# Patient Record
Sex: Female | Born: 1975 | Race: White | Hispanic: No | Marital: Married | Smoking: Never smoker
Health system: Southern US, Community
[De-identification: ages and names within clinical notes are randomized; demographics above are authoritative.]

## PROBLEM LIST (undated history)

## (undated) DIAGNOSIS — M5020 Other cervical disc displacement, unspecified cervical region: Secondary | ICD-10-CM

## (undated) DIAGNOSIS — F419 Anxiety disorder, unspecified: Secondary | ICD-10-CM

## (undated) DIAGNOSIS — G5601 Carpal tunnel syndrome, right upper limb: Secondary | ICD-10-CM

## (undated) DIAGNOSIS — F988 Other specified behavioral and emotional disorders with onset usually occurring in childhood and adolescence: Secondary | ICD-10-CM

## (undated) DIAGNOSIS — F32A Depression, unspecified: Secondary | ICD-10-CM

## (undated) DIAGNOSIS — L4050 Arthropathic psoriasis, unspecified: Secondary | ICD-10-CM

## (undated) DIAGNOSIS — F909 Attention-deficit hyperactivity disorder, unspecified type: Secondary | ICD-10-CM

## (undated) DIAGNOSIS — R011 Cardiac murmur, unspecified: Secondary | ICD-10-CM

## (undated) DIAGNOSIS — Z86718 Personal history of other venous thrombosis and embolism: Secondary | ICD-10-CM

## (undated) DIAGNOSIS — M5030 Other cervical disc degeneration, unspecified cervical region: Secondary | ICD-10-CM

## (undated) DIAGNOSIS — L309 Dermatitis, unspecified: Secondary | ICD-10-CM

## (undated) DIAGNOSIS — M47812 Spondylosis without myelopathy or radiculopathy, cervical region: Secondary | ICD-10-CM

## (undated) DIAGNOSIS — F329 Major depressive disorder, single episode, unspecified: Secondary | ICD-10-CM

## (undated) DIAGNOSIS — T8859XA Other complications of anesthesia, initial encounter: Secondary | ICD-10-CM

## (undated) DIAGNOSIS — E039 Hypothyroidism, unspecified: Secondary | ICD-10-CM

## (undated) DIAGNOSIS — J45990 Exercise induced bronchospasm: Secondary | ICD-10-CM

## (undated) DIAGNOSIS — M2560 Stiffness of unspecified joint, not elsewhere classified: Secondary | ICD-10-CM

## (undated) DIAGNOSIS — K219 Gastro-esophageal reflux disease without esophagitis: Secondary | ICD-10-CM

## (undated) DIAGNOSIS — T4145XA Adverse effect of unspecified anesthetic, initial encounter: Secondary | ICD-10-CM

## (undated) HISTORY — DX: Other specified behavioral and emotional disorders with onset usually occurring in childhood and adolescence: F98.8

## (undated) HISTORY — PX: AUGMENTATION MAMMAPLASTY: SUR837

## (undated) HISTORY — PX: COLONOSCOPY: SHX174

## (undated) HISTORY — DX: Depression, unspecified: F32.A

## (undated) HISTORY — DX: Anxiety disorder, unspecified: F41.9

## (undated) HISTORY — DX: Major depressive disorder, single episode, unspecified: F32.9

## (undated) HISTORY — DX: Gastro-esophageal reflux disease without esophagitis: K21.9

---
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
## 1997-07-08 HISTORY — PX: BREAST ENHANCEMENT SURGERY: SHX7

## 1999-05-01 ENCOUNTER — Other Ambulatory Visit: Admission: RE | Admit: 1999-05-01 | Discharge: 1999-05-01 | Payer: Self-pay | Admitting: Obstetrics and Gynecology

## 2000-06-27 ENCOUNTER — Ambulatory Visit (HOSPITAL_COMMUNITY): Admission: RE | Admit: 2000-06-27 | Discharge: 2000-06-27 | Payer: Self-pay | Admitting: Gastroenterology

## 2003-04-04 ENCOUNTER — Ambulatory Visit (HOSPITAL_COMMUNITY): Admission: RE | Admit: 2003-04-04 | Discharge: 2003-04-04 | Payer: Self-pay | Admitting: Gastroenterology

## 2003-11-17 ENCOUNTER — Other Ambulatory Visit: Admission: RE | Admit: 2003-11-17 | Discharge: 2003-11-17 | Payer: Self-pay | Admitting: Obstetrics and Gynecology

## 2005-03-07 ENCOUNTER — Other Ambulatory Visit: Admission: RE | Admit: 2005-03-07 | Discharge: 2005-03-07 | Payer: Self-pay | Admitting: Obstetrics and Gynecology

## 2005-03-07 ENCOUNTER — Ambulatory Visit: Payer: Self-pay | Admitting: Oncology

## 2008-04-21 ENCOUNTER — Encounter (INDEPENDENT_AMBULATORY_CARE_PROVIDER_SITE_OTHER): Payer: Self-pay | Admitting: *Deleted

## 2008-05-17 ENCOUNTER — Ambulatory Visit: Payer: Self-pay | Admitting: Family Medicine

## 2008-05-17 DIAGNOSIS — K219 Gastro-esophageal reflux disease without esophagitis: Secondary | ICD-10-CM | POA: Insufficient documentation

## 2008-05-17 DIAGNOSIS — F988 Other specified behavioral and emotional disorders with onset usually occurring in childhood and adolescence: Secondary | ICD-10-CM | POA: Insufficient documentation

## 2008-06-06 ENCOUNTER — Encounter: Payer: Self-pay | Admitting: Family Medicine

## 2008-07-05 ENCOUNTER — Telehealth (INDEPENDENT_AMBULATORY_CARE_PROVIDER_SITE_OTHER): Payer: Self-pay | Admitting: *Deleted

## 2008-07-08 DIAGNOSIS — Z86718 Personal history of other venous thrombosis and embolism: Secondary | ICD-10-CM

## 2008-07-08 HISTORY — DX: Personal history of other venous thrombosis and embolism: Z86.718

## 2008-07-11 ENCOUNTER — Ambulatory Visit: Payer: Self-pay | Admitting: Family Medicine

## 2008-07-11 DIAGNOSIS — G5600 Carpal tunnel syndrome, unspecified upper limb: Secondary | ICD-10-CM | POA: Insufficient documentation

## 2008-08-03 ENCOUNTER — Telehealth: Payer: Self-pay | Admitting: Family Medicine

## 2008-08-31 ENCOUNTER — Telehealth: Payer: Self-pay | Admitting: Family Medicine

## 2008-09-19 ENCOUNTER — Encounter: Payer: Self-pay | Admitting: Family Medicine

## 2008-09-19 ENCOUNTER — Telehealth: Payer: Self-pay | Admitting: Family Medicine

## 2008-09-19 DIAGNOSIS — N390 Urinary tract infection, site not specified: Secondary | ICD-10-CM | POA: Insufficient documentation

## 2008-09-19 LAB — CONVERTED CEMR LAB
Bilirubin Urine: NEGATIVE
Glucose, Urine, Semiquant: NEGATIVE
WBC Urine, dipstick: NEGATIVE

## 2008-09-21 ENCOUNTER — Telehealth: Payer: Self-pay | Admitting: Family Medicine

## 2008-09-21 ENCOUNTER — Encounter: Payer: Self-pay | Admitting: Family Medicine

## 2008-09-22 ENCOUNTER — Encounter: Payer: Self-pay | Admitting: Family Medicine

## 2008-10-17 ENCOUNTER — Encounter: Payer: Self-pay | Admitting: Family Medicine

## 2008-10-26 ENCOUNTER — Telehealth: Payer: Self-pay | Admitting: Family Medicine

## 2008-11-03 ENCOUNTER — Encounter: Payer: Self-pay | Admitting: Family Medicine

## 2008-11-17 ENCOUNTER — Telehealth: Payer: Self-pay | Admitting: Family Medicine

## 2008-12-26 ENCOUNTER — Encounter: Payer: Self-pay | Admitting: Family Medicine

## 2008-12-26 ENCOUNTER — Telehealth: Payer: Self-pay | Admitting: Family Medicine

## 2008-12-26 ENCOUNTER — Ambulatory Visit: Payer: Self-pay

## 2008-12-26 ENCOUNTER — Encounter: Payer: Self-pay | Admitting: Internal Medicine

## 2008-12-26 DIAGNOSIS — I82402 Acute embolism and thrombosis of unspecified deep veins of left lower extremity: Secondary | ICD-10-CM | POA: Insufficient documentation

## 2008-12-26 DIAGNOSIS — M79609 Pain in unspecified limb: Secondary | ICD-10-CM | POA: Insufficient documentation

## 2008-12-26 DIAGNOSIS — I82409 Acute embolism and thrombosis of unspecified deep veins of unspecified lower extremity: Secondary | ICD-10-CM | POA: Insufficient documentation

## 2008-12-26 LAB — CONVERTED CEMR LAB
HCT: 37.6 % (ref 36.0–46.0)
Hemoglobin: 13 g/dL (ref 12.0–15.0)
Homocysteine: 7.1 micromoles/L (ref 4.0–15.4)
MCHC: 34.6 g/dL (ref 30.0–36.0)
Platelets: 253 10*3/uL (ref 150–400)
Protein S Ag, Total: 87 % (ref 70–140)
RDW: 13.1 % (ref 11.5–15.5)

## 2008-12-27 ENCOUNTER — Ambulatory Visit: Payer: Self-pay | Admitting: Cardiology

## 2008-12-27 ENCOUNTER — Encounter: Payer: Self-pay | Admitting: Family Medicine

## 2008-12-27 ENCOUNTER — Ambulatory Visit: Payer: Self-pay | Admitting: Internal Medicine

## 2008-12-27 ENCOUNTER — Telehealth (INDEPENDENT_AMBULATORY_CARE_PROVIDER_SITE_OTHER): Payer: Self-pay | Admitting: *Deleted

## 2008-12-29 ENCOUNTER — Telehealth: Payer: Self-pay | Admitting: Family Medicine

## 2008-12-30 ENCOUNTER — Ambulatory Visit: Payer: Self-pay | Admitting: Cardiology

## 2008-12-30 DIAGNOSIS — I80292 Phlebitis and thrombophlebitis of other deep vessels of left lower extremity: Secondary | ICD-10-CM | POA: Insufficient documentation

## 2008-12-30 DIAGNOSIS — I80299 Phlebitis and thrombophlebitis of other deep vessels of unspecified lower extremity: Secondary | ICD-10-CM | POA: Insufficient documentation

## 2008-12-30 LAB — CONVERTED CEMR LAB: Prothrombin Time: 12 s (ref 10.9–13.3)

## 2009-01-03 ENCOUNTER — Telehealth: Payer: Self-pay | Admitting: Family Medicine

## 2009-01-03 ENCOUNTER — Telehealth (INDEPENDENT_AMBULATORY_CARE_PROVIDER_SITE_OTHER): Payer: Self-pay | Admitting: *Deleted

## 2009-01-18 ENCOUNTER — Encounter: Admission: RE | Admit: 2009-01-18 | Discharge: 2009-01-18 | Payer: Self-pay | Admitting: Cardiology

## 2009-02-02 ENCOUNTER — Telehealth: Payer: Self-pay | Admitting: Family Medicine

## 2009-02-15 ENCOUNTER — Encounter: Admission: RE | Admit: 2009-02-15 | Discharge: 2009-02-15 | Payer: Self-pay | Admitting: Cardiology

## 2009-02-16 ENCOUNTER — Telehealth: Payer: Self-pay | Admitting: Family Medicine

## 2009-02-22 ENCOUNTER — Telehealth (INDEPENDENT_AMBULATORY_CARE_PROVIDER_SITE_OTHER): Payer: Self-pay | Admitting: *Deleted

## 2009-02-23 ENCOUNTER — Encounter: Payer: Self-pay | Admitting: Family Medicine

## 2009-03-10 ENCOUNTER — Telehealth: Payer: Self-pay | Admitting: Family Medicine

## 2009-03-14 ENCOUNTER — Telehealth (INDEPENDENT_AMBULATORY_CARE_PROVIDER_SITE_OTHER): Payer: Self-pay | Admitting: *Deleted

## 2009-03-21 ENCOUNTER — Telehealth: Payer: Self-pay | Admitting: Family Medicine

## 2009-03-24 ENCOUNTER — Telehealth: Payer: Self-pay | Admitting: Family Medicine

## 2009-03-25 ENCOUNTER — Telehealth: Payer: Self-pay | Admitting: Family Medicine

## 2009-04-11 ENCOUNTER — Ambulatory Visit: Payer: Self-pay | Admitting: Family Medicine

## 2009-05-18 ENCOUNTER — Ambulatory Visit: Payer: Self-pay | Admitting: Family Medicine

## 2009-05-18 LAB — CONVERTED CEMR LAB
ALT: 14 units/L (ref 0–35)
Cholesterol: 211 mg/dL — ABNORMAL HIGH (ref 0–200)
HDL: 119.6 mg/dL (ref >39.00)
Total Protein: 7 g/dL (ref 6.0–8.3)
Triglycerides: 32 mg/dL (ref 0.0–149.0)
VLDL: 6.4 mg/dL (ref 0.0–40.0)

## 2009-06-21 ENCOUNTER — Encounter: Payer: Self-pay | Admitting: Family Medicine

## 2009-06-27 ENCOUNTER — Telehealth: Payer: Self-pay | Admitting: Family Medicine

## 2009-07-25 ENCOUNTER — Encounter: Payer: Self-pay | Admitting: Family Medicine

## 2009-08-28 ENCOUNTER — Ambulatory Visit: Payer: Self-pay | Admitting: Family Medicine

## 2009-08-28 DIAGNOSIS — J019 Acute sinusitis, unspecified: Secondary | ICD-10-CM | POA: Insufficient documentation

## 2009-09-01 ENCOUNTER — Encounter: Payer: Self-pay | Admitting: Family Medicine

## 2009-09-01 ENCOUNTER — Encounter: Admission: RE | Admit: 2009-09-01 | Discharge: 2009-09-01 | Payer: Self-pay | Admitting: Cardiology

## 2009-09-05 ENCOUNTER — Encounter: Admission: RE | Admit: 2009-09-05 | Discharge: 2009-09-05 | Payer: Self-pay | Admitting: Cardiology

## 2009-09-16 ENCOUNTER — Encounter: Payer: Self-pay | Admitting: Family Medicine

## 2009-09-19 ENCOUNTER — Telehealth: Payer: Self-pay | Admitting: Family Medicine

## 2009-10-19 ENCOUNTER — Telehealth: Payer: Self-pay | Admitting: Family Medicine

## 2009-10-30 ENCOUNTER — Encounter: Payer: Self-pay | Admitting: Family Medicine

## 2009-11-15 ENCOUNTER — Telehealth: Payer: Self-pay | Admitting: Family Medicine

## 2009-12-22 ENCOUNTER — Telehealth: Payer: Self-pay | Admitting: Family Medicine

## 2010-01-22 ENCOUNTER — Telehealth: Payer: Self-pay | Admitting: Family Medicine

## 2010-02-05 ENCOUNTER — Ambulatory Visit: Payer: Self-pay | Admitting: Cardiology

## 2010-02-13 ENCOUNTER — Telehealth: Payer: Self-pay | Admitting: Family Medicine

## 2010-02-14 ENCOUNTER — Ambulatory Visit: Payer: Self-pay | Admitting: Family Medicine

## 2010-03-15 ENCOUNTER — Telehealth: Payer: Self-pay | Admitting: Family Medicine

## 2010-04-17 ENCOUNTER — Telehealth: Payer: Self-pay | Admitting: Family Medicine

## 2010-05-09 ENCOUNTER — Telehealth: Payer: Self-pay | Admitting: Family Medicine

## 2010-05-11 ENCOUNTER — Telehealth (INDEPENDENT_AMBULATORY_CARE_PROVIDER_SITE_OTHER): Payer: Self-pay | Admitting: Physician Assistant

## 2010-06-04 ENCOUNTER — Telehealth: Payer: Self-pay | Admitting: Family Medicine

## 2010-06-20 ENCOUNTER — Encounter: Payer: Self-pay | Admitting: Family Medicine

## 2010-07-06 ENCOUNTER — Telehealth: Payer: Self-pay | Admitting: Family Medicine

## 2010-07-13 ENCOUNTER — Ambulatory Visit
Admission: RE | Admit: 2010-07-13 | Discharge: 2010-07-13 | Payer: Self-pay | Source: Home / Self Care | Attending: Family Medicine | Admitting: Family Medicine

## 2010-07-13 ENCOUNTER — Telehealth: Payer: Self-pay | Admitting: Family Medicine

## 2010-07-30 ENCOUNTER — Telehealth: Payer: Self-pay | Admitting: Family Medicine

## 2010-08-05 LAB — CONVERTED CEMR LAB
HCT: 38.4 % (ref 36.0–46.0)
Hemoglobin: 13 g/dL (ref 12.0–15.0)
MCHC: 33.8 g/dL (ref 30.0–36.0)
MCV: 91.6 fL (ref 78.0–100.0)
RBC: 4.19 M/uL (ref 3.87–5.11)
RDW: 13.2 % (ref 11.5–15.5)
WBC: 7.8 10*3/uL (ref 4.0–10.5)

## 2010-08-07 NOTE — Progress Notes (Signed)
  Phone Note Call from Patient   Reason for Call: Refill Medication Summary of Call: pt needs vyvanse Initial call taken by: Loreen Freud DO,  January 22, 2010 10:23 AM    Prescriptions: VYVANSE 70 MG CAPS (LISDEXAMFETAMINE DIMESYLATE) 1 by mouth qam  #30 x 0   Entered and Authorized by:   Loreen Freud DO   Signed by:   Loreen Freud DO on 01/22/2010   Method used:   Print then Give to Patient   RxID:   [id number]

## 2010-08-07 NOTE — Letter (Signed)
Summary: [facility]  [facility]   Imported By: Lanelle Bal 11/14/2009 08:04:03  _____________________________________________________________________  External Attachment:    Type:   Image     Comment:   External Document

## 2010-08-07 NOTE — Progress Notes (Signed)
  Phone Note Call from Patient   Caller: Patient Call For: Loreen Freud DO Reason for Call: Refill Medication Summary of Call: PT NEEDS REFILL VYVANSE Initial call taken by: Loreen Freud DO,  September 19, 2009 8:51 AM  Follow-up for Phone Call        DONE-- PT WILL PICK UP TODAY IN BACK Follow-up by: Loreen Freud DO,  September 19, 2009 8:51 AM    Prescriptions: VYVANSE 70 MG CAPS (LISDEXAMFETAMINE DIMESYLATE) 1 by mouth qam  #30 x 0   Entered and Authorized by:   Loreen Freud DO   Signed by:   Loreen Freud DO on 09/19/2009   Method used:   Print then Give to Patient   RxID:   [id number]

## 2010-08-07 NOTE — Miscellaneous (Signed)
Summary: Orders Update  Clinical Lists Changes  Medications: Added new medication of HYDROQUINONE 4 % CREA (HYDROQUINONE) apply two times a day - Signed Rx of HYDROQUINONE 4 % CREA (HYDROQUINONE) apply two times a day;  #30g x 5;  Signed;  Entered by: Loreen Freud DO;  Authorized by: Loreen Freud DO;  Method used: Electronically to [facility]*, [address], [address], [place], [place]  [address], Ph: [phone], Fax: [phone]    Prescriptions: HYDROQUINONE 4 % CREA (HYDROQUINONE) apply two times a day  #30g x 5   Entered and Authorized by:   Loreen Freud DO   Signed by:   Loreen Freud DO on 09/16/2009   Method used:   Electronically to        [facility]* (retail)       [address]       [address]       [place], [place]  [address]       Ph: [phone]       Fax: [phone]   RxID:   [id number]

## 2010-08-07 NOTE — Progress Notes (Signed)
  Phone Note Call from Patient   Caller: Patient Call For: Loreen Freud DO Reason for Call: Refill Medication Summary of Call: pt need med refill  Initial call taken by: Loreen Freud DO,  May 09, 2010 11:02 AM    New/Updated Medications: VYVANSE 70 MG CAPS (LISDEXAMFETAMINE DIMESYLATE) 1 by mouth once daily Prescriptions: VYVANSE 70 MG CAPS (LISDEXAMFETAMINE DIMESYLATE) 1 by mouth once daily  #30 x 0   Entered and Authorized by:   Loreen Freud DO   Signed by:   Loreen Freud DO on 05/09/2010   Method used:   Print then Give to Patient   RxID:   [id number] ADDERALL XR 30 MG XR24H-CAP (AMPHETAMINE-DEXTROAMPHETAMINE) 1 by mouth qam  #30 x 0   Entered and Authorized by:   Loreen Freud DO   Signed by:   Loreen Freud DO on 05/09/2010   Method used:   Print then Give to Patient   RxID:   [id number]

## 2010-08-07 NOTE — Progress Notes (Signed)
  Phone Note Call from Patient   Caller: Patient Call For: Loreen Freud DO Reason for Call: Refill Medication Summary of Call: pt needs med refill Initial call taken by: Loreen Freud DO,  April 17, 2010 10:54 AM  Follow-up for Phone Call        adderall xr 30 1 by mouth once daily  Follow-up by: Loreen Freud DO,  April 17, 2010 10:57 AM    New/Updated Medications: ADDERALL XR 30 MG XR24H-CAP (AMPHETAMINE-DEXTROAMPHETAMINE) 1 by mouth qam Prescriptions: ADDERALL XR 30 MG XR24H-CAP (AMPHETAMINE-DEXTROAMPHETAMINE) 1 by mouth qam  #30 x 0   Entered and Authorized by:   Loreen Freud DO   Signed by:   Loreen Freud DO on 04/17/2010   Method used:   Print then Give to Patient   RxID:   [id number]

## 2010-08-07 NOTE — Assessment & Plan Note (Signed)
Summary: sinus infection/drb      Vital Signs:  Patient profile:   35 year old female Height:      64 inches Weight:      127 pounds BMI:     21.88 Temp:     98.3 degrees F oral BP sitting:   102 / 70  (left arm) Cuff size:   regular  Vitals Entered By: Army Fossa CMA (August 28, 2009 9:23 AM) CC: Pt c/o possible sinus infections, ears feel clogged, headache, nasal congestion, URI symptoms   History of Present Illness:       This is a 35 year old woman who presents with URI symptoms.  The symptoms began 1 week ago.  The patient complains of nasal congestion, purulent nasal discharge, sore throat, earache, and sick contacts, but denies clear nasal discharge, dry cough, and productive cough.  The patient denies fever, low-grade fever (<100.5 degrees), fever of 100.5-103 degrees, fever of 103.1-104 degrees, fever to >104 degrees, stiff neck, dyspnea, wheezing, rash, vomiting, diarrhea, use of an antipyretic, and response to antipyretic.  The patient also reports headache.  The patient denies itchy watery eyes, itchy throat, sneezing, seasonal symptoms, response to antihistamine, muscle aches, and severe fatigue.  The patient denies the following risk factors for Strep sinusitis: unilateral facial pain, unilateral nasal discharge, poor response to decongestant, double sickening, tooth pain, Strep exposure, tender adenopathy, and absence of cough.  B/L sinus pressure---front and max  Current Medications (verified): 1)  Vyvanse 70 Mg Caps (Lisdexamfetamine Dimesylate) .[name].. 1 By Mouth Qam 2)  Zegerid 40-1100 Mg Caps (Omeprazole-Sodium Bicarbonate) .... One Capsule Daily 3)  Urelle 81 Mg Tabs (Meth-Hyo-M Bl-Na Phos-Ph Sal) .... As Directed 4)  Lexapro 10 Mg Tabs (Escitalopram Oxalate) .[name].. 1 By Mouth Once Daily 5)  Warfarin Sodium 5 Mg Tabs (Warfarin Sodium) .... Use As Directed By Anticoagulation Clinic ([facility]) 6)  Allegra 180 Mg Tabs (Fexofenadine Hcl) .[name].. 1 By Mouth Once Daily As  Needed 7)  Valtrex 1 Gm Tabs (Valacyclovir Hcl) .... As Directed 8)  Veramyst 27.5 Mcg/spray Susp (Fluticasone Furoate) .... 2 Sprays Each Nostril Once Daily 9)  Lunesta 3 Mg Tabs (Eszopiclone) .[name].. 1 By Mouth At Bedtime As Needed 10)  Retin-A 0.1 % Crea (Tretinoin) .... Apply At Bedtime 11)  Diflucan 150 Mg Tabs (Fluconazole) .[name].. 1 By Mouth Once Daily --May Repeat in 3 Days Prn 12)  Augmentin 875-125 Mg Tabs (Amoxicillin-Pot Clavulanate) .[name].. 1 By Mouth Two Times A Day 13)  Mirena 20 Mcg/24hr Iud (Levonorgestrel) 14)  Prednisone 10 Mg Tabs (Prednisone) .... 3 By Mouth Once Daily For 3 Days Then 2 By Mouth Once Daily For 3 Days Then  1 By Mouth Once Daily For 3 Days  Allergies (verified): No Known Drug Allergies  Past History:  Past medical, surgical, family and social histories (including risk factors) reviewed for relevance to current acute and chronic problems.  Past Medical History: Reviewed history from 05/17/2008 and no changes required. GERD, hx hiatal hernia, hx of  Past Surgical History: Reviewed history from 05/17/2008 and no changes required. NONE  Family History: Reviewed history from 05/17/2008 and no changes required. Mother side: heart trouble, htn Father's side: cancer, colon cancer and breast cancer  Social History: Reviewed history from 05/17/2008 and no changes required. 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   Nose:  L frontal sinus tenderness, L maxillary sinus tenderness, R frontal sinus tenderness, and R maxillary sinus tenderness.   Mouth:  pharyngeal erythema and postnasal drip.   Neck:  cervical lymphadenopathy.   Lungs:  Normal respiratory effort, chest expands symmetrically. Lungs are  clear to auscultation, no crackles or wheezes. Heart:  Normal rate and regular rhythm. S1 and S2 normal without gallop, murmur, click, rub or other extra sounds. Extremities:  No clubbing, cyanosis, edema, or deformity noted with normal full range of motion of all joints.   Skin:  Intact without suspicious lesions or rashes Psych:  Oriented X3 and normally interactive.     Impression & Recommendations:  Problem # 1:  SINUSITIS - ACUTE-NOS (ICD-461.9)  Her updated medication list for this problem includes:    Veramyst 27.5 Mcg/spray Susp (Fluticasone furoate) .[name]... 2 sprays each nostril once daily    Augmentin 875-125 Mg Tabs (Amoxicillin-pot clavulanate) .[name]... 1 by mouth two times a day  Instructed on treatment. Call if symptoms persist or worsen.   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 1 by mouth qam 2)  Zegerid 40-1100 Mg Caps (Omeprazole-sodium bicarbonate) .... One capsule daily 3)  Urelle 81 Mg Tabs (Meth-hyo-m bl-na phos-ph sal) .... As directed 4)  Lexapro 10 Mg Tabs (Escitalopram oxalate) .[name].. 1 by mouth once daily 5)  Warfarin Sodium 5 Mg Tabs (Warfarin sodium) .... Use as directed by anticoagulation clinic ([facility]) 6)  Allegra 180 Mg Tabs (Fexofenadine hcl) .[name].. 1 by mouth once daily as needed 7)  Valtrex 1 Gm Tabs (Valacyclovir hcl) .... As directed 8)  Veramyst 27.5 Mcg/spray Susp (Fluticasone furoate) .... 2 sprays each nostril once daily 9)  Lunesta 3 Mg Tabs (Eszopiclone) .[name].. 1 by mouth at bedtime as needed 10)  Retin-a 0.1 % Crea (Tretinoin) .... Apply at bedtime 11)  Diflucan 150 Mg Tabs (Fluconazole) .[name].. 1 by mouth once daily --may repeat in 3 days prn 12)  Augmentin 875-125 Mg Tabs (Amoxicillin-pot clavulanate) .[name].. 1 by mouth two times a day 13)  Mirena 20  Mcg/24hr Iud (Levonorgestrel) 14)  Prednisone 10 Mg Tabs (Prednisone) .... 3 by mouth once daily for 3 days then 2 by mouth once daily for 3 days then  1 by mouth once daily for 3 days Prescriptions: VYVANSE 70 MG CAPS (LISDEXAMFETAMINE DIMESYLATE) 1 by mouth qam  #30 x 0   Entered and Authorized by:   Loreen Freud DO   Signed by:   Loreen Freud DO on 08/28/2009   Method used:   Print then Give to Patient   RxID:   [id number] PREDNISONE 10 MG TABS (PREDNISONE) 3 by mouth once daily for 3 days then 2 by mouth once daily for 3 days then  1 by mouth once daily for 3 days  #18 x 0   Entered and Authorized by:   Loreen Freud DO   Signed by:   Loreen Freud DO on 08/28/2009   Method used:   Print then Give to Patient   RxID:   [id number] AUGMENTIN 875-125 MG TABS (AMOXICILLIN-POT CLAVULANATE) 1 by mouth two times a day  #20 x 0   Entered and Authorized by:   Loreen Freud DO   Signed  by:   Loreen Freud DO on 08/28/2009   Method used:   Electronically to        [facility]* (retail)       [address]       [address]       [place], [place]  [address]       Ph: [phone]       Fax: [phone]   RxID:   [id number] DIFLUCAN 150 MG TABS (FLUCONAZOLE) 1 by mouth once daily --may repeat in 3 days prn  #2 x 2   Entered and Authorized by:   Loreen Freud DO   Signed by:   Loreen Freud DO on 08/28/2009   Method used:   Electronically to        [facility]* (retail)       [address]       [address]       [place], [place]  [address]       Ph: [phone]       Fax: [phone]   RxID:   [id number]    Medication Administration  Injection # 1:    Medication: Depo- Medrol 80mg     Diagnosis: SINUSITIS - ACUTE-NOS (ICD-461.9)    Route: IM    Site: RUOQ gluteus    Exp Date: 08/2010    Lot #: Madelaine Bhat    Mfr: novaplus    Patient tolerated injection without complications    Given by: Army Fossa CMA (August 28, 2009 9:51  AM)  Orders Added: 1)  Est. Patient Level III [88416] 2)  Admin of Therapeutic Inj  intramuscular or subcutaneous [96372] 3)  Depo- Medrol 80mg  [J1040]

## 2010-08-07 NOTE — Progress Notes (Signed)
  Phone Note Call from Patient   Call For: Dr Ronny Flurry Reason for Call: Lab or Test Results Summary of Call: Ms Elem checked her INR on home machine and it was 1.7. She takes 10mg  5 days/week and 7.5 2 days/week (Thur/Sun). She has a Hx of DVT in 2010 but not on most recent LE Dopplers 3-11. She is not having Sx of SOB/CP. LE is mildly swollen but not painfull. Advised her to take 10mg  daily except 7.5 on Thurs. She will check INR on Tues am and call results. Call back or call 911 for Sx. Initial call taken by: Park Breed PA-C,  May 11, 2010 7:11 PM

## 2010-08-07 NOTE — Progress Notes (Signed)
  Phone Note Call from Patient   Caller: Patient Call For: Loreen Freud DO Reason for Call: Refill Medication Summary of Call: refill   Follow-up for Phone Call        refill vyvanse Follow-up by: Loreen Freud DO,  June 04, 2010 10:13 AM    Prescriptions: VYVANSE 70 MG CAPS (LISDEXAMFETAMINE DIMESYLATE) 1 by mouth once daily  #30 x 0   Entered and Authorized by:   Loreen Freud DO   Signed by:   Loreen Freud DO on 06/04/2010   Method used:   Print then Give to Patient   RxID:   [id number]

## 2010-08-07 NOTE — Assessment & Plan Note (Signed)
Summary: med check/cbs   Vital Signs:  Patient profile:   35 year old female Height:      64 inches Weight:      126 pounds BMI:     21.71 Temp:     98.9 degrees F oral Pulse rate:   76 / minute BP sitting:   120 / 78  (left arm)  Vitals Entered By: Jeremy Johann CMA (February 14, 2010 10:05 AM) CC: MED CHECK   History of Present Illness: Pt here for ADD f/u.  Pt would like to switch back  to ritalin because vyvanse no longer works.  No other complaints.    Current Medications (verified): 1)  Ritalin 20 Mg Tabs (Methylphenidate Hcl) .[name].. 1 By Mouth Two Times A Day 2)  Zegerid 40-1100 Mg Caps (Omeprazole-Sodium Bicarbonate) .... One Capsule Daily 3)  Urelle 81 Mg Tabs (Meth-Hyo-M Bl-Na Phos-Ph Sal) .... As Directed 4)  Lexapro 10 Mg Tabs (Escitalopram Oxalate) .[name].. 1 By Mouth Once Daily 5)  Warfarin Sodium 5 Mg Tabs (Warfarin Sodium) .... Use As Directed By Anticoagulation Clinic ([facility]) 6)  Allegra 180 Mg Tabs (Fexofenadine Hcl) .[name].. 1 By Mouth Once Daily As Needed 7)  Valtrex 1 Gm Tabs (Valacyclovir Hcl) .... As Directed 8)  Veramyst 27.5 Mcg/spray Susp (Fluticasone Furoate) .... 2 Sprays Each Nostril Once Daily 9)  Lunesta 3 Mg Tabs (Eszopiclone) .[name].. 1 By Mouth At Bedtime As Needed 10)  Retin-A 0.1 % Crea (Tretinoin) .... Apply At Bedtime 11)  Diflucan 150 Mg Tabs (Fluconazole) .[name].. 1 By Mouth Once Daily --May Repeat in 3 Days Prn 12)  Mirena 20 Mcg/24hr Iud (Levonorgestrel) 13)  Hydroquinone 4 % Crea (Hydroquinone) .... Apply Two Times A Day 14)  Xanax 0.25 Mg Tabs (Alprazolam) .[name].. 1 By Mouth Three Times A Day As Needed  Allergies (verified): No Known Drug Allergies  Past History:  Past medical, surgical, family and social histories (including risk factors) reviewed for relevance to current acute and chronic problems.  Past Medical History: Reviewed history from 05/17/2008 and no changes required. GERD, hx hiatal hernia, hx of  Past Surgical History: Reviewed  history from 05/17/2008 and no changes required. NONE  Family History: Reviewed history from 05/17/2008 and no changes required. Mother side: heart trouble, htn Father's side: cancer, colon cancer and breast cancer  Social History: Reviewed history from 05/17/2008 and no changes required. 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 Lungs are clear to auscultation, no crackles or wheezes. Heart:  normal rate and no murmur.   Extremities:  No clubbing, cyanosis, edema, or deformity noted with normal full range of motion of all joints.   Skin:  Intact without suspicious lesions or rashes Psych:  Oriented X3 and normally interactive.     Impression & Recommendations:  Problem # 1:  ADD (ICD-314.00) change to ritalin  rto 6 months   Complete Medication List: 1)  Ritalin 20 Mg Tabs (Methylphenidate hcl) .[name].. 1 by mouth two times a day 2)  Zegerid 40-1100 Mg Caps (Omeprazole-sodium bicarbonate) .... One capsule daily 3)  Urelle 81 Mg Tabs (Meth-hyo-m bl-na phos-ph sal) .... As directed 4)  Lexapro 10 Mg Tabs (Escitalopram oxalate) .[name].. 1 by mouth once daily 5)  Warfarin Sodium 5 Mg Tabs (Warfarin sodium) .... Use  as directed by anticoagulation clinic ([facility]) 6)  Allegra 180 Mg Tabs (Fexofenadine hcl) .[name].. 1 by mouth once daily as needed 7)  Valtrex 1 Gm Tabs (Valacyclovir hcl) .... As directed 8)  Veramyst 27.5 Mcg/spray Susp (Fluticasone furoate) .... 2 sprays each nostril once daily 9)  Lunesta 3 Mg Tabs (Eszopiclone) .[name].. 1 by mouth at bedtime as needed 10)  Retin-a 0.1 % Crea (Tretinoin) .... Apply at bedtime 11)  Diflucan 150 Mg Tabs (Fluconazole) .[name].. 1 by mouth once daily --may repeat in 3 days  prn 12)  Mirena 20 Mcg/24hr Iud (Levonorgestrel) 13)  Hydroquinone 4 % Crea (Hydroquinone) .... Apply two times a day 14)  Xanax 0.25 Mg Tabs (Alprazolam) .[name].. 1 by mouth three times a day as needed

## 2010-08-07 NOTE — Letter (Signed)
Summary: [facility]  [facility]   Imported By: Lanelle Bal 07/31/2009 09:14:41  _____________________________________________________________________  External Attachment:    Type:   Image     Comment:   External Document

## 2010-08-07 NOTE — Miscellaneous (Signed)
Summary: Orders Update  Clinical Lists Changes  Medications: Rx of VYVANSE 70 MG CAPS (LISDEXAMFETAMINE DIMESYLATE) 1 by mouth qam;  #30 x 0;  Signed;  Entered by: Loreen Freud DO;  Authorized by: Loreen Freud DO;  Method used: Print then Give to Patient    Prescriptions: VYVANSE 70 MG CAPS (LISDEXAMFETAMINE DIMESYLATE) 1 by mouth qam  #30 x 0   Entered and Authorized by:   Loreen Freud DO   Signed by:   Loreen Freud DO on 07/25/2009   Method used:   Print then Give to Patient   RxID:   [id number]

## 2010-08-07 NOTE — Progress Notes (Signed)
  Phone Note Call from Patient   Caller: Patient Call For: Loreen Freud DO Reason for Call: Refill Medication Summary of Call: pt would like to switch to ritalin 20mg  two times a day  --- pt has appointment in am  Initial call taken by: Loreen Freud DO,  February 13, 2010 2:07 PM    New/Updated Medications: RITALIN 20 MG TABS (METHYLPHENIDATE HCL) 1 by mouth two times a day Prescriptions: RITALIN 20 MG TABS (METHYLPHENIDATE HCL) 1 by mouth two times a day  #60 x 0   Entered and Authorized by:   Loreen Freud DO   Signed by:   Loreen Freud DO on 02/13/2010   Method used:   Print then Give to Patient   RxID:   [id number]

## 2010-08-07 NOTE — Progress Notes (Signed)
  Phone Note Call from Patient   Caller: Patient Call For: Loreen Freud DO Reason for Call: Refill Medication Summary of Call: Needs vyvanse Initial call taken by: Loreen Freud DO,  Nov 15, 2009 9:29 AM  Follow-up for Phone Call        done Follow-up by: Loreen Freud DO,  Nov 15, 2009 9:29 AM    Prescriptions: VYVANSE 70 MG CAPS (LISDEXAMFETAMINE DIMESYLATE) 1 by mouth qam  #30 x 0   Entered and Authorized by:   Loreen Freud DO   Signed by:   Loreen Freud DO on 11/15/2009   Method used:   Print then Give to Patient   RxID:   [id number]

## 2010-08-07 NOTE — Progress Notes (Signed)
  Phone Note Refill Request    Follow-up for Phone Call        pt did not like ritalin--- adderall xr 30 xr  #30 1 by mouth once daily   Follow-up by: Loreen Freud DO,  March 15, 2010 11:43 AM    New/Updated Medications: ADDERALL XR 30 MG XR24H-CAP (AMPHETAMINE-DEXTROAMPHETAMINE) 1 by mouth once daily Prescriptions: ADDERALL XR 30 MG XR24H-CAP (AMPHETAMINE-DEXTROAMPHETAMINE) 1 by mouth once daily  #30 x 0   Entered and Authorized by:   Loreen Freud DO   Signed by:   Loreen Freud DO on 03/15/2010   Method used:   Print then Give to Patient   RxID:   [id number]

## 2010-08-07 NOTE — Progress Notes (Signed)
  Phone Note Call from Patient   Reason for Call: Refill Medication Summary of Call: pt needs refill vyvanse pt also under a lot of stress with parent's divorce ----xanax 0.25 three times a day as needed  Initial call taken by: Loreen Freud DO,  October 19, 2009 2:42 PM    New/Updated Medications: XANAX 0.25 MG TABS (ALPRAZOLAM) 1 by mouth three times a day as needed Prescriptions: XANAX 0.25 MG TABS (ALPRAZOLAM) 1 by mouth three times a day as needed  #60 x 0   Entered and Authorized by:   Loreen Freud DO   Signed by:   Loreen Freud DO on 10/19/2009   Method used:   Print then Give to Patient   RxID:   [id number] VYVANSE 70 MG CAPS (LISDEXAMFETAMINE DIMESYLATE) 1 by mouth qam  #30 x 0   Entered and Authorized by:   Loreen Freud DO   Signed by:   Loreen Freud DO on 10/19/2009   Method used:   Print then Give to Patient   RxID:   [id number]

## 2010-08-07 NOTE — Progress Notes (Signed)
  Phone Note Call from Patient   Caller: Patient Call For: Loreen Freud DO Summary of Call: refill on vyvanse Initial call taken by: Loreen Freud DO,  December 22, 2009 10:29 AM  Follow-up for Phone Call        printed and given to pt  Follow-up by: Loreen Freud DO,  December 22, 2009 10:31 AM    Prescriptions: VYVANSE 70 MG CAPS (LISDEXAMFETAMINE DIMESYLATE) 1 by mouth qam  #30 x 0   Entered and Authorized by:   Loreen Freud DO   Signed by:   Loreen Freud DO on 12/22/2009   Method used:   Print then Give to Patient   RxID:   [id number]

## 2010-08-09 NOTE — Progress Notes (Signed)
  Phone Note Call from Patient   Caller: Patient Call For: Dr Laury Axon Summary of Call: pt needs refill on med and would like Lovaza for health reasons.  She realizes her insurance will probably not pay for it.   Initial call taken by: Loreen Freud DO,  July 30, 2010 10:13 AM    New/Updated Medications: LOVAZA 1 GM CAPS (OMEGA-3-ACID ETHYL ESTERS) 2 by mouth two times a day Prescriptions: LOVAZA 1 GM CAPS (OMEGA-3-ACID ETHYL ESTERS) 2 by mouth two times a day  #120 x 5   Entered and Authorized by:   Loreen Freud DO   Signed by:   Loreen Freud DO on 07/30/2010   Method used:   Electronically to        [facility]* (retail)       [address]       [address]       [place], [place]  [address]       Ph: [phone]       Fax: [phone]   RxID:   [id number] VYVANSE 70 MG CAPS (LISDEXAMFETAMINE DIMESYLATE) 1 by mouth once daily  #30 x 0   Entered and Authorized by:   Loreen Freud DO   Signed by:   Loreen Freud DO on 07/30/2010   Method used:   Print then Give to Patient   RxID:   [id number]

## 2010-08-09 NOTE — Progress Notes (Signed)
  Phone Note Call from Patient   Caller: Patient Call For: Loreen Freud DO Reason for Call: Refill Medication Summary of Call: refill vyvanse--pt will schedule ov Initial call taken by: Loreen Freud DO,  July 06, 2010 2:17 PM  Follow-up for Phone Call        printed---pt to schedule appointment Follow-up by: Loreen Freud DO,  July 06, 2010 2:18 PM    Prescriptions: VYVANSE 70 MG CAPS (LISDEXAMFETAMINE DIMESYLATE) 1 by mouth once daily  #30 x 0   Entered and Authorized by:   Loreen Freud DO   Signed by:   Loreen Freud DO on 07/06/2010   Method used:   Print then Give to Patient   RxID:   [id number]

## 2010-08-09 NOTE — Assessment & Plan Note (Signed)
Summary: FLU SHOT//kp  Nurse Visit   Allergies: No Known Drug Allergies  Immunizations Administered:  Influenza Vaccine # 1:    Vaccine Type: Fluvax MCR    Site: right deltoid    Mfr: Sanofi Pasteur    Dose: 0.5 ml    Route: IM    Given by: Almeta Monas CMA (AAMA)    Exp. Date: 01/05/2011    Lot #: [id number]    VIS given: 01/30/10 version given July 13, 2010.  Flu Vaccine Consent Questions:    Do you have a history of severe allergic reactions to this vaccine? no    Any prior history of allergic reactions to egg and/or gelatin? no    Do you have a sensitivity to the preservative Thimersol? no    Do you have a past history of Guillan-Barre Syndrome? no    Do you currently have an acute febrile illness? no    Have you ever had a severe reaction to latex? no    Vaccine information given and explained to patient? yes    Are you currently pregnant? no  Orders Added: 1)  Influenza Vaccine MCR [00025]

## 2010-08-09 NOTE — Progress Notes (Signed)
  Phone Note Call from Patient   Caller: Patient Call For: lowne Summary of Call: Pt needs flu shot for work---pt wants to know if we have any more.  I told her it was ok to come in and get one as long as we still had them. Initial call taken by: Loreen Freud DO,  July 13, 2010 1:14 PM  Follow-up for Phone Call        DONE.... Almeta Monas CMA Duncan Dull)  July 13, 2010 1:52 PM

## 2010-08-15 ENCOUNTER — Encounter (INDEPENDENT_AMBULATORY_CARE_PROVIDER_SITE_OTHER): Payer: Managed Care, Other (non HMO) | Admitting: Family Medicine

## 2010-08-15 ENCOUNTER — Encounter: Payer: Self-pay | Admitting: Family Medicine

## 2010-08-15 ENCOUNTER — Other Ambulatory Visit: Payer: Self-pay | Admitting: Family Medicine

## 2010-08-15 DIAGNOSIS — Z978 Presence of other specified devices: Secondary | ICD-10-CM | POA: Insufficient documentation

## 2010-08-15 DIAGNOSIS — E785 Hyperlipidemia, unspecified: Secondary | ICD-10-CM

## 2010-08-15 DIAGNOSIS — I82409 Acute embolism and thrombosis of unspecified deep veins of unspecified lower extremity: Secondary | ICD-10-CM

## 2010-08-15 DIAGNOSIS — J301 Allergic rhinitis due to pollen: Secondary | ICD-10-CM

## 2010-08-15 DIAGNOSIS — Z0000 Encounter for general adult medical examination without abnormal findings: Secondary | ICD-10-CM

## 2010-08-15 DIAGNOSIS — F988 Other specified behavioral and emotional disorders with onset usually occurring in childhood and adolescence: Secondary | ICD-10-CM

## 2010-08-15 LAB — BASIC METABOLIC PANEL
BUN: 14 mg/dL (ref 6–23)
Creatinine, Ser: 0.7 mg/dL (ref 0.4–1.2)
Glucose, Bld: 113 mg/dL — ABNORMAL HIGH (ref 70–99)
Sodium: 138 mEq/L (ref 135–145)

## 2010-08-15 LAB — CBC WITH DIFFERENTIAL/PLATELET
Basophils Absolute: 0 10*3/uL (ref 0.0–0.1)
Eosinophils Absolute: 0.1 10*3/uL (ref 0.0–0.7)
Eosinophils Relative: 1.7 % (ref 0.0–5.0)
Hemoglobin: 15 g/dL (ref 12.0–15.0)
Monocytes Absolute: 0.7 10*3/uL (ref 0.1–1.0)
Neutrophils Relative %: 62.4 % (ref 43.0–77.0)
Platelets: 277 10*3/uL (ref 150.0–400.0)
RBC: 4.67 Mil/uL (ref 3.87–5.11)
RDW: 12.9 % (ref 11.5–14.6)
WBC: 5.3 10*3/uL (ref 4.5–10.5)

## 2010-08-15 LAB — CONVERTED CEMR LAB
Blood in Urine, dipstick: NEGATIVE
Ketones, urine, test strip: NEGATIVE
Nitrite: NEGATIVE
Protein, U semiquant: NEGATIVE
Urobilinogen, UA: NEGATIVE
pH: 5

## 2010-08-15 LAB — LIPID PANEL
Cholesterol: 254 mg/dL — ABNORMAL HIGH (ref 0–200)
Total CHOL/HDL Ratio: 2
Triglycerides: 40 mg/dL (ref 0.0–149.0)

## 2010-08-15 LAB — HEPATIC FUNCTION PANEL
AST: 26 U/L (ref 0–37)
Albumin: 4.3 g/dL (ref 3.5–5.2)
Alkaline Phosphatase: 60 U/L (ref 39–117)
Bilirubin, Direct: 0.1 mg/dL (ref 0.0–0.3)
Total Bilirubin: 0.5 mg/dL (ref 0.3–1.2)

## 2010-08-23 NOTE — Assessment & Plan Note (Signed)
Summary: cpx/pt will be fasting/kn   Vital Signs:  Patient profile:   35 year old female Height:      64 inches Weight:      123.8 pounds BMI:     21.33 Pulse rate:   68 / minute Pulse rhythm:   regular BP sitting:   122 / 82  (right arm) Cuff size:   regular  Vitals Entered By: Almeta Monas CMA Duncan Dull) (August 15, 2010 8:47 AM) CC: CPX/Fasting--No pap   History of Present Illness: Pt here for cpe and labs.  Pt only complaint is some hot flashes on occassion.     Preventive Screening-Counseling & Management  Alcohol-Tobacco     Alcohol drinks/day: 3     Alcohol type: wine     Smoking Status: never     Passive Smoke Exposure: no  Caffeine-Diet-Exercise     Caffeine use/day: 0     Does Patient Exercise: yes     Type of exercise: walk     Times/week: 2  Hep-HIV-STD-Contraception     HIV Risk: no     Dental Visit-last 6 months yes     Dental Care Counseling: not indicated; dental care within six months     Sun Exposure-Excessive: occasionally  Safety-Violence-Falls     Seat Belt Use: 100      Sexual History:  currently monogamous.    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 One Capsule Daily 2)  Urelle 81 Mg Tabs (Meth-Hyo-M Bl-Na Phos-Ph Sal) .... As Directed 3)  Lexapro 10 Mg Tabs (Escitalopram Oxalate) .[name].. 1 By Mouth Once Daily 4)  Warfarin Sodium 5 Mg Tabs  (Warfarin Sodium) .... Use As Directed By Anticoagulation Clinic ([facility]) 5)  Allegra 180 Mg Tabs (Fexofenadine Hcl) .[name].. 1 By Mouth Once Daily As Needed 6)  Valtrex 1 Gm Tabs (Valacyclovir Hcl) .... As Directed 7)  Veramyst 27.5 Mcg/spray Susp (Fluticasone Furoate) .... 2 Sprays Each Nostril Once Daily 8)  Lunesta 3 Mg Tabs (Eszopiclone) .[name].. 1 By Mouth At Bedtime As Needed 9)  Retin-A 0.1 % Crea (Tretinoin) .... Apply At Bedtime 10)  Diflucan 150 Mg Tabs (Fluconazole) .[name].. 1 By Mouth Once Daily --May Repeat in 3 Days Prn 11)  Mirena 20 Mcg/24hr Iud (Levonorgestrel) 12)  Hydroquinone 4 % Crea (Hydroquinone) .... Apply Two Times A Day 13)  Xanax 0.25 Mg Tabs (Alprazolam) .[name].. 1 By Mouth Three Times A Day As Needed 14)  Vyvanse 70 Mg Caps (Lisdexamfetamine Dimesylate) .[name].. 1 By Mouth Once Daily 15)  Lovaza 1 Gm Caps (Omega-3-Acid Ethyl Esters) .... 2 By Mouth Two Times A Day  Current Medications (verified): 1)  Zegerid 40-1100 Mg Caps (Omeprazole-Sodium Bicarbonate) .... One Capsule Daily 2)  Urelle 81 Mg Tabs (Meth-Hyo-M Bl-Na Phos-Ph Sal) .... As Directed 3)  Coumadin 10 Mg Tabs (Warfarin Sodium) .... As Directed 4)  Allegra 180 Mg Tabs (Fexofenadine Hcl) .Marland KitchenMarland KitchenMarland Kitchen  1 By Mouth Once Daily As Needed 5)  Valtrex 1 Gm Tabs (Valacyclovir Hcl) .... As Directed 6)  Veramyst 27.5 Mcg/spray Susp (Fluticasone Furoate) .... 2 Sprays Each Nostril Once Daily 7)  Retin-A 0.1 % Crea (Tretinoin) .... Apply At Bedtime 8)  Diflucan 150 Mg Tabs (Fluconazole) .[name].. 1 By Mouth Once Daily --May Repeat in 3 Days Prn 9)  Mirena 20 Mcg/24hr Iud (Levonorgestrel) 10)  Hydroquinone 4 % Crea (Hydroquinone) .... Apply Two Times A Day 11)  Xanax 0.25 Mg Tabs (Alprazolam) .[name].. 1 By Mouth Three Times A Day As Needed 12)  Vyvanse 70 Mg Caps (Lisdexamfetamine Dimesylate) .[name].. 1 By Mouth Once Daily 13)  Lovaza 1 Gm Caps (Omega-3-Acid Ethyl Esters) .... 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 Mother side: heart trouble, htn Father's side: cancer, colon cancer and breast cancer Family History of Asthma Family History Hypertension Family History High cholesterol  Social History: Reviewed history from 05/17/2008 and no changes required. 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 Eyes:  Denies blurring, discharge, double vision, eye irritation, eye pain, halos, itching, light sensitivity, red eye, vision loss-1 eye, and vision loss-both eyes. ENT:  Denies decreased hearing, difficulty swallowing, ear discharge, earache, hoarseness, nasal congestion, nosebleeds, postnasal drainage, ringing in ears, sinus pressure, and sore throat. CV:  Denies bluish discoloration of lips or nails, chest pain or discomfort, difficulty breathing at night, difficulty breathing while lying down, fainting, fatigue, leg cramps with  exertion, lightheadness, near fainting, palpitations, shortness of breath with exertion, swelling of feet, swelling of hands, and weight gain. Resp:  Denies chest discomfort, chest pain with inspiration, cough, coughing up blood, excessive snoring, hypersomnolence, morning headaches, pleuritic, shortness of breath, sputum productive, and wheezing. GI:  Denies abdominal pain, bloody stools, change in bowel habits, constipation, dark tarry stools, diarrhea, excessive appetite, gas, hemorrhoids, indigestion, loss of appetite, nausea, vomiting, vomiting blood, and yellowish skin color. GU:  Denies abnormal vaginal bleeding, decreased libido, discharge, dysuria, genital sores, hematuria, incontinence, nocturia, urinary frequency, and urinary hesitancy. MS:  Denies joint pain, joint redness, joint swelling, loss of strength, low back pain, mid back pain, muscle aches, muscle , cramps, muscle weakness, stiffness, and thoracic pain. Derm:  Complains of flushing; denies changes in color of skin, changes in nail beds, dryness, excessive perspiration, hair loss, insect bite(s), itching, lesion(s), poor wound healing, and rash. Neuro:  Denies brief paralysis, difficulty with concentration, disturbances in coordination, falling down, headaches, inability to speak, memory loss, numbness, poor balance, seizures, sensation of room spinning, tingling, tremors, visual disturbances, and  weakness. Psych:  Denies alternate hallucination ( auditory/visual), anxiety, depression, easily angered, easily tearful, irritability, mental problems, panic attacks, sense of great danger, suicidal thoughts/plans, thoughts of violence, unusual visions or sounds, and thoughts /plans of harming others. Endo:  Denies cold intolerance, excessive hunger, excessive thirst, excessive urination, heat intolerance, polyuria, and weight change. Heme:  Denies abnormal bruising, bleeding, enlarge lymph nodes, fevers, pallor, and skin  discoloration. Allergy:  Denies hives or rash, itching eyes, persistent infections, seasonal allergies, and sneezing.  Physical Exam  General:  Well-developed,well-nourished,in no acute distress; alert,appropriate and cooperative throughout examination Head:  Normocephalic and atraumatic without obvious abnormalities. No apparent alopecia or balding. Eyes:  pupils equal, pupils round, pupils reactive to light, and no injection.   Ears:  External ear exam shows no significant lesions or deformities.  Otoscopic examination reveals clear canals, tympanic membranes are intact bilaterally without bulging, retraction, inflammation or discharge. Hearing is grossly normal bilaterally. Nose:  External nasal examination shows no deformity or inflammation. Nasal mucosa are pink and moist without lesions or exudates. Mouth:  Oral mucosa and oropharynx without lesions or exudates.  Teeth in good repair. Neck:  No deformities, masses, or tenderness noted. Breasts:  gyn Lungs:  Normal respiratory effort, chest expands symmetrically. Lungs are clear to auscultation, no crackles or wheezes. Heart:  normal rate and no murmur.   Abdomen:  Bowel sounds positive,abdomen soft and non-tender without masses, organomegaly or hernias noted. Genitalia:  gyn Msk:  normal ROM and no joint swelling.   Pulses:  R and L carotid,radial,femoral,dorsalis pedis and posterior tibial pulses are full and equal bilaterally Skin:  Intact without suspicious lesions or rashes Cervical Nodes:  No lymphadenopathy noted Axillary Nodes:  No palpable lymphadenopathy Psych:  Cognition and judgment appear intact. Alert and cooperative with normal attention span and concentration. 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 One capsule daily  Diagnostics Reviewed:  Discussed lifestyle modifications, diet, antacids/medications, and preventive measures. Handout provided.   Problem # 5:  ALLERGIC RHINITIS, SEASONAL (ICD-477.0) per allergists  Complete Medication List: 1)  Zegerid 40-1100 Mg Caps (Omeprazole-sodium bicarbonate) .... One capsule daily 2)  Urelle 81 Mg Tabs (Meth-hyo-m bl-na phos-ph sal) .... As directed 3)  Coumadin 10 Mg Tabs (Warfarin sodium) .... As directed 4)  Allegra 180 Mg Tabs (Fexofenadine hcl) .[name].. 1 by mouth once daily as needed 5)  Valtrex 1 Gm Tabs (Valacyclovir hcl) .... As directed 6)  Veramyst 27.5 Mcg/spray Susp (Fluticasone furoate) .... 2 sprays each nostril once daily 7)  Retin-a 0.1 % Crea (Tretinoin) .... Apply at bedtime 8)  Diflucan 150 Mg Tabs (Fluconazole) .[name].. 1 by mouth once daily --may repeat in 3 days prn 9)  Mirena 20 Mcg/24hr Iud (Levonorgestrel) 10)  Hydroquinone 4 % Crea (Hydroquinone) .... Apply two times a day 11)  Xanax 0.25 Mg Tabs (Alprazolam) .[name].. 1 by mouth three times a day as needed 12)  Vyvanse 70 Mg Caps (Lisdexamfetamine dimesylate) .Marland KitchenMarland KitchenMarland Kitchen  1 by mouth once daily 13)  Lovaza 1 Gm Caps (Omega-3-acid ethyl esters) .... 2 by mouth two times a day  Patient Instructions: 1)  Please schedule a follow-up appointment in 6 months .  Prescriptions: VYVANSE 70 MG CAPS (LISDEXAMFETAMINE DIMESYLATE) 1 by mouth once daily  #30 x 0   Entered and Authorized by:   Loreen Freud DO    Signed by:   Loreen Freud DO on 08/15/2010   Method used:   Print then Give to Patient   RxID:   [id number] VERAMYST 27.5 MCG/SPRAY SUSP (FLUTICASONE FUROATE) 2 sprays each nostril once daily  #1 x 11   Entered by:   Almeta Monas CMA (AAMA)   Authorized by:   Loreen Freud DO   Signed by:   Almeta Monas CMA (AAMA) on 08/15/2010   Method used:   Faxed to ...       [facility]* (retail)       [address]       [address]       [place], [place]  [address]       Ph: [phone]       Fax: [phone]   RxID:   [id number] VALTREX 1 GM TABS (VALACYCLOVIR HCL) as directed  #90 x 3   Entered by:   Almeta Monas CMA (AAMA)   Authorized by:   Loreen Freud DO   Signed by:   Almeta Monas CMA (AAMA) on 08/15/2010   Method used:   Faxed to ...       [facility]* (retail)       [address]       [address]       [place], [place]  [address]       Ph: [phone]       Fax: [phone]   RxID:   [id number]    Orders Added: 1)  Venipuncture [54270] 2)  TLB-Lipid Panel [80061-LIPID] 3)  TLB-BMP (Basic Metabolic Panel-BMET) [80048-METABOL] 4)  TLB-CBC Platelet - w/Differential [85025-CBCD] 5)  TLB-Hepatic/Liver Function Pnl [80076-HEPATIC] 6)  TLB-TSH (Thyroid Stimulating Hormone) [84443-TSH] 7)  Specimen Handling [99000] 8)  UA Dipstick W/ Micro (manual) [81000] 9)  Est. Patient 18-39 years [99395]    TD Result Date:  10/14/2007 TD Result:  given TD Next Due:  10 yr PAP Result Date:  06/20/2010 PAP Result:  normal PAP Next Due:  1 yr   Laboratory Results   Urine Tests   Date/Time Reported: August 15, 2010 1:04 PM   Routine Urinalysis   Color: yellow Appearance: Clear Glucose: negative   (Normal Range: Negative) Bilirubin: negative   (Normal Range: Negative) Ketone: negative   (Normal Range: Negative) Spec. Gravity: 1.020   (Normal Range: 1.003-1.035) Blood: negative   (Normal Range: Negative) pH: 5.0   (Normal  Range: 5.0-8.0) Protein: negative   (Normal Range: Negative) Urobilinogen: negative   (Normal Range: 0-1) Nitrite: negative   (Normal Range: Negative) Leukocyte Esterace: negative   (Normal Range: Negative)    Comments: Floydene Flock  August 15, 2010 1:04 PM

## 2010-09-01 ENCOUNTER — Encounter: Payer: Self-pay | Admitting: Family Medicine

## 2010-09-17 ENCOUNTER — Telehealth (INDEPENDENT_AMBULATORY_CARE_PROVIDER_SITE_OTHER): Payer: Self-pay | Admitting: *Deleted

## 2010-09-25 NOTE — Progress Notes (Signed)
Summary: Vyvanse refill  Phone Note Refill Request Message from:  Patient on September 17, 2010 3:03 PM  Refills Requested: Medication #1:  VYVANSE 70 MG CAPS 1 by mouth once daily    Prescriptions: VYVANSE 70 MG CAPS (LISDEXAMFETAMINE DIMESYLATE) 1 by mouth once daily  #30 x 0   Entered by:   Almeta Monas CMA (AAMA)   Authorized by:   Loreen Freud DO   Signed by:   Almeta Monas CMA (AAMA) on 09/17/2010   Method used:   Print then Give to Patient   RxID:   [id number]

## 2010-10-17 ENCOUNTER — Other Ambulatory Visit: Payer: Self-pay

## 2010-10-17 MED ORDER — LISDEXAMFETAMINE DIMESYLATE 70 MG PO CAPS: 70.0000 mg | ORAL_CAPSULE | ORAL | Status: DC

## 2010-10-23 ENCOUNTER — Encounter: Payer: Self-pay | Admitting: Cardiology

## 2010-10-23 LAB — PROTIME-INR

## 2010-10-26 ENCOUNTER — Ambulatory Visit (INDEPENDENT_AMBULATORY_CARE_PROVIDER_SITE_OTHER): Payer: Managed Care, Other (non HMO) | Admitting: *Deleted

## 2010-10-26 DIAGNOSIS — Z111 Encounter for screening for respiratory tuberculosis: Secondary | ICD-10-CM

## 2010-11-01 ENCOUNTER — Encounter: Payer: Self-pay | Admitting: Cardiology

## 2010-11-08 ENCOUNTER — Encounter: Payer: Self-pay | Admitting: Cardiology

## 2010-11-13 ENCOUNTER — Other Ambulatory Visit: Payer: Self-pay

## 2010-11-13 MED ORDER — LISDEXAMFETAMINE DIMESYLATE 70 MG PO CAPS: 70.0000 mg | ORAL_CAPSULE | ORAL | Status: DC

## 2010-11-13 NOTE — Telephone Encounter (Signed)
Pt in office to pick up RX----   KP

## 2010-11-22 LAB — POCT INR: INR: 1.9

## 2010-11-23 ENCOUNTER — Ambulatory Visit (INDEPENDENT_AMBULATORY_CARE_PROVIDER_SITE_OTHER): Payer: Managed Care, Other (non HMO) | Admitting: Cardiology

## 2010-11-23 DIAGNOSIS — D6859 Other primary thrombophilia: Secondary | ICD-10-CM | POA: Insufficient documentation

## 2010-11-23 DIAGNOSIS — I824Y9 Acute embolism and thrombosis of unspecified deep veins of unspecified proximal lower extremity: Secondary | ICD-10-CM

## 2010-11-23 NOTE — Op Note (Signed)
NAMEALEXZANDRA, BILTON                           ACCOUNT NO.:  [id number]   MEDICAL RECORD NO.:  [id number]                   PATIENT TYPE:  AMB   LOCATION:  ENDO                                 FACILITY:  [facility]   PHYSICIAN:  Anselmo Rod, M.D.               DATE OF BIRTH:  08/25/1975   DATE OF PROCEDURE:  04/04/2003  DATE OF DISCHARGE:                                 OPERATIVE REPORT   PROCEDURE PERFORMED:  Screening colonoscopy.   ENDOSCOPIST:  Charna Elizabeth, M.D.   INSTRUMENT USED:  Olympus adjustable pediatric video colonoscope changed to  adult scope.   INDICATIONS FOR PROCEDURE:  Rectal bleeding and family history of colon  cancer in a 35 year old white female with ongoing rectal bleeding in spite  of treatment of anal fissure.  Rule out colonic polyps, masses, hemorrhoids,  etc.   PREPROCEDURE PREPARATION:  Informed consent was procured from the patient.  The patient was fasted for eight hours prior to the procedure and prepped  with a bottle of magnesium citrate and a gallon of GoLYTELY the night prior  to the procedure.   PREPROCEDURE PHYSICAL:  The patient had stable vital signs.  Neck supple.  Chest clear to auscultation.  S1 and S2 regular.  Abdomen soft with normal  bowel sounds.   DESCRIPTION OF PROCEDURE:  The patient was placed in left lateral decubitus  position and sedated with 60 mg of Demerol and 6 mg of Versed in slow  incremental doses.  Once the patient was adequately sedated and maintained  on low flow oxygen and continuous cardiac monitoring, the Olympus video  pediatric colonoscope was advanced from the rectum to the hepatic flexure  with extreme difficulty.  There was constant looping of the scope.  The  pediatric adjustable colonoscope was withdrawn.  After attempting to reach  the cecum by applying abdominal pressure gently and changing the patient's  position from the left lateral to the supine and the right lateral  positions, the adult  colonoscope was used instead and the procedure was  complete up to the cecum and terminal ileum. The appendicular orifice and  ileocecal valve were clearly visualized and photographed.  No masses,  polyps, erosions, ulcerations or diverticula were seen.  Small internal  hemorrhoids were appreciated on retroflexion in the rectum.  The patient  tolerated the procedure well without complications.  The terminal ileum also  appeared healthy without lesions.   IMPRESSION:  1. Normal colonoscopy up to the terminal ileum except for small internal     hemorrhoids.  2. Repeat colorectal cancer screening is recommended in the next 10 years     unless the patient develops any abnormal symptoms in the interim.  3. Outpatient follow-up in the next two weeks or earlier if need be.  Repeat     anoscopy will be done at that time.  Anselmo Rod, M.D.    JNM/MEDQ  D:  04/04/2003  T:  04/04/2003  Job:  [id number]   cc:   Lucky Cowboy, M.D.  [address], [address]  [place], [place] [address]  Fax: [phone]

## 2010-11-28 ENCOUNTER — Encounter: Payer: Self-pay | Admitting: Cardiology

## 2010-12-07 ENCOUNTER — Ambulatory Visit: Payer: Self-pay | Admitting: *Deleted

## 2010-12-07 ENCOUNTER — Telehealth: Payer: Self-pay | Admitting: *Deleted

## 2010-12-07 ENCOUNTER — Encounter: Payer: Self-pay | Admitting: Cardiology

## 2010-12-07 NOTE — Telephone Encounter (Signed)
Opened in error

## 2010-12-18 ENCOUNTER — Other Ambulatory Visit: Payer: Self-pay

## 2010-12-18 MED ORDER — LISDEXAMFETAMINE DIMESYLATE 70 MG PO CAPS: 70.0000 mg | ORAL_CAPSULE | ORAL | Status: DC

## 2010-12-20 ENCOUNTER — Encounter: Payer: Self-pay | Admitting: Cardiology

## 2011-01-01 ENCOUNTER — Telehealth: Payer: Self-pay | Admitting: *Deleted

## 2011-01-01 NOTE — Telephone Encounter (Signed)
LMOM for pt to call back with her INR report; she performs this at home.

## 2011-01-02 ENCOUNTER — Ambulatory Visit: Payer: Self-pay | Admitting: *Deleted

## 2011-01-10 ENCOUNTER — Ambulatory Visit (INDEPENDENT_AMBULATORY_CARE_PROVIDER_SITE_OTHER): Payer: Self-pay | Admitting: *Deleted

## 2011-01-10 DIAGNOSIS — R0989 Other specified symptoms and signs involving the circulatory and respiratory systems: Secondary | ICD-10-CM

## 2011-01-11 ENCOUNTER — Encounter: Payer: Self-pay | Admitting: Cardiology

## 2011-01-17 ENCOUNTER — Other Ambulatory Visit: Payer: Self-pay | Admitting: Family Medicine

## 2011-01-17 DIAGNOSIS — F988 Other specified behavioral and emotional disorders with onset usually occurring in childhood and adolescence: Secondary | ICD-10-CM

## 2011-01-17 MED ORDER — LISDEXAMFETAMINE DIMESYLATE 70 MG PO CAPS: 70.0000 mg | ORAL_CAPSULE | ORAL | Status: DC

## 2011-01-22 ENCOUNTER — Ambulatory Visit: Payer: Self-pay | Admitting: *Deleted

## 2011-01-23 ENCOUNTER — Encounter: Payer: Self-pay | Admitting: Cardiology

## 2011-02-07 ENCOUNTER — Encounter: Payer: Self-pay | Admitting: Cardiology

## 2011-02-20 ENCOUNTER — Telehealth: Payer: Self-pay | Admitting: Cardiology

## 2011-02-20 NOTE — Telephone Encounter (Signed)
Please call [facility] Monitoring [phone] Option 2, regarding non compliance with reporting results, chart in box

## 2011-02-22 ENCOUNTER — Other Ambulatory Visit: Payer: Self-pay | Admitting: Family Medicine

## 2011-02-22 DIAGNOSIS — F988 Other specified behavioral and emotional disorders with onset usually occurring in childhood and adolescence: Secondary | ICD-10-CM

## 2011-02-22 MED ORDER — LISDEXAMFETAMINE DIMESYLATE 70 MG PO CAPS: 70.0000 mg | ORAL_CAPSULE | ORAL | Status: DC

## 2011-02-25 NOTE — Telephone Encounter (Signed)
Called and spoke with [facility]

## 2011-02-27 ENCOUNTER — Encounter: Payer: Self-pay | Admitting: Cardiology

## 2011-03-01 ENCOUNTER — Other Ambulatory Visit: Payer: Self-pay | Admitting: Family Medicine

## 2011-03-07 ENCOUNTER — Encounter: Payer: Self-pay | Admitting: Cardiology

## 2011-03-15 ENCOUNTER — Other Ambulatory Visit: Payer: Self-pay | Admitting: Family Medicine

## 2011-03-15 MED ORDER — URELLE 81 MG PO TABS: 1.0000 | ORAL_TABLET | ORAL | Status: DC

## 2011-03-15 NOTE — Telephone Encounter (Signed)
OK per Dr.Lowne     KP

## 2011-03-26 ENCOUNTER — Encounter: Payer: Self-pay | Admitting: Cardiology

## 2011-03-29 ENCOUNTER — Encounter: Payer: Self-pay | Admitting: Family Medicine

## 2011-03-29 ENCOUNTER — Ambulatory Visit (INDEPENDENT_AMBULATORY_CARE_PROVIDER_SITE_OTHER): Payer: Managed Care, Other (non HMO) | Admitting: Family Medicine

## 2011-03-29 VITALS — BP 114/72 | HR 79 | Temp 99.2°F | Wt 119.8 lb

## 2011-03-29 DIAGNOSIS — J029 Acute pharyngitis, unspecified: Secondary | ICD-10-CM

## 2011-03-29 DIAGNOSIS — J069 Acute upper respiratory infection, unspecified: Secondary | ICD-10-CM

## 2011-03-29 LAB — POCT RAPID STREP A (OFFICE): Rapid Strep A Screen: NEGATIVE

## 2011-03-29 MED ORDER — HYDROCOD POLST-CHLORPHEN POLST 10-8 MG/5ML PO LQCR: 5.0000 mL | Freq: Two times a day (BID) | ORAL | Status: DC | PRN

## 2011-03-29 NOTE — Progress Notes (Signed)
  Subjective:     Michele Turner is a 35 y.o. female who presents for evaluation of symptoms of a URI. Symptoms include bilateral ear pressure/pain, low grade fever, non productive cough and sore throat. Onset of symptoms was 6 days ago, and has been gradually worsening since that time. Treatment to date: none.  The following portions of the patient's history were reviewed and updated as appropriate: allergies, current medications, past family history, past medical history, past social history, past surgical history and problem list.  Review of Systems Pertinent items are noted in HPI.   Objective:    BP 114/72  Pulse 79  Temp(Src) 99.2 F (37.3 C) (Oral)  Wt 119 lb 12.8 oz (54.341 kg)  SpO2 99% General appearance: alert, cooperative, appears stated age and no distress Eyes: conjunctivae/corneas clear. PERRL, EOM's intact. Fundi benign. Ears: normal TM's and external ear canals both ears Nose: Nares normal. Septum midline. Mucosa normal. No drainage or sinus tenderness. Throat: abnormal findings: mild oropharyngeal erythema Neck: no adenopathy, supple, symmetrical, trachea midline and thyroid not enlarged, symmetric, no tenderness/mass/nodules Lungs: clear to auscultation bilaterally Heart: S1, S2 normal Extremities: extremities normal, atraumatic, no cyanosis or edema   Assessment:    viral upper respiratory illness   Plan:    Discussed diagnosis and treatment of URI. Suggested symptomatic OTC remedies. Nasal saline spray for congestion. Follow up as needed.

## 2011-03-29 NOTE — Patient Instructions (Signed)
Common Cold, Adult An upper respiratory tract infection, or cold, is a viral infection of the air passages to the lung. Colds are contagious, especially during the first 3 or 4 days. Antibiotics cannot cure a cold. Cold germs are spread by coughs, sneezes, and hand to hand contact. A respiratory tract infection usually clears up in a few days, but some people may be sick for a week or two. HOME CARE INSTRUCTIONS  Only take over-the-counter or prescription medicines for pain, discomfort, or fever as directed by your caregiver.   Be careful not to blow your nose too hard. This may cause a nosebleed.   Use a cool-mist humidifier (vaporizer) to increase air moisture. This will make it easier for you to breath. Do not use hot steam.   Rest as much as possible and get plenty of sleep.   Wash your hands often, especially after you blow your nose. Cover your mouth and nose with a tissue when you sneeze or cough.   Drink at least 8 glasses of clear liquids every day, such as water, fruit juices, tea, clear soups, and carbonated beverages.  SEEK MEDICAL CARE IF:  An oral temperature above 100.4 lasts 4 days or more, and is not controlled by medication.   You have a sore throat that gets worse or you see white or yellow spots in your throat.   Your cough gets worse or lasts more than 10 days.   You have a rash somewhere on your skin. You have large and tender lumps in your neck.   You have an earache or a headache.   You have thick, greenish or yellowish discharge from your nose.   You cough-up thick yellow, green, gray or bloody mucus (secretions).  SEEK IMMEDIATE MEDICAL CARE IF: You have trouble breathing, chest pain, or your skin or nails look gray or blue. MAKE SURE YOU:   Understand these instructions.   Will watch your condition.   Will get help right away if you are not doing well or get worse.  Document Released: 06/21/2000 Document Re-Released: 06/06/2008 [employer] Patient  Information 2011 [employer]. 

## 2011-04-01 ENCOUNTER — Telehealth: Payer: Self-pay | Admitting: Family Medicine

## 2011-04-01 ENCOUNTER — Other Ambulatory Visit: Payer: Self-pay | Admitting: Family Medicine

## 2011-04-01 DIAGNOSIS — N390 Urinary tract infection, site not specified: Secondary | ICD-10-CM

## 2011-04-01 MED ORDER — CIPROFLOXACIN HCL 500 MG PO TB24: 500.0000 mg | ORAL_TABLET | Freq: Every day | ORAL | Status: AC

## 2011-04-02 NOTE — Telephone Encounter (Signed)
Refill request on Cipro from the pharmacy. Please advise    KP

## 2011-04-02 NOTE — Telephone Encounter (Signed)
I took care of that yesterday

## 2011-04-28 LAB — POCT INR
INR: 2.2
INR: 2.8

## 2011-05-01 ENCOUNTER — Ambulatory Visit (INDEPENDENT_AMBULATORY_CARE_PROVIDER_SITE_OTHER): Payer: Self-pay | Admitting: Internal Medicine

## 2011-05-01 DIAGNOSIS — R0989 Other specified symptoms and signs involving the circulatory and respiratory systems: Secondary | ICD-10-CM

## 2011-05-01 DIAGNOSIS — I82409 Acute embolism and thrombosis of unspecified deep veins of unspecified lower extremity: Secondary | ICD-10-CM

## 2011-05-01 DIAGNOSIS — D6859 Other primary thrombophilia: Secondary | ICD-10-CM

## 2011-05-01 MED ORDER — WARFARIN SODIUM 5 MG PO TABS: ORAL_TABLET | ORAL | Status: DC

## 2011-05-07 ENCOUNTER — Encounter: Payer: Self-pay | Admitting: Cardiology

## 2011-05-07 LAB — PROTIME-INR

## 2011-05-08 ENCOUNTER — Ambulatory Visit (INDEPENDENT_AMBULATORY_CARE_PROVIDER_SITE_OTHER): Payer: Self-pay | Admitting: Cardiology

## 2011-05-08 DIAGNOSIS — D6859 Other primary thrombophilia: Secondary | ICD-10-CM

## 2011-05-08 DIAGNOSIS — I82409 Acute embolism and thrombosis of unspecified deep veins of unspecified lower extremity: Secondary | ICD-10-CM

## 2011-05-08 DIAGNOSIS — R0989 Other specified symptoms and signs involving the circulatory and respiratory systems: Secondary | ICD-10-CM

## 2011-05-14 LAB — POCT INR: INR: 1.7

## 2011-05-15 ENCOUNTER — Telehealth: Payer: Self-pay | Admitting: Cardiology

## 2011-05-15 ENCOUNTER — Ambulatory Visit (INDEPENDENT_AMBULATORY_CARE_PROVIDER_SITE_OTHER): Payer: Self-pay | Admitting: Cardiology

## 2011-05-15 DIAGNOSIS — I82409 Acute embolism and thrombosis of unspecified deep veins of unspecified lower extremity: Secondary | ICD-10-CM

## 2011-05-15 DIAGNOSIS — D6859 Other primary thrombophilia: Secondary | ICD-10-CM

## 2011-05-15 DIAGNOSIS — R0989 Other specified symptoms and signs involving the circulatory and respiratory systems: Secondary | ICD-10-CM

## 2011-05-15 NOTE — Telephone Encounter (Signed)
No answer

## 2011-05-15 NOTE — Telephone Encounter (Signed)
[facility] with [facility]

## 2011-05-15 NOTE — Telephone Encounter (Signed)
Please call about home monitoring results

## 2011-05-22 LAB — PROTIME-INR

## 2011-05-22 LAB — POCT INR: INR: 2.5

## 2011-05-23 ENCOUNTER — Ambulatory Visit (INDEPENDENT_AMBULATORY_CARE_PROVIDER_SITE_OTHER): Payer: Self-pay | Admitting: Cardiology

## 2011-05-23 DIAGNOSIS — D6859 Other primary thrombophilia: Secondary | ICD-10-CM

## 2011-05-23 DIAGNOSIS — I82409 Acute embolism and thrombosis of unspecified deep veins of unspecified lower extremity: Secondary | ICD-10-CM

## 2011-05-23 DIAGNOSIS — R0989 Other specified symptoms and signs involving the circulatory and respiratory systems: Secondary | ICD-10-CM

## 2011-05-24 ENCOUNTER — Other Ambulatory Visit: Payer: Self-pay | Admitting: Family Medicine

## 2011-05-28 ENCOUNTER — Other Ambulatory Visit: Payer: Self-pay

## 2011-05-28 DIAGNOSIS — F988 Other specified behavioral and emotional disorders with onset usually occurring in childhood and adolescence: Secondary | ICD-10-CM

## 2011-05-28 MED ORDER — LISDEXAMFETAMINE DIMESYLATE 70 MG PO CAPS: 70.0000 mg | ORAL_CAPSULE | ORAL | Status: DC

## 2011-05-28 NOTE — Telephone Encounter (Signed)
Rx given to patient     KP

## 2011-05-29 ENCOUNTER — Ambulatory Visit (INDEPENDENT_AMBULATORY_CARE_PROVIDER_SITE_OTHER): Payer: Self-pay | Admitting: Cardiovascular Disease

## 2011-05-29 DIAGNOSIS — I82409 Acute embolism and thrombosis of unspecified deep veins of unspecified lower extremity: Secondary | ICD-10-CM

## 2011-05-29 DIAGNOSIS — R0989 Other specified symptoms and signs involving the circulatory and respiratory systems: Secondary | ICD-10-CM

## 2011-05-29 DIAGNOSIS — D6859 Other primary thrombophilia: Secondary | ICD-10-CM

## 2011-05-29 LAB — POCT INR: INR: 2.4

## 2011-06-03 ENCOUNTER — Encounter: Payer: Self-pay | Admitting: Cardiology

## 2011-06-05 ENCOUNTER — Encounter: Payer: Self-pay | Admitting: Cardiology

## 2011-06-11 ENCOUNTER — Ambulatory Visit (INDEPENDENT_AMBULATORY_CARE_PROVIDER_SITE_OTHER): Payer: Self-pay | Admitting: Cardiology

## 2011-06-11 DIAGNOSIS — R0989 Other specified symptoms and signs involving the circulatory and respiratory systems: Secondary | ICD-10-CM

## 2011-06-11 DIAGNOSIS — D6859 Other primary thrombophilia: Secondary | ICD-10-CM

## 2011-06-11 DIAGNOSIS — I82409 Acute embolism and thrombosis of unspecified deep veins of unspecified lower extremity: Secondary | ICD-10-CM

## 2011-06-20 ENCOUNTER — Ambulatory Visit (INDEPENDENT_AMBULATORY_CARE_PROVIDER_SITE_OTHER): Payer: Self-pay | Admitting: Cardiology

## 2011-06-20 DIAGNOSIS — R0989 Other specified symptoms and signs involving the circulatory and respiratory systems: Secondary | ICD-10-CM

## 2011-06-20 DIAGNOSIS — I82409 Acute embolism and thrombosis of unspecified deep veins of unspecified lower extremity: Secondary | ICD-10-CM

## 2011-06-20 DIAGNOSIS — D6859 Other primary thrombophilia: Secondary | ICD-10-CM

## 2011-06-26 ENCOUNTER — Ambulatory Visit (INDEPENDENT_AMBULATORY_CARE_PROVIDER_SITE_OTHER): Payer: Self-pay | Admitting: Cardiology

## 2011-06-26 DIAGNOSIS — D6859 Other primary thrombophilia: Secondary | ICD-10-CM

## 2011-06-26 DIAGNOSIS — I82409 Acute embolism and thrombosis of unspecified deep veins of unspecified lower extremity: Secondary | ICD-10-CM

## 2011-06-26 DIAGNOSIS — R0989 Other specified symptoms and signs involving the circulatory and respiratory systems: Secondary | ICD-10-CM

## 2011-06-26 LAB — POCT INR: INR: 2.9

## 2011-06-28 ENCOUNTER — Encounter: Payer: Self-pay | Admitting: Cardiology

## 2011-07-04 ENCOUNTER — Ambulatory Visit (INDEPENDENT_AMBULATORY_CARE_PROVIDER_SITE_OTHER): Payer: Self-pay | Admitting: Cardiovascular Disease

## 2011-07-04 DIAGNOSIS — I82409 Acute embolism and thrombosis of unspecified deep veins of unspecified lower extremity: Secondary | ICD-10-CM

## 2011-07-04 DIAGNOSIS — R0989 Other specified symptoms and signs involving the circulatory and respiratory systems: Secondary | ICD-10-CM

## 2011-07-04 DIAGNOSIS — D6859 Other primary thrombophilia: Secondary | ICD-10-CM

## 2011-07-11 LAB — POCT INR: INR: 2.2

## 2011-07-12 ENCOUNTER — Ambulatory Visit (INDEPENDENT_AMBULATORY_CARE_PROVIDER_SITE_OTHER): Payer: Self-pay | Admitting: Cardiology

## 2011-07-12 DIAGNOSIS — R0989 Other specified symptoms and signs involving the circulatory and respiratory systems: Secondary | ICD-10-CM

## 2011-07-12 DIAGNOSIS — I82409 Acute embolism and thrombosis of unspecified deep veins of unspecified lower extremity: Secondary | ICD-10-CM

## 2011-07-12 DIAGNOSIS — D6859 Other primary thrombophilia: Secondary | ICD-10-CM

## 2011-07-21 LAB — POCT INR: INR: 2.2

## 2011-07-22 ENCOUNTER — Ambulatory Visit (INDEPENDENT_AMBULATORY_CARE_PROVIDER_SITE_OTHER): Payer: Self-pay | Admitting: Cardiology

## 2011-07-22 DIAGNOSIS — D6859 Other primary thrombophilia: Secondary | ICD-10-CM

## 2011-07-22 DIAGNOSIS — I82409 Acute embolism and thrombosis of unspecified deep veins of unspecified lower extremity: Secondary | ICD-10-CM

## 2011-07-22 DIAGNOSIS — R0989 Other specified symptoms and signs involving the circulatory and respiratory systems: Secondary | ICD-10-CM

## 2011-07-26 ENCOUNTER — Ambulatory Visit (INDEPENDENT_AMBULATORY_CARE_PROVIDER_SITE_OTHER): Payer: Managed Care, Other (non HMO) | Admitting: Cardiology

## 2011-07-26 ENCOUNTER — Other Ambulatory Visit: Payer: Self-pay | Admitting: Family Medicine

## 2011-07-26 DIAGNOSIS — D6859 Other primary thrombophilia: Secondary | ICD-10-CM

## 2011-07-26 DIAGNOSIS — I82409 Acute embolism and thrombosis of unspecified deep veins of unspecified lower extremity: Secondary | ICD-10-CM

## 2011-07-26 NOTE — Telephone Encounter (Signed)
Last OV 08-15-10 , last filled 10-19-09 #[id number]

## 2011-07-29 MED ORDER — ALPRAZOLAM 0.25 MG PO TABS: 0.2500 mg | ORAL_TABLET | Freq: Three times a day (TID) | ORAL | Status: DC | PRN

## 2011-07-29 NOTE — Telephone Encounter (Signed)
Ok to refill x1---pt due for ov

## 2011-07-29 NOTE — Telephone Encounter (Signed)
Letter mailed to schedule an OV.     KP 

## 2011-08-02 LAB — POCT INR: INR: 3.2

## 2011-08-05 ENCOUNTER — Ambulatory Visit: Payer: Self-pay | Admitting: Cardiology

## 2011-08-05 DIAGNOSIS — I82409 Acute embolism and thrombosis of unspecified deep veins of unspecified lower extremity: Secondary | ICD-10-CM

## 2011-08-05 DIAGNOSIS — D6859 Other primary thrombophilia: Secondary | ICD-10-CM

## 2011-08-07 ENCOUNTER — Ambulatory Visit: Payer: Self-pay | Admitting: Cardiovascular Disease

## 2011-08-07 DIAGNOSIS — I82409 Acute embolism and thrombosis of unspecified deep veins of unspecified lower extremity: Secondary | ICD-10-CM

## 2011-08-07 DIAGNOSIS — D6859 Other primary thrombophilia: Secondary | ICD-10-CM

## 2011-08-14 ENCOUNTER — Ambulatory Visit (INDEPENDENT_AMBULATORY_CARE_PROVIDER_SITE_OTHER): Payer: Managed Care, Other (non HMO) | Admitting: Family Medicine

## 2011-08-14 ENCOUNTER — Encounter: Payer: Self-pay | Admitting: Family Medicine

## 2011-08-14 VITALS — BP 112/74 | HR 80 | Temp 98.8°F | Wt 124.8 lb

## 2011-08-14 DIAGNOSIS — G4700 Insomnia, unspecified: Secondary | ICD-10-CM

## 2011-08-14 DIAGNOSIS — F988 Other specified behavioral and emotional disorders with onset usually occurring in childhood and adolescence: Secondary | ICD-10-CM

## 2011-08-14 LAB — POCT INR: INR: 3.5

## 2011-08-14 MED ORDER — LISDEXAMFETAMINE DIMESYLATE 70 MG PO CAPS: 70.0000 mg | ORAL_CAPSULE | ORAL | Status: DC

## 2011-08-14 MED ORDER — ZOLPIDEM TARTRATE 5 MG PO TABS: 5.0000 mg | ORAL_TABLET | Freq: Every evening | ORAL | Status: DC | PRN

## 2011-08-14 NOTE — Progress Notes (Signed)
  Subjective:    Patient ID: Michele Turner, female    DOB: 05/07/1976, 36 y.o.   MRN: [id number]  HPI  Pt is here to discuss increased anxiety and stress secondary to situation with parents divorce.  She was told the children would all be called into court and she has been a wreck since.  Also a recent death of a close family friend did not help the situation.   Review of Systems    as above Objective:   Physical Exam  Constitutional: She is oriented to person, place, and time. She appears well-developed and well-nourished.  Cardiovascular: Regular rhythm.   Pulmonary/Chest: Effort normal and breath sounds normal.  Neurological: She is alert and oriented to person, place, and time.  Psychiatric: Her speech is normal and behavior is normal. Judgment and thought content normal. Her mood appears anxious. Her affect is angry. Her affect is not blunt, not labile and not inappropriate. Cognition and memory are normal.          Assessment & Plan:  Stress reaction--anxiety--- pt is reluctant to go on any daily meds.   She feels the situation will only last until the end of month when court is over                                           Increase xanax to 0.5  Tid prn                                        rto prn

## 2011-08-14 NOTE — Patient Instructions (Signed)

## 2011-08-15 ENCOUNTER — Ambulatory Visit: Payer: Managed Care, Other (non HMO) | Admitting: Family Medicine

## 2011-08-15 ENCOUNTER — Ambulatory Visit: Payer: Self-pay | Admitting: Cardiovascular Disease

## 2011-08-15 DIAGNOSIS — D6859 Other primary thrombophilia: Secondary | ICD-10-CM

## 2011-08-15 DIAGNOSIS — I82409 Acute embolism and thrombosis of unspecified deep veins of unspecified lower extremity: Secondary | ICD-10-CM

## 2011-08-21 ENCOUNTER — Other Ambulatory Visit: Payer: Self-pay | Admitting: *Deleted

## 2011-08-21 ENCOUNTER — Ambulatory Visit (INDEPENDENT_AMBULATORY_CARE_PROVIDER_SITE_OTHER): Payer: Self-pay | Admitting: Cardiovascular Disease

## 2011-08-21 DIAGNOSIS — D6859 Other primary thrombophilia: Secondary | ICD-10-CM

## 2011-08-21 DIAGNOSIS — I82409 Acute embolism and thrombosis of unspecified deep veins of unspecified lower extremity: Secondary | ICD-10-CM

## 2011-08-21 DIAGNOSIS — R0989 Other specified symptoms and signs involving the circulatory and respiratory systems: Secondary | ICD-10-CM

## 2011-08-21 MED ORDER — WARFARIN SODIUM 5 MG PO TABS: ORAL_TABLET | ORAL | Status: DC

## 2011-08-27 ENCOUNTER — Encounter: Payer: Self-pay | Admitting: Family Medicine

## 2011-08-27 ENCOUNTER — Ambulatory Visit (INDEPENDENT_AMBULATORY_CARE_PROVIDER_SITE_OTHER): Payer: Managed Care, Other (non HMO) | Admitting: Family Medicine

## 2011-08-27 VITALS — BP 112/72 | HR 81 | Temp 98.3°F | Wt 126.0 lb

## 2011-08-27 DIAGNOSIS — J309 Allergic rhinitis, unspecified: Secondary | ICD-10-CM

## 2011-08-27 DIAGNOSIS — Z9109 Other allergy status, other than to drugs and biological substances: Secondary | ICD-10-CM

## 2011-08-27 DIAGNOSIS — Z0000 Encounter for general adult medical examination without abnormal findings: Secondary | ICD-10-CM

## 2011-08-27 MED ORDER — FLUTICASONE-SALMETEROL 100-50 MCG/DOSE IN AEPB: INHALATION_SPRAY | RESPIRATORY_TRACT | Status: DC

## 2011-08-27 MED ORDER — LEVOCETIRIZINE DIHYDROCHLORIDE 5 MG PO TABS: 5.0000 mg | ORAL_TABLET | Freq: Every evening | ORAL | Status: DC

## 2011-08-27 NOTE — Patient Instructions (Signed)

## 2011-08-27 NOTE — Progress Notes (Signed)
  Subjective:     Michele Turner is a 36 y.o. female and is here for a comprehensive physical exam. The patient reports no problems.  History   Social History  . Marital Status: Married    Spouse Name: N/A    Number of Children: N/A  . Years of Education: N/A   Occupational History  . 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 Smoking status: Never Smoker   . Smokeless tobacco: Not on file  . Alcohol Use: Yes  . Drug Use: No  . Sexually Active: Not on file   Other Topics Concern  . Not on file   Social History Narrative  . No narrative on file   Health Maintenance  Topic Date Due  . Pap Smear  01/16/1996  . Influenza Vaccine  04/08/2011  . Tetanus/tdap  10/13/2017    The following portions of the patient's history were reviewed and updated as appropriate: allergies, current medications, past family history, past medical history, past social history, past surgical history and problem list.  Review of Systems Review of Systems  Constitutional: Negative for activity change, appetite change and fatigue.  HENT: Negative for hearing loss, congestion, tinnitus and ear discharge.  dentist q62m Eyes: Negative for visual disturbance (see optho q1y -- vision corrected to 20/20 with glasses).  Respiratory: Negative for cough, chest tightness and shortness of breath.   Cardiovascular: Negative for chest pain, palpitations and leg swelling.  Gastrointestinal: Negative for abdominal pain, diarrhea, constipation and abdominal distention.  Genitourinary: Negative for urgency, frequency, decreased urine volume and difficulty urinating.  Musculoskeletal: Negative for back pain, arthralgias and gait problem.  Skin: Negative for color change, pallor and rash.  Neurological: Negative for dizziness, light-headedness, numbness and headaches.  Hematological: Negative for adenopathy. Does not bruise/bleed easily.  Psychiatric/Behavioral: Negative for suicidal ideas, confusion, sleep  disturbance, self-injury, dysphoric mood, decreased concentration and agitation.      Objective:    BP 112/72  Pulse 81  Temp(Src) 98.3 F (36.8 C) (Oral)  Wt 126 lb (57.153 kg)  SpO2 98% General appearance: alert, cooperative, appears stated age and no distress Head: Normocephalic, without obvious abnormality, atraumatic Eyes: conjunctivae/corneas clear. PERRL, EOM's intact. Fundi benign. Ears: normal TM's and external ear canals both ears Nose: Nares normal. Septum midline. Mucosa normal. No drainage or sinus tenderness. Throat: lips, mucosa, and tongue normal; teeth and gums normal Neck: no adenopathy and thyroid not enlarged, symmetric, no tenderness/mass/nodules Back: symmetric, no curvature. ROM normal. No CVA tenderness. Lungs: clear to auscultation bilaterally Breasts: gyn Heart: regular rate and rhythm, S1, S2 normal, no murmur, click, rub or gallop Abdomen: soft, non-tender; bowel sounds normal; no masses,  no organomegaly Pelvic: gyn Extremities: extremities normal, atraumatic, no cyanosis or edema Pulses: 2+ and symmetric Skin: Skin color, texture, turgor normal. No rashes or lesions Lymph nodes: Cervical, supraclavicular, and axillary nodes normal. Neurologic: Alert and oriented X 3, normal strength and tone. Normal symmetric reflexes. Normal coordination and gait psych-- no depression or anxiety    Assessment:    Healthy female exam.  ADD  -- con't meds   Plan:    ghm utd See After Visit Summary for Counseling Recommendations

## 2011-08-29 ENCOUNTER — Other Ambulatory Visit: Payer: Managed Care, Other (non HMO)

## 2011-08-30 ENCOUNTER — Telehealth: Payer: Self-pay

## 2011-08-30 MED ORDER — MOXIFLOXACIN HCL 0.5 % OP SOLN: 1.0000 [drp] | Freq: Three times a day (TID) | OPHTHALMIC | Status: AC

## 2011-08-30 NOTE — Telephone Encounter (Signed)
Call from patient and she stated she was in a pediatric Office the other day and they reported seeing a lot of children with pink eye and she thinks she has it, she said both eyes are pink and she had dry crust on the eye lids in the morning. She had tried Pataday, Astronomer and Claritin for relief and has had none. Would like and Rx called to [facility] [address]. Please advise   Kp

## 2011-08-30 NOTE — Telephone Encounter (Signed)
vigamox 1 gtt tid for 7 days  

## 2011-08-30 NOTE — Telephone Encounter (Signed)
Rx faxed and VM left making patient aware      KP

## 2011-08-31 LAB — POCT INR: INR: 3.5

## 2011-09-02 ENCOUNTER — Ambulatory Visit: Payer: Self-pay | Admitting: Cardiology

## 2011-09-02 DIAGNOSIS — I82409 Acute embolism and thrombosis of unspecified deep veins of unspecified lower extremity: Secondary | ICD-10-CM

## 2011-09-02 DIAGNOSIS — D6859 Other primary thrombophilia: Secondary | ICD-10-CM

## 2011-09-03 ENCOUNTER — Other Ambulatory Visit (INDEPENDENT_AMBULATORY_CARE_PROVIDER_SITE_OTHER): Payer: Managed Care, Other (non HMO)

## 2011-09-03 DIAGNOSIS — Z0000 Encounter for general adult medical examination without abnormal findings: Secondary | ICD-10-CM

## 2011-09-03 LAB — CBC WITH DIFFERENTIAL/PLATELET
Basophils Absolute: 0 10*3/uL (ref 0.0–0.1)
Basophils Relative: 0.5 % (ref 0.0–3.0)
Eosinophils Absolute: 0.1 10*3/uL (ref 0.0–0.7)
Eosinophils Relative: 0.9 % (ref 0.0–5.0)
HCT: 39.7 % (ref 36.0–46.0)
Hemoglobin: 13.3 g/dL (ref 12.0–15.0)
Lymphocytes Relative: 18.8 % (ref 12.0–46.0)
Lymphs Abs: 1.1 10*3/uL (ref 0.7–4.0)
MCHC: 33.4 g/dL (ref 30.0–36.0)
MCV: 91.4 fl (ref 78.0–100.0)
Monocytes Absolute: 0.7 10*3/uL (ref 0.1–1.0)
Monocytes Relative: 12.1 % — ABNORMAL HIGH (ref 3.0–12.0)
Neutro Abs: 4 10*3/uL (ref 1.4–7.7)
Neutrophils Relative %: 67.7 % (ref 43.0–77.0)
Platelets: 223 10*3/uL (ref 150.0–400.0)
RBC: 4.34 Mil/uL (ref 3.87–5.11)
RDW: 13 % (ref 11.5–14.6)
WBC: 5.9 10*3/uL (ref 4.5–10.5)

## 2011-09-03 LAB — HEPATIC FUNCTION PANEL
AST: 23 U/L (ref 0–37)
Alkaline Phosphatase: 39 U/L (ref 39–117)
Bilirubin, Direct: 0 mg/dL (ref 0.0–0.3)
Total Bilirubin: 0.5 mg/dL (ref 0.3–1.2)

## 2011-09-03 LAB — TSH: TSH: 0.8 u[IU]/mL (ref 0.35–5.50)

## 2011-09-03 LAB — LDL CHOLESTEROL, DIRECT: Direct LDL: 89.4 mg/dL

## 2011-09-03 LAB — LIPID PANEL
Cholesterol: 224 mg/dL — ABNORMAL HIGH (ref 0–200)
Total CHOL/HDL Ratio: 2

## 2011-09-03 LAB — BASIC METABOLIC PANEL
BUN: 11 mg/dL (ref 6–23)
CO2: 25 mEq/L (ref 19–32)
Calcium: 8.8 mg/dL (ref 8.4–10.5)
Creatinine, Ser: 0.7 mg/dL (ref 0.4–1.2)
Glucose, Bld: 97 mg/dL (ref 70–99)

## 2011-09-17 ENCOUNTER — Ambulatory Visit: Payer: Self-pay | Admitting: Cardiology

## 2011-09-17 DIAGNOSIS — I82409 Acute embolism and thrombosis of unspecified deep veins of unspecified lower extremity: Secondary | ICD-10-CM

## 2011-09-17 DIAGNOSIS — D6859 Other primary thrombophilia: Secondary | ICD-10-CM

## 2011-09-17 LAB — POCT INR: INR: 5.3

## 2011-09-19 ENCOUNTER — Ambulatory Visit (INDEPENDENT_AMBULATORY_CARE_PROVIDER_SITE_OTHER): Payer: Managed Care, Other (non HMO) | Admitting: Family Medicine

## 2011-09-19 ENCOUNTER — Encounter: Payer: Self-pay | Admitting: Family Medicine

## 2011-09-19 VITALS — BP 110/68 | HR 72 | Temp 98.7°F | Wt 125.8 lb

## 2011-09-19 DIAGNOSIS — M549 Dorsalgia, unspecified: Secondary | ICD-10-CM

## 2011-09-19 MED ORDER — CYCLOBENZAPRINE HCL 10 MG PO TABS: 10.0000 mg | ORAL_TABLET | Freq: Three times a day (TID) | ORAL | Status: AC | PRN

## 2011-09-19 NOTE — Patient Instructions (Signed)
Back Pain, Adult Low back pain is very common. About 1 in 5 people have back pain.The cause of low back pain is rarely dangerous. The pain often gets better over time.About half of people with a sudden onset of back pain feel better in just 2 weeks. About 8 in 10 people feel better by 6 weeks.  CAUSES Some common causes of back pain include:  Strain of the muscles or ligaments supporting the spine.   Wear and tear (degeneration) of the spinal discs.   Arthritis.   Direct injury to the back.  DIAGNOSIS Most of the time, the direct cause of low back pain is not known.However, back pain can be treated effectively even when the exact cause of the pain is unknown.Answering your caregiver's questions about your overall health and symptoms is one of the most accurate ways to make sure the cause of your pain is not dangerous. If your caregiver needs more information, he or she may order lab work or imaging tests (X-rays or MRIs).However, even if imaging tests show changes in your back, this usually does not require surgery. HOME CARE INSTRUCTIONS For many people, back pain returns.Since low back pain is rarely dangerous, it is often a condition that people can learn to manageon their own.   Remain active. It is stressful on the back to sit or stand in one place. Do not sit, drive, or stand in one place for more than 30 minutes at a time. Take short walks on level surfaces as soon as pain allows.Try to increase the length of time you walk each day.   Do not stay in bed.Resting more than 1 or 2 days can delay your recovery.   Do not avoid exercise or work.Your body is made to move.It is not dangerous to be active, even though your back may hurt.Your back will likely heal faster if you return to being active before your pain is gone.   Pay attention to your body when you bend and lift. Many people have less discomfortwhen lifting if they bend their knees, keep the load close to their  bodies,and avoid twisting. Often, the most comfortable positions are those that put less stress on your recovering back.   Find a comfortable position to sleep. Use a firm mattress and lie on your side with your knees slightly bent. If you lie on your back, put a pillow under your knees.   Only take over-the-counter or prescription medicines as directed by your caregiver. Over-the-counter medicines to reduce pain and inflammation are often the most helpful.Your caregiver may prescribe muscle relaxant drugs.These medicines help dull your pain so you can more quickly return to your normal activities and healthy exercise.   Put ice on the injured area.   Put ice in a plastic bag.   Place a towel between your skin and the bag.   Leave the ice on for 15 to 20 minutes, 3 to 4 times a day for the first 2 to 3 days. After that, ice and heat may be alternated to reduce pain and spasms.   Ask your caregiver about trying back exercises and gentle massage. This may be of some benefit.   Avoid feeling anxious or stressed.Stress increases muscle tension and can worsen back pain.It is important to recognize when you are anxious or stressed and learn ways to manage it.Exercise is a great option.  SEEK MEDICAL CARE IF:  You have pain that is not relieved with rest or medicine.   You have   pain that does not improve in 1 week.   You have new symptoms.   You are generally not feeling well.  SEEK IMMEDIATE MEDICAL CARE IF:   You have pain that radiates from your back into your legs.   You develop new bowel or bladder control problems.   You have unusual weakness or numbness in your arms or legs.   You develop nausea or vomiting.   You develop abdominal pain.   You feel faint.  Document Released: 06/24/2005 Document Revised: 06/13/2011 Document Reviewed: 11/12/2010 [employer] Patient Information 2012 [employer]. 

## 2011-09-19 NOTE — Progress Notes (Signed)
  Subjective:    Chloe Miyoshi is a 36 y.o. female who presents for evaluation of low back pain. The patient has had no prior back problems. Symptoms have been present for 5 days and are gradually worsening.  Onset was related to / precipitated by dancing with x box and painting. The pain is located in the left lumbar area and radiates to the buttock. The pain is described as aching and occurs all day. She rates her pain as a 8 on a scale of 0-10. Symptoms are exacerbated by exercise, extension, flexion, sitting and twisting. Symptoms are improved by NSAIDs. She has also tried nothing which provided no symptom relief. She has no other symptoms associated with the back pain. The patient has no "red flag" history indicative of complicated back pain.  The following portions of the patient's history were reviewed and updated as appropriate: allergies, current medications, past family history, past medical history, past social history, past surgical history and problem list.  Review of Systems Pertinent items are noted in HPI.    Objective:   Inspection and palpation: inspection of back is normal. Muscle tone and ROM exam: full range of motion with pain, antalgic gait. Straight leg raise: from but with pain at end at - degrees bilaterally. Neurological: normal DTRs, muscle strength and reflexes.    Assessment:    Nonspecific acute low back pain    Plan:    Natural history and expected course discussed. Questions answered. Agricultural engineer distributed. Short (2-4 day) period of relative rest recommended until acute symptoms improve. Ice to affected area as needed for local pain relief. Heat to affected area as needed for local pain relief. NSAIDs per medication orders. Muscle relaxants per medication orders. Follow-up in 2 weeks.

## 2011-09-20 ENCOUNTER — Other Ambulatory Visit: Payer: Self-pay | Admitting: Family Medicine

## 2011-09-20 DIAGNOSIS — M545 Low back pain, unspecified: Secondary | ICD-10-CM

## 2011-09-23 ENCOUNTER — Telehealth: Payer: Self-pay | Admitting: Family Medicine

## 2011-09-23 NOTE — Telephone Encounter (Signed)
In reference to Order for MRI LS, I contacted patient to inform her that insurance approved the MRI.  Patient states she woke up this morning and is feeling much better today, and believes her problem was muscle related instead of disc.  I informed patient her insurance authorization number is valid for 30 days from today, and if her symptoms return or worsen to please let me know & I will schedule her for the MRI.

## 2011-09-24 LAB — POCT INR: INR: 2.4

## 2011-09-24 NOTE — Telephone Encounter (Signed)
ok 

## 2011-09-25 ENCOUNTER — Ambulatory Visit: Payer: Self-pay | Admitting: Cardiovascular Disease

## 2011-09-25 DIAGNOSIS — I82409 Acute embolism and thrombosis of unspecified deep veins of unspecified lower extremity: Secondary | ICD-10-CM

## 2011-09-25 DIAGNOSIS — D6859 Other primary thrombophilia: Secondary | ICD-10-CM

## 2011-10-07 ENCOUNTER — Ambulatory Visit (INDEPENDENT_AMBULATORY_CARE_PROVIDER_SITE_OTHER): Payer: Managed Care, Other (non HMO) | Admitting: Cardiology

## 2011-10-07 DIAGNOSIS — I82409 Acute embolism and thrombosis of unspecified deep veins of unspecified lower extremity: Secondary | ICD-10-CM

## 2011-10-07 DIAGNOSIS — D6859 Other primary thrombophilia: Secondary | ICD-10-CM

## 2011-10-16 ENCOUNTER — Ambulatory Visit: Payer: Self-pay | Admitting: Cardiology

## 2011-10-16 DIAGNOSIS — I82409 Acute embolism and thrombosis of unspecified deep veins of unspecified lower extremity: Secondary | ICD-10-CM

## 2011-10-16 DIAGNOSIS — D6859 Other primary thrombophilia: Secondary | ICD-10-CM

## 2011-10-16 LAB — POCT INR: INR: 3.1

## 2011-10-29 ENCOUNTER — Ambulatory Visit (INDEPENDENT_AMBULATORY_CARE_PROVIDER_SITE_OTHER): Payer: Managed Care, Other (non HMO)

## 2011-10-29 ENCOUNTER — Ambulatory Visit: Payer: Self-pay | Admitting: Cardiovascular Disease

## 2011-10-29 DIAGNOSIS — I82409 Acute embolism and thrombosis of unspecified deep veins of unspecified lower extremity: Secondary | ICD-10-CM

## 2011-10-29 DIAGNOSIS — D6859 Other primary thrombophilia: Secondary | ICD-10-CM

## 2011-10-29 DIAGNOSIS — Z111 Encounter for screening for respiratory tuberculosis: Secondary | ICD-10-CM

## 2011-10-29 LAB — POCT INR: INR: 3.3

## 2011-10-31 LAB — TB SKIN TEST
Induration: 0
TB Skin Test: NEGATIVE mm

## 2011-11-11 ENCOUNTER — Other Ambulatory Visit: Payer: Self-pay | Admitting: Family Medicine

## 2011-11-11 DIAGNOSIS — F988 Other specified behavioral and emotional disorders with onset usually occurring in childhood and adolescence: Secondary | ICD-10-CM

## 2011-11-11 MED ORDER — LISDEXAMFETAMINE DIMESYLATE 70 MG PO CAPS: ORAL_CAPSULE | ORAL | Status: DC

## 2011-11-11 MED ORDER — LISDEXAMFETAMINE DIMESYLATE 70 MG PO CAPS: 70.0000 mg | ORAL_CAPSULE | ORAL | Status: DC

## 2011-11-13 ENCOUNTER — Ambulatory Visit: Payer: Self-pay | Admitting: Cardiovascular Disease

## 2011-11-13 DIAGNOSIS — D6859 Other primary thrombophilia: Secondary | ICD-10-CM

## 2011-11-13 DIAGNOSIS — I82409 Acute embolism and thrombosis of unspecified deep veins of unspecified lower extremity: Secondary | ICD-10-CM

## 2011-11-13 LAB — POCT INR: INR: 4.1

## 2011-11-14 ENCOUNTER — Encounter: Payer: Self-pay | Admitting: *Deleted

## 2011-11-19 ENCOUNTER — Ambulatory Visit (INDEPENDENT_AMBULATORY_CARE_PROVIDER_SITE_OTHER): Payer: Self-pay | Admitting: Internal Medicine

## 2011-11-19 DIAGNOSIS — D6859 Other primary thrombophilia: Secondary | ICD-10-CM

## 2011-11-19 DIAGNOSIS — I82409 Acute embolism and thrombosis of unspecified deep veins of unspecified lower extremity: Secondary | ICD-10-CM

## 2011-11-29 ENCOUNTER — Ambulatory Visit: Payer: Self-pay | Admitting: Cardiology

## 2011-11-29 DIAGNOSIS — I82409 Acute embolism and thrombosis of unspecified deep veins of unspecified lower extremity: Secondary | ICD-10-CM

## 2011-11-29 DIAGNOSIS — D6859 Other primary thrombophilia: Secondary | ICD-10-CM

## 2011-12-13 ENCOUNTER — Ambulatory Visit: Payer: Self-pay | Admitting: Cardiology

## 2011-12-13 DIAGNOSIS — D6859 Other primary thrombophilia: Secondary | ICD-10-CM

## 2011-12-13 DIAGNOSIS — I82409 Acute embolism and thrombosis of unspecified deep veins of unspecified lower extremity: Secondary | ICD-10-CM

## 2011-12-16 ENCOUNTER — Ambulatory Visit: Payer: Self-pay | Admitting: Cardiology

## 2011-12-16 DIAGNOSIS — D6859 Other primary thrombophilia: Secondary | ICD-10-CM

## 2011-12-16 DIAGNOSIS — I82409 Acute embolism and thrombosis of unspecified deep veins of unspecified lower extremity: Secondary | ICD-10-CM

## 2011-12-16 LAB — POCT INR: INR: 3.7

## 2011-12-19 NOTE — Progress Notes (Signed)
I can not close this 

## 2011-12-25 ENCOUNTER — Ambulatory Visit: Payer: Self-pay | Admitting: Cardiology

## 2011-12-25 DIAGNOSIS — I82409 Acute embolism and thrombosis of unspecified deep veins of unspecified lower extremity: Secondary | ICD-10-CM

## 2011-12-25 DIAGNOSIS — D6859 Other primary thrombophilia: Secondary | ICD-10-CM

## 2011-12-30 ENCOUNTER — Ambulatory Visit (INDEPENDENT_AMBULATORY_CARE_PROVIDER_SITE_OTHER): Payer: Managed Care, Other (non HMO) | Admitting: Family Medicine

## 2011-12-30 ENCOUNTER — Encounter: Payer: Self-pay | Admitting: Family Medicine

## 2011-12-30 VITALS — BP 100/66 | HR 65 | Temp 98.3°F | Wt 119.6 lb

## 2011-12-30 DIAGNOSIS — L0390 Cellulitis, unspecified: Secondary | ICD-10-CM

## 2011-12-30 DIAGNOSIS — L0291 Cutaneous abscess, unspecified: Secondary | ICD-10-CM

## 2011-12-30 DIAGNOSIS — F988 Other specified behavioral and emotional disorders with onset usually occurring in childhood and adolescence: Secondary | ICD-10-CM

## 2011-12-30 MED ORDER — SULFAMETHOXAZOLE-TRIMETHOPRIM 800-160 MG PO TABS: 1.0000 | ORAL_TABLET | Freq: Two times a day (BID) | ORAL | Status: AC

## 2011-12-30 MED ORDER — LISDEXAMFETAMINE DIMESYLATE 70 MG PO CAPS: 70.0000 mg | ORAL_CAPSULE | ORAL | Status: DC

## 2011-12-30 NOTE — Progress Notes (Signed)
  Subjective:    Patient ID: Michele Turner, female    DOB: 06-Apr-1976, 36 y.o.   MRN: [id number]  HPI  Pt was stung by yellow jacket last Thursday and progressively became more red and tender.  + pruritic. No other complaints.     Review of Systems As above    Objective:   Physical Exam  Constitutional: She is oriented to person, place, and time. She appears well-developed and well-nourished.  Neurological: She is alert and oriented to person, place, and time.  Skin:       R ankle--- back of ankle / low leg----+ warm to touch + escoriations with surrounding errythema  Psychiatric: She has a normal mood and affect. Her behavior is normal. Judgment and thought content normal.          Assessment & Plan:

## 2011-12-30 NOTE — Assessment & Plan Note (Signed)
Bactrim ds for 10 days No calf pain

## 2011-12-30 NOTE — Patient Instructions (Signed)

## 2012-01-02 ENCOUNTER — Ambulatory Visit: Payer: Self-pay | Admitting: Cardiology

## 2012-01-02 DIAGNOSIS — I82409 Acute embolism and thrombosis of unspecified deep veins of unspecified lower extremity: Secondary | ICD-10-CM

## 2012-01-02 DIAGNOSIS — D6859 Other primary thrombophilia: Secondary | ICD-10-CM

## 2012-01-16 ENCOUNTER — Ambulatory Visit: Payer: Self-pay | Admitting: Cardiology

## 2012-01-16 DIAGNOSIS — I82409 Acute embolism and thrombosis of unspecified deep veins of unspecified lower extremity: Secondary | ICD-10-CM

## 2012-01-16 DIAGNOSIS — D6859 Other primary thrombophilia: Secondary | ICD-10-CM

## 2012-01-16 LAB — POCT INR: INR: 2.9

## 2012-01-22 ENCOUNTER — Other Ambulatory Visit: Payer: Self-pay | Admitting: Cardiology

## 2012-01-22 MED ORDER — WARFARIN SODIUM 5 MG PO TABS: ORAL_TABLET | ORAL | Status: DC

## 2012-02-04 ENCOUNTER — Ambulatory Visit: Payer: Self-pay | Admitting: General Practice

## 2012-02-04 DIAGNOSIS — I82409 Acute embolism and thrombosis of unspecified deep veins of unspecified lower extremity: Secondary | ICD-10-CM

## 2012-02-04 DIAGNOSIS — D6859 Other primary thrombophilia: Secondary | ICD-10-CM

## 2012-02-04 LAB — POCT INR: INR: 2.7

## 2012-02-20 ENCOUNTER — Ambulatory Visit: Payer: Self-pay | Admitting: Cardiology

## 2012-02-20 DIAGNOSIS — D6859 Other primary thrombophilia: Secondary | ICD-10-CM

## 2012-02-20 DIAGNOSIS — I82409 Acute embolism and thrombosis of unspecified deep veins of unspecified lower extremity: Secondary | ICD-10-CM

## 2012-02-27 ENCOUNTER — Other Ambulatory Visit: Payer: Self-pay | Admitting: Family Medicine

## 2012-02-27 MED ORDER — FLUTICASONE FUROATE 27.5 MCG/SPRAY NA SUSP: 2.0000 | Freq: Every day | NASAL | Status: DC

## 2012-02-27 NOTE — Telephone Encounter (Signed)
Refill  VERAMYST 27.5 MCG/SPRAY use 2 sprays in each nostril every morning Requesting 30-day supply with 11 refills  Last fill 12.17.12 Last ov 3.14.13 - acute back pain

## 2012-03-02 ENCOUNTER — Ambulatory Visit (INDEPENDENT_AMBULATORY_CARE_PROVIDER_SITE_OTHER): Payer: Managed Care, Other (non HMO) | Admitting: Family Medicine

## 2012-03-02 ENCOUNTER — Encounter: Payer: Self-pay | Admitting: Family Medicine

## 2012-03-02 VITALS — BP 116/72 | HR 74 | Temp 98.3°F | Wt 122.0 lb

## 2012-03-02 DIAGNOSIS — J029 Acute pharyngitis, unspecified: Secondary | ICD-10-CM

## 2012-03-02 DIAGNOSIS — F988 Other specified behavioral and emotional disorders with onset usually occurring in childhood and adolescence: Secondary | ICD-10-CM

## 2012-03-02 MED ORDER — LISDEXAMFETAMINE DIMESYLATE 70 MG PO CAPS: 70.0000 mg | ORAL_CAPSULE | ORAL | Status: DC

## 2012-03-02 MED ORDER — FLUCONAZOLE 150 MG PO TABS: 150.0000 mg | ORAL_TABLET | Freq: Once | ORAL | Status: DC

## 2012-03-02 MED ORDER — AMOXICILLIN 875 MG PO TABS: 875.0000 mg | ORAL_TABLET | Freq: Two times a day (BID) | ORAL | Status: AC

## 2012-03-02 NOTE — Assessment & Plan Note (Signed)
Refill vyvanse 

## 2012-03-02 NOTE — Progress Notes (Signed)
  Subjective:     Michele Turner is a 36 y.o. female who presents for evaluation of sore throat. Associated symptoms include bilateral ear fullness, enlarged tonsils, nasal blockage, pain while swallowing, post nasal drip, sinus and nasal congestion and sore throat. Onset of symptoms was 2 weeks ago, and have been gradually worsening since that time. She is drinking plenty of fluids. She has not had a recent close exposure to someone with proven streptococcal pharyngitis.  The following portions of the patient's history were reviewed and updated as appropriate: allergies, current medications, past family history, past medical history, past social history, past surgical history and problem list.  Review of Systems Pertinent items are noted in HPI.    Objective:    BP 116/72  Pulse 74  Temp 98.3 F (36.8 C) (Oral)  Wt 122 lb (55.339 kg)  SpO2 97% General appearance: alert, cooperative, appears stated age and no distress Ears: normal TM's and external ear canals both ears Nose: clear discharge, mild congestion, no sinus tenderness Throat: abnormal findings: exudates present and marked oropharyngeal erythema Neck: mild anterior cervical adenopathy, supple, symmetrical, trachea midline and thyroid not enlarged, symmetric, no tenderness/mass/nodules Lungs: clear to auscultation bilaterally  Laboratory Strep test done. Results:negative. ---clinically +---- throat culture sent to lab   Assessment:    Acute pharyngitis, likely  Strep throat.    Plan:    Patient placed on antibiotics. Use of OTC analgesics recommended as well as salt water gargles. Patient advised that he will be infectious for 24 hours after starting antibiotics. Follow up as needed. f.u prn

## 2012-03-02 NOTE — Patient Instructions (Signed)
Strep Infections  Streptococcal (strep) infections are caused by streptococcal germs (bacteria). Strep infections are very contagious. Strep infections can occur in:   Ears.   The nose.   The throat.   Sinuses.   Skin.   Blood.   Lungs.   Spinal fluid.   Urine.  Strep throat is the most common bacterial infection in children. The symptoms of a Strep infection usually get better in 2 to 3 days after starting medicine that kills germs (antibiotics). Strep is usually not contagious after 36 to 48 hours of antibiotic treatment. Strep infections that are not treated can cause serious complications. These include gland infections, throat abscess, rheumatic fever and kidney disease.  DIAGNOSIS   The diagnosis of strep is made by:   A culture for the strep germ.  TREATMENT   These infections require oral antibiotics for a full 10 days, an antibiotic shot or antibiotics given into the vein (intravenous, IV).  HOME CARE INSTRUCTIONS    Be sure to finish all antibiotics even if feeling better.   Only take over-the-counter medicines for pain, discomfort and or fever, as directed by your caregiver.   Close contacts that have a fever, sore throat or illness symptoms should see their caregiver right away.   You or your child may return to work, school or daycare if the fever and pain are better in 2 to 3 days after starting antibiotics.  SEEK MEDICAL CARE IF:    You or your child has an oral temperature above 102 F (38.9 C).   Your baby is older than 3 months with a rectal temperature of 100.5 F (38.1 C) or higher for more than 1 day.   You or your child is not better in 3 days.  SEEK IMMEDIATE MEDICAL CARE IF:    You or your child has an oral temperature above 102 F (38.9 C), not controlled by medicine.   Your baby is older than 3 months with a rectal temperature of 102 F (38.9 C) or higher.   Your baby is 3 months old or younger with a rectal temperature of 100.4 F (38 C) or higher.   There is a  spreading rash.   There is difficulty swallowing or breathing.   There is increased pain or swelling.  Document Released: 08/01/2004 Document Revised: 06/13/2011 Document Reviewed: 05/10/2009  ExitCare Patient Information 2012 [employer].

## 2012-03-05 LAB — CULTURE, GROUP A STREP: Organism ID, Bacteria: NORMAL

## 2012-03-08 LAB — POCT INR: INR: 4.3

## 2012-03-11 ENCOUNTER — Ambulatory Visit: Payer: Self-pay | Admitting: Internal Medicine

## 2012-03-11 DIAGNOSIS — D6859 Other primary thrombophilia: Secondary | ICD-10-CM

## 2012-03-11 DIAGNOSIS — I82409 Acute embolism and thrombosis of unspecified deep veins of unspecified lower extremity: Secondary | ICD-10-CM

## 2012-03-13 LAB — POCT INR: INR: 2.6

## 2012-03-16 ENCOUNTER — Ambulatory Visit: Payer: Self-pay | Admitting: Internal Medicine

## 2012-03-16 DIAGNOSIS — D6859 Other primary thrombophilia: Secondary | ICD-10-CM

## 2012-03-16 DIAGNOSIS — I82409 Acute embolism and thrombosis of unspecified deep veins of unspecified lower extremity: Secondary | ICD-10-CM

## 2012-04-03 LAB — POCT INR: INR: 3.2

## 2012-04-07 ENCOUNTER — Ambulatory Visit: Payer: Self-pay | Admitting: Cardiovascular Disease

## 2012-04-07 DIAGNOSIS — I82409 Acute embolism and thrombosis of unspecified deep veins of unspecified lower extremity: Secondary | ICD-10-CM

## 2012-04-07 DIAGNOSIS — D6859 Other primary thrombophilia: Secondary | ICD-10-CM

## 2012-04-17 ENCOUNTER — Ambulatory Visit (INDEPENDENT_AMBULATORY_CARE_PROVIDER_SITE_OTHER): Payer: Managed Care, Other (non HMO)

## 2012-04-17 DIAGNOSIS — Z23 Encounter for immunization: Secondary | ICD-10-CM

## 2012-04-20 ENCOUNTER — Ambulatory Visit: Payer: Self-pay | Admitting: Cardiovascular Disease

## 2012-04-20 DIAGNOSIS — D6859 Other primary thrombophilia: Secondary | ICD-10-CM

## 2012-05-05 ENCOUNTER — Ambulatory Visit: Payer: Self-pay | Admitting: Cardiology

## 2012-05-05 DIAGNOSIS — I82409 Acute embolism and thrombosis of unspecified deep veins of unspecified lower extremity: Secondary | ICD-10-CM

## 2012-05-05 DIAGNOSIS — D6859 Other primary thrombophilia: Secondary | ICD-10-CM

## 2012-05-20 ENCOUNTER — Other Ambulatory Visit: Payer: Self-pay | Admitting: *Deleted

## 2012-05-20 MED ORDER — WARFARIN SODIUM 5 MG PO TABS: ORAL_TABLET | ORAL | Status: DC

## 2012-05-22 ENCOUNTER — Ambulatory Visit (INDEPENDENT_AMBULATORY_CARE_PROVIDER_SITE_OTHER): Payer: Managed Care, Other (non HMO) | Admitting: Family Medicine

## 2012-05-22 ENCOUNTER — Emergency Department (HOSPITAL_BASED_OUTPATIENT_CLINIC_OR_DEPARTMENT_OTHER)
Admission: EM | Admit: 2012-05-22 | Discharge: 2012-05-22 | Discharge status: Against Medical Advice | Payer: Managed Care, Other (non HMO)

## 2012-05-22 ENCOUNTER — Ambulatory Visit (HOSPITAL_BASED_OUTPATIENT_CLINIC_OR_DEPARTMENT_OTHER)
Admission: RE | Admit: 2012-05-22 | Discharge: 2012-05-22 | Disposition: A | Discharge status: Home / Self Care | Payer: Managed Care, Other (non HMO) | Source: Ambulatory Visit | Attending: Family Medicine | Admitting: Family Medicine

## 2012-05-22 ENCOUNTER — Encounter: Payer: Self-pay | Admitting: Family Medicine

## 2012-05-22 ENCOUNTER — Ambulatory Visit: Payer: Self-pay | Admitting: Cardiovascular Disease

## 2012-05-22 ENCOUNTER — Encounter (HOSPITAL_BASED_OUTPATIENT_CLINIC_OR_DEPARTMENT_OTHER): Payer: Self-pay | Admitting: *Deleted

## 2012-05-22 VITALS — BP 112/68 | HR 62 | Temp 98.0°F | Wt 121.8 lb

## 2012-05-22 DIAGNOSIS — R109 Unspecified abdominal pain: Secondary | ICD-10-CM

## 2012-05-22 DIAGNOSIS — F988 Other specified behavioral and emotional disorders with onset usually occurring in childhood and adolescence: Secondary | ICD-10-CM

## 2012-05-22 DIAGNOSIS — D6859 Other primary thrombophilia: Secondary | ICD-10-CM

## 2012-05-22 LAB — POCT URINALYSIS DIPSTICK
Bilirubin, UA: NEGATIVE
Ketones, UA: NEGATIVE
Leukocytes, UA: NEGATIVE
pH, UA: 6.5

## 2012-05-22 LAB — CBC WITH DIFFERENTIAL/PLATELET
Basophils Absolute: 0.1 10*3/uL (ref 0.0–0.1)
HCT: 41.2 % (ref 36.0–46.0)
Hemoglobin: 14.5 g/dL (ref 12.0–15.0)
Lymphocytes Relative: 29 % (ref 12–46)
Lymphs Abs: 1.7 10*3/uL (ref 0.7–4.0)
MCV: 87.7 fL (ref 78.0–100.0)
Monocytes Absolute: 0.8 10*3/uL (ref 0.1–1.0)
Neutro Abs: 3.1 10*3/uL (ref 1.7–7.7)
RBC: 4.7 MIL/uL (ref 3.87–5.11)
RDW: 12.8 % (ref 11.5–15.5)
WBC: 5.6 10*3/uL (ref 4.0–10.5)

## 2012-05-22 LAB — POCT URINE PREGNANCY: Preg Test, Ur: NEGATIVE

## 2012-05-22 MED ORDER — LISDEXAMFETAMINE DIMESYLATE 70 MG PO CAPS: 70.0000 mg | ORAL_CAPSULE | ORAL | Status: DC

## 2012-05-22 NOTE — ED Notes (Signed)
Pt amb to triage with quick steady gait smiling, reports abd pain x Wednesday that has moved from her left lq to right lq, pt saw her pcp today and was told to come ed for ultrasound.

## 2012-05-22 NOTE — Patient Instructions (Addendum)
Pelvic Pain, Female Female pelvic pain can be caused by many different things and start from a variety of places. Pelvic pain refers to pain that is located in the lower half of the abdomen and between your hips. The pain may occur over a short period of time (acute) or may be reoccurring (chronic). The cause of pelvic pain may be related to disorders affecting the female reproductive organs (gynecologic), but it may also be related to the bladder, kidney stones, an intestinal complication, or muscle or skeletal problems. Getting help right away for pelvic pain is important, especially if there has been severe, sharp, or a sudden onset of unusual pain. It is also important to get help right away because some types of pelvic pain can be life threatening.  CAUSES  Below are only some of the causes of pelvic pain. The causes of pelvic pain can be in one of several categories.   Gynecologic.  Pelvic inflammatory disease.  Sexually transmitted infection.  Ovarian cyst or a twisted ovarian ligament (ovarian torsion).  Uterine lining that grows outside the uterus (endometriosis).  Fibroids, cysts, or tumors.  Ovulation.  Pregnancy.  Pregnancy that occurs outside the uterus (ectopic pregnancy).  Miscarriage.  Labor.  Abruption of the placenta or ruptured uterus.  Infection.  Uterine infection (endometritis).  Bladder infection.  Diverticulitis.  Miscarriage related to a uterine infection (septic abortion).  Bladder.  Inflammation of the bladder (cystitis).  Kidney stone(s).  Gastrointenstinal.  Constipation.  Diverticulitis.  Neurologic.  Trauma.  Feeling pelvic pain because of mental or emotional causes (psychosomatic).  Cancers of the bowel or pelvis. EVALUATION  Your caregiver will want to take a careful history of your concerns. This includes recent changes in your health, a careful gynecologic history of your periods (menses), and a sexual history. Obtaining  your family history and medical history is also important. Your caregiver may suggest a pelvic exam. A pelvic exam will help identify the location and severity of the pain. It also helps in the evaluation of which organ system may be involved. In order to identify the cause of the pelvic pain and be properly treated, your caregiver may order tests. These tests may include:   A pregnancy test.  Pelvic ultrasonography.  An X-ray exam of the abdomen.  A urinalysis or evaluation of vaginal discharge.  Blood tests. HOME CARE INSTRUCTIONS   Only take over-the-counter or prescription medicines for pain, discomfort, or fever as directed by your caregiver.   Rest as directed by your caregiver.   Eat a balanced diet.   Drink enough fluids to make your urine clear or pale yellow, or as directed.   Avoid sexual intercourse if it causes pain.   Apply warm or cold compresses to the lower abdomen depending on which one helps the pain.   Avoid stressful situations.   Keep a journal of your pelvic pain. Write down when it started, where the pain is located, and if there are things that seem to be associated with the pain, such as food or your menstrual cycle.  Follow up with your caregiver as directed.  SEEK MEDICAL CARE IF:  Your medicine does not help your pain.  You have abnormal vaginal discharge. SEEK IMMEDIATE MEDICAL CARE IF:   You have heavy bleeding from the vagina.   Your pelvic pain increases.   You feel lightheaded or faint.   You have chills.   You have pain with urination or blood in your urine.   You have uncontrolled   diarrhea or vomiting.   You have a fever or persistent symptoms for more than 3 days.  You have a fever and your symptoms suddenly get worse.   You are being physically or sexually abused.  MAKE SURE YOU:  Understand these instructions.  Will watch your condition.  Will get help if you are not doing well or get worse. Document  Released: 05/21/2004 Document Revised: 12/24/2011 Document Reviewed: 10/14/2011 [employer] Patient Information 2013 [employer].  

## 2012-05-22 NOTE — Progress Notes (Signed)
  Subjective:     Michele Turner is a 36 y.o. female who presents for evaluation of abdominal pain. Onset was 3 days ago. Symptoms have been gradually improving. The pain is described as aching and cramping, and is 3/10 in intensity. Pain is located in the suprapubic region without radiation.  Aggravating factors: bowel movement.  Alleviating factors: simethecone. Associated symptoms: none. The patient denies anorexia, arthralagias, belching, chills, constipation, diarrhea, fever, flatus, frequency, headache, hematochezia, hematuria, melena, myalgias, nausea, sweats and vomiting.  The patient's history has been marked as reviewed and updated as appropriate.  Review of Systems Pertinent items are noted in HPI.     Objective:    BP 112/68  Pulse 62  Temp 98 F (36.7 C) (Oral)  Wt 121 lb 12.8 oz (55.248 kg)  SpO2 98% General appearance: alert, cooperative, appears stated age and no distress Abdomen: abnormal findings:  mild tenderness suprapubic area and r and L Low quadrant    Assessment:    Abdominal pain---? mirena vs gas  .    Plan:    See orders for lab and imaging studies. Reassured patient that symptoms are almost certainly benign and self-resolving. Further follow-up plans will be based on outcome of lab/imaging studies; see orders.

## 2012-05-22 NOTE — ED Notes (Signed)
Denies any n/v/d/fevers or other c/o.

## 2012-05-22 NOTE — Addendum Note (Signed)
Addended by: Silvio Pate D on: 05/22/2012 04:50 PM   Modules accepted: Orders

## 2012-05-23 LAB — BASIC METABOLIC PANEL
Calcium: 9.3 mg/dL (ref 8.4–10.5)
Glucose, Bld: 77 mg/dL (ref 70–99)
Potassium: 3.9 mEq/L (ref 3.5–5.3)
Sodium: 137 mEq/L (ref 135–145)

## 2012-05-23 LAB — HEPATIC FUNCTION PANEL
ALT: 20 U/L (ref 0–35)
Alkaline Phosphatase: 37 U/L — ABNORMAL LOW (ref 39–117)
Bilirubin, Direct: 0.1 mg/dL (ref 0.0–0.3)
Indirect Bilirubin: 0.4 mg/dL (ref 0.0–0.9)

## 2012-05-29 ENCOUNTER — Other Ambulatory Visit: Payer: Self-pay

## 2012-05-29 MED ORDER — WARFARIN SODIUM 5 MG PO TABS: ORAL_TABLET | ORAL | Status: DC

## 2012-06-02 ENCOUNTER — Ambulatory Visit: Payer: Self-pay | Admitting: Internal Medicine

## 2012-06-02 DIAGNOSIS — D6859 Other primary thrombophilia: Secondary | ICD-10-CM

## 2012-06-02 DIAGNOSIS — I82409 Acute embolism and thrombosis of unspecified deep veins of unspecified lower extremity: Secondary | ICD-10-CM

## 2012-06-15 LAB — POCT INR: INR: 3.8

## 2012-06-16 ENCOUNTER — Ambulatory Visit: Payer: Self-pay | Admitting: Cardiovascular Disease

## 2012-06-16 DIAGNOSIS — I82409 Acute embolism and thrombosis of unspecified deep veins of unspecified lower extremity: Secondary | ICD-10-CM

## 2012-06-16 DIAGNOSIS — D6859 Other primary thrombophilia: Secondary | ICD-10-CM

## 2012-06-24 LAB — POCT INR: INR: 2.5

## 2012-06-30 ENCOUNTER — Ambulatory Visit (INDEPENDENT_AMBULATORY_CARE_PROVIDER_SITE_OTHER): Payer: Managed Care, Other (non HMO) | Admitting: Cardiology

## 2012-06-30 DIAGNOSIS — D6859 Other primary thrombophilia: Secondary | ICD-10-CM

## 2012-06-30 DIAGNOSIS — I82409 Acute embolism and thrombosis of unspecified deep veins of unspecified lower extremity: Secondary | ICD-10-CM

## 2012-07-13 ENCOUNTER — Ambulatory Visit: Payer: Self-pay | Admitting: Cardiology

## 2012-07-13 DIAGNOSIS — D6859 Other primary thrombophilia: Secondary | ICD-10-CM

## 2012-07-13 DIAGNOSIS — I82409 Acute embolism and thrombosis of unspecified deep veins of unspecified lower extremity: Secondary | ICD-10-CM

## 2012-07-13 LAB — POCT INR: INR: 2.4

## 2012-07-17 ENCOUNTER — Ambulatory Visit: Payer: Self-pay | Admitting: Cardiology

## 2012-07-17 DIAGNOSIS — I82409 Acute embolism and thrombosis of unspecified deep veins of unspecified lower extremity: Secondary | ICD-10-CM

## 2012-07-17 DIAGNOSIS — D6859 Other primary thrombophilia: Secondary | ICD-10-CM

## 2012-07-17 LAB — POCT INR: INR: 2.5

## 2012-07-25 ENCOUNTER — Other Ambulatory Visit: Payer: Self-pay | Admitting: Family Medicine

## 2012-07-25 DIAGNOSIS — T304 Corrosion of unspecified body region, unspecified degree: Secondary | ICD-10-CM

## 2012-07-25 MED ORDER — SILVER SULFADIAZINE 1 % EX CREA: TOPICAL_CREAM | Freq: Every day | CUTANEOUS | Status: DC

## 2012-07-25 MED ORDER — PREDNISONE 10 MG PO TABS: ORAL_TABLET | ORAL | Status: DC

## 2012-08-04 ENCOUNTER — Telehealth: Payer: Self-pay | Admitting: *Deleted

## 2012-08-04 LAB — POCT INR: INR: 4.5

## 2012-08-04 NOTE — Telephone Encounter (Signed)
Called and left message on voice mail at pts work and also at home regarding need for her to do INR  Was ordered to do one on 07/24/2012

## 2012-08-05 ENCOUNTER — Ambulatory Visit: Payer: Self-pay | Admitting: Internal Medicine

## 2012-08-05 DIAGNOSIS — D6859 Other primary thrombophilia: Secondary | ICD-10-CM

## 2012-08-05 DIAGNOSIS — I82409 Acute embolism and thrombosis of unspecified deep veins of unspecified lower extremity: Secondary | ICD-10-CM

## 2012-08-14 ENCOUNTER — Telehealth: Payer: Self-pay | Admitting: *Deleted

## 2012-08-14 NOTE — Telephone Encounter (Signed)
Call to remind to check INR as a self tester

## 2012-08-17 ENCOUNTER — Ambulatory Visit: Payer: Self-pay | Admitting: Cardiology

## 2012-08-17 DIAGNOSIS — D6859 Other primary thrombophilia: Secondary | ICD-10-CM

## 2012-08-17 DIAGNOSIS — I82409 Acute embolism and thrombosis of unspecified deep veins of unspecified lower extremity: Secondary | ICD-10-CM

## 2012-08-28 ENCOUNTER — Ambulatory Visit: Payer: Self-pay | Admitting: Cardiology

## 2012-08-28 DIAGNOSIS — I82409 Acute embolism and thrombosis of unspecified deep veins of unspecified lower extremity: Secondary | ICD-10-CM

## 2012-08-28 DIAGNOSIS — D6859 Other primary thrombophilia: Secondary | ICD-10-CM

## 2012-09-17 ENCOUNTER — Ambulatory Visit: Payer: Self-pay | Admitting: Cardiovascular Disease

## 2012-09-17 ENCOUNTER — Other Ambulatory Visit: Payer: Self-pay | Admitting: Family Medicine

## 2012-09-17 DIAGNOSIS — I82409 Acute embolism and thrombosis of unspecified deep veins of unspecified lower extremity: Secondary | ICD-10-CM

## 2012-09-17 DIAGNOSIS — F988 Other specified behavioral and emotional disorders with onset usually occurring in childhood and adolescence: Secondary | ICD-10-CM

## 2012-09-17 DIAGNOSIS — D6859 Other primary thrombophilia: Secondary | ICD-10-CM

## 2012-09-17 MED ORDER — LISDEXAMFETAMINE DIMESYLATE 70 MG PO CAPS: 70.0000 mg | ORAL_CAPSULE | ORAL | Status: DC

## 2012-09-17 MED ORDER — LISDEXAMFETAMINE DIMESYLATE 70 MG PO CAPS: ORAL_CAPSULE | ORAL | Status: DC

## 2012-10-01 ENCOUNTER — Ambulatory Visit (INDEPENDENT_AMBULATORY_CARE_PROVIDER_SITE_OTHER): Payer: Managed Care, Other (non HMO) | Admitting: Internal Medicine

## 2012-10-01 DIAGNOSIS — D6859 Other primary thrombophilia: Secondary | ICD-10-CM

## 2012-10-01 DIAGNOSIS — I82409 Acute embolism and thrombosis of unspecified deep veins of unspecified lower extremity: Secondary | ICD-10-CM

## 2012-10-01 LAB — POCT INR: INR: 3.3

## 2012-10-20 ENCOUNTER — Ambulatory Visit (INDEPENDENT_AMBULATORY_CARE_PROVIDER_SITE_OTHER): Payer: Managed Care, Other (non HMO) | Admitting: *Deleted

## 2012-10-20 DIAGNOSIS — Z0000 Encounter for general adult medical examination without abnormal findings: Secondary | ICD-10-CM

## 2012-10-21 ENCOUNTER — Ambulatory Visit (INDEPENDENT_AMBULATORY_CARE_PROVIDER_SITE_OTHER): Payer: Managed Care, Other (non HMO) | Admitting: Cardiology

## 2012-10-21 DIAGNOSIS — I82409 Acute embolism and thrombosis of unspecified deep veins of unspecified lower extremity: Secondary | ICD-10-CM

## 2012-10-21 DIAGNOSIS — D6859 Other primary thrombophilia: Secondary | ICD-10-CM

## 2012-10-21 LAB — POCT INR: INR: 4.1

## 2012-11-05 LAB — POCT INR: INR: 4.3

## 2012-11-06 ENCOUNTER — Telehealth: Payer: Self-pay | Admitting: Cardiology

## 2012-11-06 ENCOUNTER — Ambulatory Visit (INDEPENDENT_AMBULATORY_CARE_PROVIDER_SITE_OTHER): Payer: Managed Care, Other (non HMO) | Admitting: Cardiology

## 2012-11-06 DIAGNOSIS — D6859 Other primary thrombophilia: Secondary | ICD-10-CM

## 2012-11-06 DIAGNOSIS — I82409 Acute embolism and thrombosis of unspecified deep veins of unspecified lower extremity: Secondary | ICD-10-CM

## 2012-11-06 NOTE — Telephone Encounter (Signed)
New Prob     IRN is out of range : 4.3 concerned and would like to speak to nurse regarding this.

## 2012-11-06 NOTE — Telephone Encounter (Signed)
Spoke with Alere and advised this has been handled by coumadin clinic

## 2012-11-13 ENCOUNTER — Ambulatory Visit (INDEPENDENT_AMBULATORY_CARE_PROVIDER_SITE_OTHER): Payer: Managed Care, Other (non HMO) | Admitting: Cardiology

## 2012-11-13 DIAGNOSIS — D6859 Other primary thrombophilia: Secondary | ICD-10-CM

## 2012-11-13 DIAGNOSIS — I82409 Acute embolism and thrombosis of unspecified deep veins of unspecified lower extremity: Secondary | ICD-10-CM

## 2012-11-13 LAB — POCT INR: INR: 2.1

## 2012-11-20 ENCOUNTER — Ambulatory Visit (INDEPENDENT_AMBULATORY_CARE_PROVIDER_SITE_OTHER): Payer: Managed Care, Other (non HMO) | Admitting: Internal Medicine

## 2012-11-20 DIAGNOSIS — I82409 Acute embolism and thrombosis of unspecified deep veins of unspecified lower extremity: Secondary | ICD-10-CM

## 2012-11-20 DIAGNOSIS — D6859 Other primary thrombophilia: Secondary | ICD-10-CM

## 2012-11-20 LAB — POCT INR: INR: 2.4

## 2012-11-24 LAB — POCT INR: INR: 2.9

## 2012-11-25 ENCOUNTER — Ambulatory Visit (INDEPENDENT_AMBULATORY_CARE_PROVIDER_SITE_OTHER): Payer: Managed Care, Other (non HMO) | Admitting: Cardiology

## 2012-11-25 DIAGNOSIS — I82409 Acute embolism and thrombosis of unspecified deep veins of unspecified lower extremity: Secondary | ICD-10-CM

## 2012-11-25 DIAGNOSIS — D6859 Other primary thrombophilia: Secondary | ICD-10-CM

## 2012-11-27 ENCOUNTER — Encounter: Payer: Self-pay | Admitting: Family Medicine

## 2012-11-27 ENCOUNTER — Encounter (INDEPENDENT_AMBULATORY_CARE_PROVIDER_SITE_OTHER): Payer: Managed Care, Other (non HMO)

## 2012-11-27 ENCOUNTER — Ambulatory Visit (INDEPENDENT_AMBULATORY_CARE_PROVIDER_SITE_OTHER): Payer: Managed Care, Other (non HMO) | Admitting: Family Medicine

## 2012-11-27 VITALS — BP 128/84 | HR 77 | Temp 98.7°F | Wt 121.4 lb

## 2012-11-27 DIAGNOSIS — I82409 Acute embolism and thrombosis of unspecified deep veins of unspecified lower extremity: Secondary | ICD-10-CM

## 2012-11-27 DIAGNOSIS — M79662 Pain in left lower leg: Secondary | ICD-10-CM

## 2012-11-27 DIAGNOSIS — R0602 Shortness of breath: Secondary | ICD-10-CM

## 2012-11-27 DIAGNOSIS — M79609 Pain in unspecified limb: Secondary | ICD-10-CM

## 2012-11-27 DIAGNOSIS — Z86718 Personal history of other venous thrombosis and embolism: Secondary | ICD-10-CM

## 2012-11-27 DIAGNOSIS — R0789 Other chest pain: Secondary | ICD-10-CM

## 2012-11-27 LAB — CBC WITH DIFFERENTIAL/PLATELET
Eosinophils Relative: 0.8 % (ref 0.0–5.0)
HCT: 39.8 % (ref 36.0–46.0)
Lymphs Abs: 1.2 10*3/uL (ref 0.7–4.0)
Monocytes Relative: 13.8 % — ABNORMAL HIGH (ref 3.0–12.0)
Neutrophils Relative %: 55.5 % (ref 43.0–77.0)
Platelets: 245 10*3/uL (ref 150.0–400.0)
WBC: 4 10*3/uL — ABNORMAL LOW (ref 4.5–10.5)

## 2012-11-27 LAB — HEPATIC FUNCTION PANEL
ALT: 17 U/L (ref 0–35)
AST: 29 U/L (ref 0–37)
Alkaline Phosphatase: 37 U/L — ABNORMAL LOW (ref 39–117)
Bilirubin, Direct: 0 mg/dL (ref 0.0–0.3)
Total Bilirubin: 0.5 mg/dL (ref 0.3–1.2)

## 2012-11-27 LAB — BASIC METABOLIC PANEL
BUN: 13 mg/dL (ref 6–23)
GFR: 93.54 mL/min (ref >60.00)
Potassium: 3.8 mEq/L (ref 3.5–5.1)

## 2012-11-27 NOTE — Assessment & Plan Note (Signed)
Probably MS ekg normal Check ddimer

## 2012-11-27 NOTE — Patient Instructions (Addendum)
Deep Vein Thrombosis A deep vein thrombosis (DVT) is a blood clot that develops in a deep vein. A DVT is a clot in the deep, larger veins of the leg, arm, or pelvis. These are more dangerous than clots that might form in veins near the surface of the body. A DVT can lead to complications if the clot breaks off and travels in the bloodstream to the lungs.  A DVT can damage the valves in your leg veins, so that instead of flowing upwards, the blood pools in the lower leg. This is called post-thrombotic syndrome, and can result in pain, swelling, discoloration, and sores on the leg. Once identified, a DVT can be treated. It can also be prevented in some circumstances. Once you have had a DVT, you may be at increased risk for a DVT in the future. CAUSES Blood clots form in a vein for different reasons. Usually several things contribute to blood clots. Contributing factors include:  The flow of blood slows down.  The inside of the vein is damaged in some way.  The person has a condition that makes blood clot more easily. Some people are more likely than others to develop blood clots. That is because they have more factors that make clots likely. These are called risk factors. Risk factors include:   Older age, especially over 75 years old.  Having a history of blood clots. This means you have had one before. Or, it means that someone else in your family has had blood clots. You may have a genetic tendency to form clots.  Having major or lengthy surgery. This is especially true for surgery on the hip, knee, or belly (abdomen). Hip surgery is particularly high risk.  Breaking a hip or leg.  Sitting or lying still for a long time. This includes long distance travel, paralysis, or recovery from an illness or surgery.  Cancer, or cancer treatment.  Having a long, thin tube (catheter) placed inside a vein during a medical procedure.  Being overweight (obese).  Pregnancy and childbirth. Hormone  changes make the blood clot more easily during pregnancy. The fetus puts pressure on the veins of the pelvis. There is also risk of injury to veins during delivery or a caesarean. The risk is at its highest just after childbirth.  Medicines with the female hormone estrogen. This includes birth control pills and hormone replacement therapy.  Smoking.  Other circulation or heart problems. SYMPTOMS When a clot forms, it can either partially or totally block the blood flow in that vein. Symptoms of a DVT can include:  Swelling of the leg or arm, especially if one side is much worse.  Warmth and redness of the leg or arm, especially if one side is much worse.  Pain in an arm or leg. If the clot is in the leg, symptoms may be more noticeable or worse when standing or walking. The symptoms of a DVT that has traveled to the lungs (pulmonary embolism, PE) usually start suddenly, and include:  Shortness of breath.  Coughing.  Coughing up blood or blood-tinged phlegm.  Chest pain. The chest pain is often worse with deep breaths.  Rapid heartbeat. Anyone with these symptoms should get emergency medical treatment right away. Call your local emergency services (911 in [place] if you have these symptoms. DIAGNOSIS If a DVT is suspected, your caregiver will take a full medical history and carry out a physical exam. Tests that also may be required include:  Blood tests, including studies of   the clotting properties of the blood.  Ultrasonography to see if you have clots in your legs or lungs.  X-rays to show the flow of blood when dye is injected into the veins (venography).  Studies of your lungs, if you have any chest symptoms. PREVENTION  Exercise the legs regularly. Take a brisk 30 minute walk every day.  Maintain a weight that is appropriate for your height.  Avoid sitting or lying in bed for long periods of time without moving your legs.  Women, particularly those over the age of 35,  should consider the risks and benefits of taking estrogen medicines, including birth control pills.  Do not smoke, especially if you take estrogen medicines.  Long distance travel can increase your risk of DVT. You should exercise your legs by walking or pumping the muscles every hour.  In-hospital prevention:  Many of the risk factors above relate to situations that exist with hospitalization, either for illness, injury, or elective surgery.  Your caregiver will assess you for the need for venous thromboembolism prophylaxis when you are admitted to the hospital. If you are having surgery, your surgeon will assess you the day of or day after surgery.  Prevention may include medical and nonmedical measures. TREATMENT Treatment for DVT helps prevent death and disability. The most common treatment for DVT is blood thinning (anticoagulant) medicine, which reduces the blood's tendency to clot. Anticoagulants can stop new blood clots from forming and old ones from growing. They cannot dissolve existing clots. Your body does this by itself over time. Anticoagulants can be given by mouth, by intravenous (IV) access, or by injection. Your caregiver will determine the best program for you.  Heparin or related medicines (low molecular weight heparin) are usually the first treatment for a blood clot. They act quickly. However, they cannot be taken orally.  Heparin can cause a fall in a component of blood that stops bleeding and forms blood clots (platelets). You will be monitored with blood tests to be sure this does not occur.  Warfarin is an anticoagulant that can be swallowed (taken orally). It takes a few days to start working, so usually heparin or related medicines are used in combination. Once warfarin is working, heparin is usually stopped.  Less commonly, clot dissolving drugs (thrombolytics) are used to dissolve a DVT. They carry a high risk of bleeding, so they are used mainly in severe cases,  where a life or limb is threatened.  Very rarely, a blood clot in the leg needs to be removed surgically.  If you are unable to take anticoagulants, your caregiver may arrange for you to have a filter placed in a main vein in your belly (abdomen). This filter prevents clots from traveling to your lungs. HOME CARE INSTRUCTIONS  Take all medicines prescribed by your caregiver. Follow the directions carefully.  Warfarin. Most people will continue taking warfarin after hospital discharge. Your caregiver will advise you on the length of treatment (usually 3 6 months, sometimes lifelong).  Too much and too little warfarin are both dangerous. Too much warfarin increases the risk of bleeding. Too little warfarin continues to allow the risk for blood clots. While taking warfarin, you will need to have regular blood tests to measure your blood clotting time. These blood tests usually include both the prothrombin time (PT) and international normalized ratio (INR) tests. The PT and INR results allow your caregiver to adjust your dose of warfarin. The dose can change for many reasons. It is critically important that   you take warfarin exactly as prescribed, and that you have your PT and INR levels drawn exactly as directed.  Many foods, especially foods high in vitamin K can interfere with warfarin and affect the PT and INR results. Foods high in vitamin K include spinach, kale, broccoli, cabbage, collard and turnip greens, brussels sprouts, peas, cauliflower, seaweed, and parsley as well as beef and pork liver, green tea, and soybean oil. You should eat a consistent amount of foods high in vitamin K. Avoid major changes in your diet, or notify your caregiver before changing your diet. Arrange a visit with a dietitian to answer your questions.  Many medicines can interfere with warfarin and affect the PT and INR results. You must tell your caregiver about any and all medicines you take, this includes all vitamins  and supplements. Be especially cautious with aspirin and anti-inflammatory medicines. Ask your caregiver before taking these. Do not take or discontinue any prescribed or over-the-counter medicine except on the advice of your caregiver or pharmacist.  Warfarin can have side effects, primarily excessive bruising or bleeding. You will need to hold pressure over cuts for longer than usual. Your caregiver or pharmacist will discuss other potential side effects.  Alcohol can change the body's ability to handle warfarin. It is best to avoid alcoholic drinks or consume only very small amounts while taking warfarin. Notify your caregiver if you change your alcohol intake.  Notify your dentist or other caregivers before procedures.  Activity. Ask your caregiver how soon you can go back to normal activities. It is important to stay active to prevent blood clots. If you are on anticoagulant medicine, avoid contact sports.  Exercise. It is very important to exercise. This is especially important while traveling, sitting or standing for long periods of time. Exercise your legs by walking or by pumping the muscles frequently. Take frequent walks.  Compression stockings. These are tight elastic stockings that apply pressure to the lower legs. This pressure can help keep the blood in the legs from clotting. You may need to wear compressions stockings at home to help prevent a DVT.  Smoking. If you smoke, quit. Ask your caregiver for help with quitting smoking.  Learn as much as you can about DVT. Knowing more about the condition should help you keep it from coming back.  Wear a medical alert bracelet or carry a medical alert card. SEEK MEDICAL CARE IF:  You notice a rapid heartbeat.  You feel weaker or more tired than usual.  You feel faint.  You notice increased bruising.  You feel your symptoms are not getting better in the time expected.  You believe you are having side effects of medicine. SEEK  IMMEDIATE MEDICAL CARE IF:  You have chest pain.  You have trouble breathing.  You have new or increased swelling or pain in one leg.  You cough up blood.  You notice blood in vomit, in a bowel movement, or in urine. MAKE SURE YOU:  Understand these instructions.  Will watch your condition.  Will get help right away if you are not doing well or get worse. Document Released: 06/24/2005 Document Revised: 03/18/2012 Document Reviewed: 08/16/2010 [employer] Patient Information 2014 [employer].  

## 2012-11-27 NOTE — Progress Notes (Signed)
  Subjective:    Patient ID: Michele Turner, female    DOB: Apr 28, 1976, 37 y.o.   MRN: [id number]  HPI Pt here c/o pain in L calf and tightness that feels like it did when she had her clot.  She also has a "funny feeling" in her chest and is not sure if it is anxiety over all this or not.  No palpitations.  Inr was 2.9 Tuesday.   Review of Systems As above    Objective:   Physical Exam  BP 128/84  Pulse 77  Temp(Src) 98.7 F (37.1 C) (Oral)  Wt 121 lb 6.4 oz (55.067 kg)  BMI 20.83 kg/m2  SpO2 98% General appearance: alert, cooperative, appears stated age and no distress Lungs: clear to auscultation bilaterally Heart: S1, S2 normal Extremities: + L calf slightly errythematous and feels tight to pt but no pain with palpation       Assessment & Plan:

## 2012-11-27 NOTE — Assessment & Plan Note (Signed)
Therapeutic on coumadin but calf pain again and errythema Check labs Recheck doppler If chest pain reoccurs , go to ER

## 2012-12-02 ENCOUNTER — Other Ambulatory Visit: Payer: Self-pay

## 2012-12-02 DIAGNOSIS — F988 Other specified behavioral and emotional disorders with onset usually occurring in childhood and adolescence: Secondary | ICD-10-CM

## 2012-12-02 MED ORDER — LISDEXAMFETAMINE DIMESYLATE 70 MG PO CAPS: 70.0000 mg | ORAL_CAPSULE | ORAL | Status: DC

## 2012-12-02 NOTE — Telephone Encounter (Signed)
Patient lost her pills. Ok to refill for 3 mos per Dr.Lowne.       KP

## 2012-12-03 ENCOUNTER — Ambulatory Visit (INDEPENDENT_AMBULATORY_CARE_PROVIDER_SITE_OTHER): Payer: Managed Care, Other (non HMO) | Admitting: Internal Medicine

## 2012-12-03 DIAGNOSIS — I82409 Acute embolism and thrombosis of unspecified deep veins of unspecified lower extremity: Secondary | ICD-10-CM

## 2012-12-03 DIAGNOSIS — D6859 Other primary thrombophilia: Secondary | ICD-10-CM

## 2012-12-03 LAB — POCT INR: INR: 3.4

## 2012-12-10 ENCOUNTER — Encounter: Payer: Self-pay | Admitting: Family Medicine

## 2012-12-24 ENCOUNTER — Ambulatory Visit (INDEPENDENT_AMBULATORY_CARE_PROVIDER_SITE_OTHER): Payer: Managed Care, Other (non HMO) | Admitting: Cardiology

## 2012-12-24 DIAGNOSIS — I82409 Acute embolism and thrombosis of unspecified deep veins of unspecified lower extremity: Secondary | ICD-10-CM

## 2012-12-24 DIAGNOSIS — D6859 Other primary thrombophilia: Secondary | ICD-10-CM

## 2012-12-24 LAB — POCT INR: INR: 2.6

## 2012-12-29 ENCOUNTER — Other Ambulatory Visit: Payer: Self-pay | Admitting: *Deleted

## 2012-12-29 MED ORDER — WARFARIN SODIUM 5 MG PO TABS: ORAL_TABLET | ORAL | Status: DC

## 2013-01-11 ENCOUNTER — Other Ambulatory Visit: Payer: Self-pay | Admitting: *Deleted

## 2013-01-11 ENCOUNTER — Other Ambulatory Visit: Payer: Self-pay | Admitting: Family Medicine

## 2013-01-11 DIAGNOSIS — B009 Herpesviral infection, unspecified: Secondary | ICD-10-CM

## 2013-01-11 MED ORDER — VALACYCLOVIR HCL 1 G PO TABS: ORAL_TABLET | ORAL | Status: DC

## 2013-01-11 NOTE — Telephone Encounter (Signed)
Refill for Valtrex sent to [facility]

## 2013-01-13 ENCOUNTER — Ambulatory Visit (INDEPENDENT_AMBULATORY_CARE_PROVIDER_SITE_OTHER): Payer: Managed Care, Other (non HMO) | Admitting: Cardiology

## 2013-01-13 DIAGNOSIS — D6859 Other primary thrombophilia: Secondary | ICD-10-CM

## 2013-01-13 DIAGNOSIS — I82409 Acute embolism and thrombosis of unspecified deep veins of unspecified lower extremity: Secondary | ICD-10-CM

## 2013-01-20 ENCOUNTER — Ambulatory Visit (INDEPENDENT_AMBULATORY_CARE_PROVIDER_SITE_OTHER): Payer: Managed Care, Other (non HMO) | Admitting: Internal Medicine

## 2013-01-20 DIAGNOSIS — I82409 Acute embolism and thrombosis of unspecified deep veins of unspecified lower extremity: Secondary | ICD-10-CM

## 2013-01-20 DIAGNOSIS — D6859 Other primary thrombophilia: Secondary | ICD-10-CM

## 2013-01-20 LAB — POCT INR: INR: 2.7

## 2013-02-03 LAB — POCT INR: INR: 2.5

## 2013-02-04 ENCOUNTER — Ambulatory Visit (INDEPENDENT_AMBULATORY_CARE_PROVIDER_SITE_OTHER): Payer: Managed Care, Other (non HMO) | Admitting: Cardiology

## 2013-02-04 DIAGNOSIS — D6859 Other primary thrombophilia: Secondary | ICD-10-CM

## 2013-02-04 DIAGNOSIS — I82409 Acute embolism and thrombosis of unspecified deep veins of unspecified lower extremity: Secondary | ICD-10-CM

## 2013-02-19 ENCOUNTER — Ambulatory Visit (INDEPENDENT_AMBULATORY_CARE_PROVIDER_SITE_OTHER): Payer: Managed Care, Other (non HMO) | Admitting: Internal Medicine

## 2013-02-19 DIAGNOSIS — I82409 Acute embolism and thrombosis of unspecified deep veins of unspecified lower extremity: Secondary | ICD-10-CM

## 2013-02-19 DIAGNOSIS — D6859 Other primary thrombophilia: Secondary | ICD-10-CM

## 2013-02-22 ENCOUNTER — Telehealth: Payer: Self-pay | Admitting: *Deleted

## 2013-02-22 NOTE — Telephone Encounter (Signed)
Message copied by Carmela Hurt on Mon Feb 22, 2013 10:30 AM ------      Message from: Cassell Clement      Created: Fri Feb 19, 2013  5:25 PM      Regarding: RE: trip overseas       She should be fine on coumadin and with compression hose..      TB      ----- Message -----         From: Carmela Hurt, RN         Sent: 02/19/2013   8:38 AM           To: Burnell Blanks, Cassell Clement, MD      Subject: trip overseas                                            Dr Patty Sermons, patient wanted you to know she is going to be [place] a flight [place] 10[place]10[place]17, she is going to take her coag monitor and wear compression hose on flight, any other suggestions just let me know. Thanks, Addison Lank, RN       ------

## 2013-02-26 ENCOUNTER — Ambulatory Visit (INDEPENDENT_AMBULATORY_CARE_PROVIDER_SITE_OTHER): Payer: Managed Care, Other (non HMO) | Admitting: Family Medicine

## 2013-02-26 VITALS — BP 118/72 | HR 74

## 2013-02-26 DIAGNOSIS — M542 Cervicalgia: Secondary | ICD-10-CM | POA: Insufficient documentation

## 2013-02-26 DIAGNOSIS — IMO0002 Reserved for concepts with insufficient information to code with codable children: Secondary | ICD-10-CM

## 2013-02-26 DIAGNOSIS — M7050 Other bursitis of knee, unspecified knee: Secondary | ICD-10-CM | POA: Insufficient documentation

## 2013-02-26 DIAGNOSIS — M9981 Other biomechanical lesions of cervical region: Secondary | ICD-10-CM

## 2013-02-26 DIAGNOSIS — M999 Biomechanical lesion, unspecified: Secondary | ICD-10-CM

## 2013-02-26 DIAGNOSIS — M25562 Pain in left knee: Secondary | ICD-10-CM

## 2013-02-26 DIAGNOSIS — M25569 Pain in unspecified knee: Secondary | ICD-10-CM

## 2013-02-26 NOTE — Patient Instructions (Signed)
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  If not better will do injection Pes anserine bursitis.  celebrex 5 days.   Do exercise for your back, focus on upper back  Ice knee 20 minutes daily If manipulation helped come back again in 3-4 weeks.

## 2013-02-26 NOTE — Assessment & Plan Note (Signed)
After verbal consent patient was treated with HVLA, ME techniques in cervical, thoracic and lumbar areas  Patient tolerated the procedure well with improvement in symptoms  Patient given exercises, stretches and lifestyle modifications  See medications in patient instructions if given  Patient will follow up in 3-4weeks

## 2013-02-26 NOTE — Assessment & Plan Note (Signed)
After verbal consent patient was treated with HVLA, ME techniques in cervical, thoracic and lumbar areas  Patient tolerated the procedure well with improvement in symptoms  Patient given exercises, stretches and lifestyle modifications  See medications in patient instructions if given  Patient will follow up in 3-4weeks 

## 2013-02-26 NOTE — Assessment & Plan Note (Signed)
Decision today to treat with OMT was based on Physical Exam  After verbal consent patient was treated with HVLA, ME techniques in cervical, thoracic and lumbar areas  Patient tolerated the procedure well with improvement in symptoms  Patient given exercises, stretches and lifestyle modifications  See medications in patient instructions if given  Patient will follow up in 3-4 weeks    

## 2013-02-26 NOTE — Assessment & Plan Note (Signed)
Patient responded very well to osteopathic manipulation. Patient was encouraged to do more upper back exercises and was given handout today. Discussed red flags and went to seek medical attention Patient will return again in 3-4 weeks for further evaluation and treatment.
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
## 2013-02-26 NOTE — Assessment & Plan Note (Signed)
The patient exercises that were consistent with both problems. Patient's plica or pes anserine seems to be worse at followup in 3 weeks can do injection. Meniscus looks perfectly normal on ultrasound today. Patient was given a brace and showed proper placement. Return in 3-4 weeks

## 2013-02-26 NOTE — Assessment & Plan Note (Signed)
Patient did have pes anserine bursitis. Patient will try conservative therapy at this time. Patient will do a very short course of Celebrex that she has at home. Warned her of potential side effects with her antithrombotic. In addition this patient was given a home exercise program and handout. As well as encouraged icing. If no improvement in 3 weeks we will do injection

## 2013-03-01 ENCOUNTER — Other Ambulatory Visit: Payer: Self-pay | Admitting: Family Medicine

## 2013-03-01 DIAGNOSIS — F988 Other specified behavioral and emotional disorders with onset usually occurring in childhood and adolescence: Secondary | ICD-10-CM

## 2013-03-01 MED ORDER — LISDEXAMFETAMINE DIMESYLATE 70 MG PO CAPS: 70.0000 mg | ORAL_CAPSULE | ORAL | Status: DC

## 2013-03-04 ENCOUNTER — Telehealth: Payer: Self-pay | Admitting: Family Medicine

## 2013-03-04 NOTE — Telephone Encounter (Signed)
Patient says that [facility] faxed over a prior authorization request form for her Vyvanse Rx medication. Her insurance plan has changed and they now require this in order for her rx to be refilled. Patient says that she is completely out and she was told that the prior auth had to be complete by 11:30am in order for her medication to be refilled today. Our office needs to call ph: [phone] to authorize.

## 2013-03-04 NOTE — Telephone Encounter (Signed)
I received the fax about 20 min ago have call patient insurance and her medication was approved (vyvanse 70mg )  Patient will be notified.  Document will be faxed into epic.  Ag cma

## 2013-03-05 ENCOUNTER — Ambulatory Visit (INDEPENDENT_AMBULATORY_CARE_PROVIDER_SITE_OTHER): Payer: Managed Care, Other (non HMO) | Admitting: Family Medicine

## 2013-03-05 ENCOUNTER — Ambulatory Visit (INDEPENDENT_AMBULATORY_CARE_PROVIDER_SITE_OTHER): Payer: Managed Care, Other (non HMO) | Admitting: Pharmacist

## 2013-03-05 ENCOUNTER — Encounter: Payer: Self-pay | Admitting: Family Medicine

## 2013-03-05 VITALS — BP 110/80 | HR 62

## 2013-03-05 DIAGNOSIS — S4980XA Other specified injuries of shoulder and upper arm, unspecified arm, initial encounter: Secondary | ICD-10-CM

## 2013-03-05 DIAGNOSIS — M9981 Other biomechanical lesions of cervical region: Secondary | ICD-10-CM

## 2013-03-05 DIAGNOSIS — I82409 Acute embolism and thrombosis of unspecified deep veins of unspecified lower extremity: Secondary | ICD-10-CM

## 2013-03-05 DIAGNOSIS — S46909A Unspecified injury of unspecified muscle, fascia and tendon at shoulder and upper arm level, unspecified arm, initial encounter: Secondary | ICD-10-CM

## 2013-03-05 DIAGNOSIS — S46001A Unspecified injury of muscle(s) and tendon(s) of the rotator cuff of right shoulder, initial encounter: Secondary | ICD-10-CM

## 2013-03-05 DIAGNOSIS — S46009A Unspecified injury of muscle(s) and tendon(s) of the rotator cuff of unspecified shoulder, initial encounter: Secondary | ICD-10-CM | POA: Insufficient documentation

## 2013-03-05 DIAGNOSIS — M542 Cervicalgia: Secondary | ICD-10-CM

## 2013-03-05 DIAGNOSIS — D6859 Other primary thrombophilia: Secondary | ICD-10-CM

## 2013-03-05 DIAGNOSIS — M999 Biomechanical lesion, unspecified: Secondary | ICD-10-CM

## 2013-03-05 NOTE — Assessment & Plan Note (Signed)
Decision today to treat with OMT was based on Physical Exam  After verbal consent patient was treated with HVLa and ME techniques in cervical areas  Patient tolerated the procedure well with improvement in symptoms  Patient given exercises, stretches and lifestyle modifications  See medications in patient instructions if given  Patient will follow up in 2 weeks

## 2013-03-05 NOTE — Progress Notes (Signed)
Subjective:    CC: Neck pain  HPI: Patient is a 37 year old established patient coming in with acute onset of neck pain. Patient was found to have nonoperative occlusion of the cervical region at last visit. Patient did respond very well initially but now is having more pain.  Patient recently did some new exercising at the gym that seem to exacerbate the problem. Patient states that she is having more neck pain it seems to be radiating down the right side of her arm. Patient states it is only happen when she reaches across her body or trying to reach above her head. Patient denies any weakness or numbness. Patient found it very difficult to sleep. Patient has not tried any anti-inflammatories ice or any other modalities at this time. Patient is a severity of 7/10. Pain has only been going on now for approximately 2 days.  Past medical history, Surgical history, Family history not pertinant except as noted below, Social history, Allergies, and medications have been entered into the medical record, reviewed, and no changes needed.   Review of Systems: No fevers, chills, night sweats, weight loss, chest pain, or shortness of breath.   Objective:   Blood pressure 110/80, pulse 62, SpO2 98.00%.  General: Well Developed, well nourished, and in no acute distress.  Neuro: Alert and oriented x3, extra-ocular muscles intact, sensation grossly intact.  HEENT: Normocephalic, atraumatic, pupils equal round reactive to light, neck supple, no masses, no lymphadenopathy, thyroid nonpalpable.  Skin: Warm and dry, no rashes. Cardiac:  no lower extremity edema. Respiratory: Not using accessory muscles, speaking in full sentences. Abdominal: NT, soft Gait: Nonantlagic, good balance and coordination Lymphatic: no lymphadenopathy in neck or axillae on palpation, non tender.  Musculoskeletal: IInspection and palpation of the right and left lower extremities including the hips knees and ankles are unremarkable and  nontender with full range of motion and good muscle strength and tone and are symmetric. Neck: Inspection unremarkable. No palpable stepoffs. Negative Spurling's maneuver. Full neck range of motion Grip strength and sensation normal in bilateral hands Strength good C4 to T1 distribution No sensory change to C4 to T1 Negative Hoffman sign bilaterally Reflexes normal Patient is tender to palpation of the right paraspinal musculature. Shoulder: Inspection reveals no abnormalities, atrophy or asymmetry. Mild tenderness to palpation over the supraspinatus origin  ROM is full in all planes. Rotator cuff strength normal throughout. Positive Neer and Hawkin's tests, empty can sign. Speeds and Yergason's tests normal. No labral pathology noted with negative Obrien's, negative clunk and good stability. Normal scapular function observed. No painful arc and no drop arm sign. No apprehension sign Contralateral side is unremarkable  OMT Physical Exam  Standing structural  Cervical  C2 F RS right C 7 F RS right  Thoracic  T3-7 E R right, S left    MSK US performed of: Right shoulder This study was ordered, performed, and interpreted by Terrilee Files D.O.  Shoulder:   Supraspinatus:  A mild bursa bulge noted. There is hypoechoic changes but no true tear appreciated. No neovascularization. Infraspinatus:  Appears normal on long and transverse views. Subscapularis:  Patient does have a small tear at the insertion of the subscapularis with hypoechoic changes. There is neovascularization occurring in this small tear. Approximately 10% of the tendon is torn.. Teres Minor:  Appears normal on long and transverse views. AC joint:  Capsule undistended, no geyser sign. Glenohumeral Joint:  Appears normal without effusion. Glenoid Labrum:  Intact without visualized tears. Biceps Tendon:  Appears  normal on long and transverse views, no fraying of tendon, tendon located in intertubercular groove, no  subluxation with shoulder internal or external rotation does have Bullseye sign secondary to intra-articular swelling.    Impression and Recommendations:

## 2013-03-05 NOTE — Assessment & Plan Note (Signed)
Continue muscle imbalances. Some radiation secondary to rotator cuff injury. Home exercise given again and discussed proper technique  Responded well to osteopathic manipulation. Return again in one to 2 weeks for further evaluation.

## 2013-03-05 NOTE — Assessment & Plan Note (Signed)
Rotator Cuff Tendinopathy  Shoulder anatomy was reviewed with the patient using and anatomical model.   Rotator cuff strengthening and scapular stabilization exercises were reviewed with the patient. Rockwell and scapular stabilization program given to the patient.  Retraining shoulder mechanics and function was emphasized to the patient with rehab done at least 5-6 days a week.  The patient could benefit from formal PT to assist with scapular stabilization and RTC strengthening but will try home first  Anti-inflammtories OTC for next 3 days scheduled then PRN  RTC in 2 weeks if still painuful consider injection.

## 2013-03-05 NOTE — Patient Instructions (Signed)
Try anti inflammatories Do exercises as stated No overhead activity and avoid any movement with hand out of peripheral vision.  Come back in 2 weeks

## 2013-03-24 ENCOUNTER — Encounter: Payer: Self-pay | Admitting: Lab

## 2013-03-25 ENCOUNTER — Encounter: Payer: Self-pay | Admitting: Family Medicine

## 2013-03-25 ENCOUNTER — Ambulatory Visit (INDEPENDENT_AMBULATORY_CARE_PROVIDER_SITE_OTHER): Payer: Managed Care, Other (non HMO) | Admitting: Family Medicine

## 2013-03-25 VITALS — BP 108/68 | HR 77 | Temp 97.8°F | Resp 12 | Ht 64.0 in | Wt 121.0 lb

## 2013-03-25 DIAGNOSIS — F988 Other specified behavioral and emotional disorders with onset usually occurring in childhood and adolescence: Secondary | ICD-10-CM

## 2013-03-25 DIAGNOSIS — Z789 Other specified health status: Secondary | ICD-10-CM

## 2013-03-25 DIAGNOSIS — F909 Attention-deficit hyperactivity disorder, unspecified type: Secondary | ICD-10-CM

## 2013-03-25 MED ORDER — METRONIDAZOLE 500 MG PO TABS: ORAL_TABLET | ORAL | Status: DC

## 2013-03-25 MED ORDER — LISDEXAMFETAMINE DIMESYLATE 70 MG PO CAPS: 70.0000 mg | ORAL_CAPSULE | ORAL | Status: DC

## 2013-03-25 MED ORDER — CIPROFLOXACIN HCL 500 MG PO TABS: 500.0000 mg | ORAL_TABLET | Freq: Two times a day (BID) | ORAL | Status: AC

## 2013-03-25 MED ORDER — DIAZEPAM 5 MG PO TABS: 5.0000 mg | ORAL_TABLET | Freq: Two times a day (BID) | ORAL | Status: DC | PRN

## 2013-03-25 NOTE — Patient Instructions (Addendum)
Attention Deficit Hyperactivity Disorder Attention deficit hyperactivity disorder (ADHD) is a problem with behavior issues based on the way the brain functions (neurobehavioral disorder). It is a common reason for behavior and academic problems in school. CAUSES  The cause of ADHD is unknown in most cases. It may run in families. It sometimes can be associated with learning disabilities and other behavioral problems. SYMPTOMS  There are 3 types of ADHD. The 3 types and some of the symptoms include:  Inattentive  Gets bored or distracted easily.  Loses or forgets things. Forgets to hand in homework.  Has trouble organizing or completing tasks.  Difficulty staying on task.  An inability to organize daily tasks and school work.  Leaving projects, chores, or homework unfinished.  Trouble paying attention or responding to details. Careless mistakes.  Difficulty following directions. Often seems like is not listening.  Dislikes activities that require sustained attention (like chores or homework).  Hyperactive-impulsive  Feels like it is impossible to sit still or stay in a seat. Fidgeting with hands and feet.  Trouble waiting turn.  Talking too much or out of turn. Interruptive.  Speaks or acts impulsively.  Aggressive, disruptive behavior.  Constantly busy or on the go, noisy.  Combined  Has symptoms of both of the above. Often children with ADHD feel discouraged about themselves and with school. They often perform well below their abilities in school. These symptoms can cause problems in home, school, and in relationships with peers. As children get older, the excess motor activities can calm down, but the problems with paying attention and staying organized persist. Most children do not outgrow ADHD but with good treatment can learn to cope with the symptoms. DIAGNOSIS  When ADHD is suspected, the diagnosis should be made by professionals trained in ADHD.  Diagnosis will  include:  Ruling out other reasons for the child's behavior.  The caregivers will check with the child's school and check their medical records.  They will talk to teachers and parents.  Behavior rating scales for the child will be filled out by those dealing with the child on a daily basis. A diagnosis is made only after all information has been considered. TREATMENT  Treatment usually includes behavioral treatment often along with medicines. It may include stimulant medicines. The stimulant medicines decrease impulsivity and hyperactivity and increase attention. Other medicines used include antidepressants and certain blood pressure medicines. Most experts agree that treatment for ADHD should address all aspects of the child's functioning. Treatment should not be limited to the use of medicines alone. Treatment should include structured classroom management. The parents must receive education to address rewarding good behavior, discipline, and limit-setting. Tutoring or behavioral therapy or both should be available for the child. If untreated, the disorder can have long-term serious effects into adolescence and adulthood. HOME CARE INSTRUCTIONS   Often with ADHD there is a lot of frustration among the family in dealing with the illness. There is often blame and anger that is not warranted. This is a life long illness. There is no way to prevent ADHD. In many cases, because the problem affects the family as a whole, the entire family may need help. A therapist can help the family find better ways to handle the disruptive behaviors and promote change. If the child is young, most of the therapist's work is with the parents. Parents will learn techniques for coping with and improving their child's behavior. Sometimes only the child with the ADHD needs counseling. Your caregivers can help   you make these decisions.  Children with ADHD may need help in organizing. Some helpful tips include:  Keep  routines the same every day from wake-up time to bedtime. Schedule everything. This includes homework and playtime. This should include outdoor and indoor recreation. Keep the schedule on the refrigerator or a bulletin board where it is frequently seen. Mark schedule changes as far in advance as possible.  Have a place for everything and keep everything in its place. This includes clothing, backpacks, and school supplies.  Encourage writing down assignments and bringing home needed books.  Offer your child a well-balanced diet. Breakfast is especially important for school performance. Children should avoid drinks with caffeine including:  Soft drinks.  Coffee.  Tea.  However, some older children (adolescents) may find these drinks helpful in improving their attention.  Children with ADHD need consistent rules that they can understand and follow. If rules are followed, give small rewards. Children with ADHD often receive, and expect, criticism. Look for good behavior and praise it. Set realistic goals. Give clear instructions. Look for activities that can foster success and self-esteem. Make time for pleasant activities with your child. Give lots of affection.  Parents are their children's greatest advocates. Learn as much as possible about ADHD. This helps you become a stronger and better advocate for your child. It also helps you educate your child's teachers and instructors if they feel inadequate in these areas. Parent support groups are often helpful. A national group with local chapters is called CHADD (Children and Adults with Attention Deficit Hyperactivity Disorder). PROGNOSIS  There is no cure for ADHD. Children with the disorder seldom outgrow it. Many find adaptive ways to accommodate the ADHD as they mature. SEEK MEDICAL CARE IF:  Your child has repeated muscle twitches, cough or speech outbursts.  Your child has sleep problems.  Your child has a marked loss of  appetite.  Your child develops depression.  Your child has new or worsening behavioral problems.  Your child develops dizziness.  Your child has a racing heart.  Your child has stomach pains.  Your child develops headaches. Document Released: 06/14/2002 Document Revised: 09/16/2011 Document Reviewed: 01/25/2008 [employer] Patient Information 2014 [employer].  

## 2013-03-25 NOTE — Assessment & Plan Note (Signed)
con't meds rto 6 months 

## 2013-03-25 NOTE — Assessment & Plan Note (Signed)
See meds and orders 

## 2013-03-25 NOTE — Progress Notes (Signed)
  Subjective:    Patient ID: Michele Turner, female    DOB: Nov 05, 1975, 37 y.o.   MRN: [id number]  HPI Pt here f/u add.  No complaints.  She is traveling on a cruise in med. And would like meds to take with her.    Review of Systems    as above Objective:   Physical Exam  BP 108/68  Pulse 77  Temp(Src) 97.8 F (36.6 C) (Oral)  Resp 12  Ht 5\' 4"  (1.626 m)  Wt 121 lb (54.885 kg)  BMI 20.76 kg/m2  SpO2 98% General appearance: alert, cooperative, appears stated age and no distress Lungs: clear to auscultation bilaterally Heart: S1, S2 normal       Assessment & Plan:

## 2013-03-26 ENCOUNTER — Ambulatory Visit (INDEPENDENT_AMBULATORY_CARE_PROVIDER_SITE_OTHER): Payer: Managed Care, Other (non HMO) | Admitting: Family Medicine

## 2013-03-26 ENCOUNTER — Encounter: Payer: Self-pay | Admitting: Family Medicine

## 2013-03-26 VITALS — BP 124/80 | HR 61

## 2013-03-26 DIAGNOSIS — M999 Biomechanical lesion, unspecified: Secondary | ICD-10-CM

## 2013-03-26 DIAGNOSIS — Z5189 Encounter for other specified aftercare: Secondary | ICD-10-CM

## 2013-03-26 DIAGNOSIS — M542 Cervicalgia: Secondary | ICD-10-CM

## 2013-03-26 DIAGNOSIS — S46001D Unspecified injury of muscle(s) and tendon(s) of the rotator cuff of right shoulder, subsequent encounter: Secondary | ICD-10-CM

## 2013-03-26 DIAGNOSIS — M9981 Other biomechanical lesions of cervical region: Secondary | ICD-10-CM

## 2013-03-26 MED ORDER — IBUPROFEN 800 MG PO TABS: 800.0000 mg | ORAL_TABLET | Freq: Three times a day (TID) | ORAL | Status: DC | PRN

## 2013-03-26 MED ORDER — FAMOTIDINE 20 MG PO TABS: 20.0000 mg | ORAL_TABLET | Freq: Two times a day (BID) | ORAL | Status: DC

## 2013-03-26 NOTE — Progress Notes (Signed)
  Subjective:    CC: , Neck and shoulder pain  HPI: Patient is a very pleasant 37 year old female following up for an ostial skeletal neck complaints as well as rotator cuff tendinopathy of the right shoulder. Regarding the patient's shoulder she was given home exercise, over-the-counter anti-inflammatories in with see if she would improve. Patient states she has made some mild improvement. Patient does notice that she has pain though when she reaches back into the back seat of her car. Re: patient's neck she has been seen for osteopathic manipulation previously and had good results.  Past medical history, Surgical history, Family history not pertinant except as noted below, Social history, Allergies, and medications have been entered into the medical record, reviewed, and no changes needed.   Review of Systems: No fevers, chills, night sweats, weight loss, chest pain, or shortness of breath.   Objective:   There were no vitals taken for this visit.  General: Well Developed, well nourished, and in no acute distress.  Neuro: Alert and oriented x3, extra-ocular muscles intact, sensation grossly intact.  HEENT: Normocephalic, atraumatic, pupils equal round reactive to light, neck supple, no masses, no lymphadenopathy, thyroid nonpalpable.  Skin: Warm and dry, no rashes. Cardiac:  no lower extremity edema. Respiratory: Not using accessory muscles, speaking in full sentences. Abdominal: NT, soft Gait: Nonantlagic, good balance and coordination Lymphatic: no lymphadenopathy in neck or axillae on palpation, non tender.  Musculoskeletal: Inspection and palpation of the right and left upper extremities including the shoulders elbows and wrist are unremarkable with full range of motion and good muscle strength and tone. Inspection and palpation of the right and left lower extremities including the hips knees and ankles are unremarkable and nontender with full range of motion and good muscle strength  and tone and are symmetric. Neck: Inspection unremarkable. No palpable stepoffs. Negative Spurling's maneuver. Full neck range of motion Grip strength and sensation normal in bilateral hands Strength good C4 to T1 distribution No sensory change to C4 to T1 Negative Hoffman sign bilaterally Reflexes normal Patient is tender to palpation of the right paraspinal musculature.  Shoulder:  Inspection reveals no abnormalities, atrophy or asymmetry.  Mild tenderness to palpation over the supraspinatus origin but improved from previous visit.  ROM is full in all planes.  Rotator cuff strength normal throughout.  Positive Neer and Hawkin's tests, empty can sign.  Speeds and Yergason's tests normal.  No labral pathology noted with negative Obrien's, negative clunk and good stability.  Normal scapular function observed.  No painful arc and no drop arm sign.  No apprehension sign  Contralateral side is unremarkable   OMT Physical Exam  Standing structural  Cervical  C2 F RS right  C 4 F RS right  Thoracic  T3-5 E R right, S left     Impression and Recommendations:

## 2013-03-26 NOTE — Assessment & Plan Note (Addendum)
Still mechanical in nature. Discuss changing work position that could be beneficial. Discussed different workouts as well as home exercises that can be helpful. I think patient does overuse it somewhat so we will limits exercises to 3 times a day. Patient and will follow up again in 3-4 weeks. Still responding to osteopathic manipulation

## 2013-03-26 NOTE — Assessment & Plan Note (Signed)
Decision today to treat with OMT was based on Physical Exam  After verbal consent patient was treated with  HVLA and ME techniques in cervical, thoracic areas Patient tolerated the procedure well with improvement in symptoms  Patient given exercises, stretches and lifestyle modifications including work positioning  See medications in patient instructions if given  Patient will follow up in 3-4 weeks

## 2013-03-26 NOTE — Assessment & Plan Note (Signed)
Decision today to treat with OMT was based on Physical Exam  After verbal consent patient was treated with  HVLA and ME techniques in cervical, thoracic areas Patient tolerated the procedure well with improvement in symptoms  Patient given exercises, stretches and lifestyle modifications including work positioning  See medications in patient instructions if given  Patient will follow up in 3-4 weeks  

## 2013-03-26 NOTE — Patient Instructions (Signed)
Always good to see you Switch work station Try a trial of 6 week of new type of exercise Continue my stretches 3 times a week.  I will get a rx of that med.

## 2013-03-26 NOTE — Assessment & Plan Note (Signed)
Patient is still having pain that seems on physical exam today to be somewhat improved. Patient still declined any type of injection today. Patient given other exercises and told to avoid overhead activity. Patient will come back again in 3-4 weeks. If she continues to have pain we will do a corticosteroid injection likely under ultrasound guidance.

## 2013-03-29 ENCOUNTER — Ambulatory Visit (INDEPENDENT_AMBULATORY_CARE_PROVIDER_SITE_OTHER): Payer: Managed Care, Other (non HMO) | Admitting: Cardiovascular Disease

## 2013-03-29 ENCOUNTER — Ambulatory Visit: Payer: Managed Care, Other (non HMO) | Admitting: Family Medicine

## 2013-03-29 DIAGNOSIS — D6859 Other primary thrombophilia: Secondary | ICD-10-CM

## 2013-03-29 DIAGNOSIS — I82409 Acute embolism and thrombosis of unspecified deep veins of unspecified lower extremity: Secondary | ICD-10-CM

## 2013-04-12 ENCOUNTER — Ambulatory Visit (INDEPENDENT_AMBULATORY_CARE_PROVIDER_SITE_OTHER): Payer: Managed Care, Other (non HMO) | Admitting: Cardiology

## 2013-04-12 DIAGNOSIS — D6859 Other primary thrombophilia: Secondary | ICD-10-CM

## 2013-04-12 DIAGNOSIS — I82409 Acute embolism and thrombosis of unspecified deep veins of unspecified lower extremity: Secondary | ICD-10-CM

## 2013-04-15 ENCOUNTER — Ambulatory Visit (INDEPENDENT_AMBULATORY_CARE_PROVIDER_SITE_OTHER): Payer: Managed Care, Other (non HMO) | Admitting: Pharmacist

## 2013-04-15 DIAGNOSIS — I82409 Acute embolism and thrombosis of unspecified deep veins of unspecified lower extremity: Secondary | ICD-10-CM

## 2013-04-15 DIAGNOSIS — D6859 Other primary thrombophilia: Secondary | ICD-10-CM

## 2013-04-27 ENCOUNTER — Ambulatory Visit (INDEPENDENT_AMBULATORY_CARE_PROVIDER_SITE_OTHER): Payer: Managed Care, Other (non HMO) | Admitting: Cardiology

## 2013-04-27 DIAGNOSIS — D6859 Other primary thrombophilia: Secondary | ICD-10-CM

## 2013-04-27 DIAGNOSIS — I82409 Acute embolism and thrombosis of unspecified deep veins of unspecified lower extremity: Secondary | ICD-10-CM

## 2013-04-27 LAB — POCT INR: INR: 3.5

## 2013-05-13 ENCOUNTER — Encounter: Payer: Self-pay | Admitting: Cardiology

## 2013-05-13 ENCOUNTER — Ambulatory Visit (INDEPENDENT_AMBULATORY_CARE_PROVIDER_SITE_OTHER): Payer: Managed Care, Other (non HMO) | Admitting: Cardiovascular Disease

## 2013-05-13 DIAGNOSIS — D6859 Other primary thrombophilia: Secondary | ICD-10-CM

## 2013-05-13 DIAGNOSIS — I82409 Acute embolism and thrombosis of unspecified deep veins of unspecified lower extremity: Secondary | ICD-10-CM

## 2013-05-13 LAB — PROTIME-INR: INR: 2.9 — AB (ref 0.9–1.1)

## 2013-05-18 ENCOUNTER — Encounter: Payer: Self-pay | Admitting: Family Medicine

## 2013-05-19 ENCOUNTER — Other Ambulatory Visit: Payer: Self-pay | Admitting: Family Medicine

## 2013-05-19 ENCOUNTER — Other Ambulatory Visit: Payer: Self-pay | Admitting: Cardiology

## 2013-05-19 NOTE — Telephone Encounter (Signed)
Veramyst refilled

## 2013-05-27 ENCOUNTER — Encounter: Payer: Self-pay | Admitting: Pharmacist

## 2013-05-28 ENCOUNTER — Ambulatory Visit (INDEPENDENT_AMBULATORY_CARE_PROVIDER_SITE_OTHER): Payer: Managed Care, Other (non HMO)

## 2013-05-28 DIAGNOSIS — D6859 Other primary thrombophilia: Secondary | ICD-10-CM

## 2013-05-28 DIAGNOSIS — I82409 Acute embolism and thrombosis of unspecified deep veins of unspecified lower extremity: Secondary | ICD-10-CM

## 2013-06-08 ENCOUNTER — Ambulatory Visit (INDEPENDENT_AMBULATORY_CARE_PROVIDER_SITE_OTHER): Payer: Managed Care, Other (non HMO) | Admitting: Family Medicine

## 2013-06-08 ENCOUNTER — Encounter: Payer: Self-pay | Admitting: Family Medicine

## 2013-06-08 VITALS — BP 114/76 | HR 71 | Temp 98.4°F | Wt 121.0 lb

## 2013-06-08 DIAGNOSIS — F429 Obsessive-compulsive disorder, unspecified: Secondary | ICD-10-CM

## 2013-06-08 DIAGNOSIS — F418 Other specified anxiety disorders: Secondary | ICD-10-CM

## 2013-06-08 DIAGNOSIS — F341 Dysthymic disorder: Secondary | ICD-10-CM

## 2013-06-08 DIAGNOSIS — F988 Other specified behavioral and emotional disorders with onset usually occurring in childhood and adolescence: Secondary | ICD-10-CM

## 2013-06-08 LAB — BASIC METABOLIC PANEL WITH GFR
BUN: 11 mg/dL (ref 6–23)
CO2: 26 meq/L (ref 19–32)
Calcium: 9.1 mg/dL (ref 8.4–10.5)
Chloride: 102 meq/L (ref 96–112)
Creatinine, Ser: 0.7 mg/dL (ref 0.4–1.2)
GFR: 110 mL/min
Glucose, Bld: 97 mg/dL (ref 70–99)
Potassium: 3.8 meq/L (ref 3.5–5.1)
Sodium: 137 meq/L (ref 135–145)

## 2013-06-08 LAB — CBC WITH DIFFERENTIAL/PLATELET
Basophils Absolute: 0 K/uL (ref 0.0–0.1)
Basophils Relative: 0.9 % (ref 0.0–3.0)
Eosinophils Absolute: 0.1 K/uL (ref 0.0–0.7)
Eosinophils Relative: 1.3 % (ref 0.0–5.0)
HCT: 40.5 % (ref 36.0–46.0)
Hemoglobin: 13.7 g/dL (ref 12.0–15.0)
Lymphocytes Relative: 24.2 % (ref 12.0–46.0)
Lymphs Abs: 1 K/uL (ref 0.7–4.0)
MCHC: 33.9 g/dL (ref 30.0–36.0)
MCV: 92.1 fl (ref 78.0–100.0)
Monocytes Absolute: 0.5 K/uL (ref 0.1–1.0)
Monocytes Relative: 12.1 % — ABNORMAL HIGH (ref 3.0–12.0)
Neutro Abs: 2.5 K/uL (ref 1.4–7.7)
Neutrophils Relative %: 61.5 % (ref 43.0–77.0)
Platelets: 255 K/uL (ref 150.0–400.0)
RBC: 4.4 Mil/uL (ref 3.87–5.11)
RDW: 12.7 % (ref 11.5–14.6)
WBC: 4.1 K/uL — ABNORMAL LOW (ref 4.5–10.5)

## 2013-06-08 LAB — TSH: TSH: 0.67 u[IU]/mL (ref 0.35–5.50)

## 2013-06-08 LAB — HEPATIC FUNCTION PANEL
ALT: 20 U/L (ref 0–35)
AST: 32 U/L (ref 0–37)
Albumin: 4.4 g/dL (ref 3.5–5.2)
Alkaline Phosphatase: 35 U/L — ABNORMAL LOW (ref 39–117)
Bilirubin, Direct: 0 mg/dL (ref 0.0–0.3)
Total Bilirubin: 0.7 mg/dL (ref 0.3–1.2)
Total Protein: 7.2 g/dL (ref 6.0–8.3)

## 2013-06-08 LAB — VITAMIN B12: Vitamin B-12: 441 pg/mL (ref 211–911)

## 2013-06-08 MED ORDER — LISDEXAMFETAMINE DIMESYLATE 70 MG PO CAPS: 70.0000 mg | ORAL_CAPSULE | Freq: Every day | ORAL | Status: DC

## 2013-06-08 MED ORDER — ALPRAZOLAM 0.25 MG PO TABS: 0.2500 mg | ORAL_TABLET | Freq: Three times a day (TID) | ORAL | Status: DC | PRN

## 2013-06-08 MED ORDER — LISDEXAMFETAMINE DIMESYLATE 70 MG PO CAPS: 70.0000 mg | ORAL_CAPSULE | ORAL | Status: DC

## 2013-06-08 MED ORDER — ESCITALOPRAM OXALATE 10 MG PO TABS: 10.0000 mg | ORAL_TABLET | Freq: Every day | ORAL | Status: DC

## 2013-06-08 NOTE — Assessment & Plan Note (Signed)
Refill meds stable 

## 2013-06-08 NOTE — Progress Notes (Signed)
Pre visit review using our clinic review tool, if applicable. No additional management support is needed unless otherwise documented below in the visit note. 

## 2013-06-08 NOTE — Assessment & Plan Note (Signed)
Refer to counseling  See meds and orders

## 2013-06-08 NOTE — Progress Notes (Signed)
   Subjective:    Patient ID: Michele Turner, female    DOB: 01-26-1976, 37 y.o.   MRN: [id number]  HPI Pt here to f/u ADD and to discuss new depression symptoms and worsening ocd.  Pt is not suicidal /homicidal.  Pt is requesting a Veterinary surgeon.     Review of Systems As above    Objective:   Physical Exam BP 114/76  Pulse 71  Temp(Src) 98.4 F (36.9 C) (Oral)  Wt 121 lb (54.885 kg)  SpO2 97% General appearance: alert, cooperative, appears stated age and no distress Neurologic: Alert and oriented X 3, normal strength and tone. Normal symmetric reflexes. Normal coordination and gait Psych-- anxious, fast speech       Assessment & Plan:

## 2013-06-08 NOTE — Assessment & Plan Note (Signed)
Restart lexapro 10 mg  Refill xanax

## 2013-06-08 NOTE — Patient Instructions (Signed)

## 2013-06-10 ENCOUNTER — Ambulatory Visit (INDEPENDENT_AMBULATORY_CARE_PROVIDER_SITE_OTHER): Payer: Managed Care, Other (non HMO) | Admitting: Psychology

## 2013-06-10 DIAGNOSIS — F429 Obsessive-compulsive disorder, unspecified: Secondary | ICD-10-CM

## 2013-06-17 ENCOUNTER — Ambulatory Visit (INDEPENDENT_AMBULATORY_CARE_PROVIDER_SITE_OTHER): Payer: Managed Care, Other (non HMO) | Admitting: Pharmacist

## 2013-06-17 DIAGNOSIS — I82409 Acute embolism and thrombosis of unspecified deep veins of unspecified lower extremity: Secondary | ICD-10-CM

## 2013-06-17 DIAGNOSIS — D6859 Other primary thrombophilia: Secondary | ICD-10-CM

## 2013-06-23 ENCOUNTER — Ambulatory Visit: Payer: Managed Care, Other (non HMO) | Admitting: Psychology

## 2013-06-29 ENCOUNTER — Ambulatory Visit (INDEPENDENT_AMBULATORY_CARE_PROVIDER_SITE_OTHER): Payer: Managed Care, Other (non HMO) | Admitting: Psychology

## 2013-06-29 DIAGNOSIS — F429 Obsessive-compulsive disorder, unspecified: Secondary | ICD-10-CM

## 2013-07-02 LAB — POCT INR: INR: 3.4

## 2013-07-05 ENCOUNTER — Ambulatory Visit (INDEPENDENT_AMBULATORY_CARE_PROVIDER_SITE_OTHER): Payer: Managed Care, Other (non HMO) | Admitting: Pharmacist

## 2013-07-05 DIAGNOSIS — D6859 Other primary thrombophilia: Secondary | ICD-10-CM

## 2013-07-05 DIAGNOSIS — I82409 Acute embolism and thrombosis of unspecified deep veins of unspecified lower extremity: Secondary | ICD-10-CM

## 2013-07-07 ENCOUNTER — Ambulatory Visit (INDEPENDENT_AMBULATORY_CARE_PROVIDER_SITE_OTHER): Payer: Managed Care, Other (non HMO) | Admitting: Family Medicine

## 2013-07-07 ENCOUNTER — Encounter: Payer: Self-pay | Admitting: Family Medicine

## 2013-07-07 VITALS — BP 124/80 | HR 86

## 2013-07-07 DIAGNOSIS — M542 Cervicalgia: Secondary | ICD-10-CM

## 2013-07-07 DIAGNOSIS — M999 Biomechanical lesion, unspecified: Secondary | ICD-10-CM

## 2013-07-07 DIAGNOSIS — M9981 Other biomechanical lesions of cervical region: Secondary | ICD-10-CM

## 2013-07-07 NOTE — Assessment & Plan Note (Signed)
Decision today to treat with OMT was based on Physical Exam  After verbal consent patient was treated with  HVLA and ME techniques in cervical, thoracic and lumbar areas Patient tolerated the procedure well with improvement in symptoms  Patient given exercises, stretches and lifestyle modifications including work positioning  See medications in patient instructions if given  Patient will follow up in 6weeks    

## 2013-07-07 NOTE — Progress Notes (Signed)
Pre-visit discussion using our clinic review tool. No additional management support is needed unless otherwise documented below in the visit note.  

## 2013-07-07 NOTE — Progress Notes (Signed)
  Subjective:    CC: , Neck and shoulder pain  HPI: Patient is a very pleasant 37 year old female following up for neck and upper back pain. Patient has not been seen for 3 months. Patient states that it seems to be her regular pain but seems to be a little bit more aggressive. Patient has been doing significant amount of working out and has had some mild increase in stress. Patient denies any new symptoms such as radiation down the arms or any numbness.  Past medical history, Surgical history, Family history not pertinant except as noted below, Social history, Allergies, and medications have been entered into the medical record, reviewed, and no changes needed.   Review of Systems: No fevers, chills, night sweats, weight loss, chest pain, or shortness of breath.   Objective:   Blood pressure 124/80, pulse 86, SpO2 94.00%.  General: Well Developed, well nourished, and in no acute distress.  Neuro: Alert and oriented x3, extra-ocular muscles intact, sensation grossly intact.  HEENT: Normocephalic, atraumatic, pupils equal round reactive to light, neck supple, no masses, no lymphadenopathy, thyroid nonpalpable.  Skin: Warm and dry, no rashes. Cardiac:  no lower extremity edema. Respiratory: Not using accessory muscles, speaking in full sentences. Abdominal: NT, soft Gait: Nonantlagic, good balance and coordination Lymphatic: no lymphadenopathy in neck or axillae on palpation, non tender.  Musculoskeletal: Inspection and palpation of the right and left upper extremities including the shoulders elbows and wrist are unremarkable with full range of motion and good muscle strength and tone. Inspection and palpation of the right and left lower extremities including the hips knees and ankles are unremarkable and nontender with full range of motion and good muscle strength and tone and are symmetric. Neck: Inspection unremarkable. No palpable stepoffs. Negative Spurling's maneuver. Full neck range  of motion Grip strength and sensation normal in bilateral hands Strength good C4 to T1 distribution No sensory change to C4 to T1 Negative Hoffman sign bilaterally Reflexes normal Patient is tender to palpation of the right paraspinal musculature.   OMT Physical Exam  Standing structural  Cervical  C2 F RS right  C 4 F RS right  Thoracic  T3-5 E R right, S left     Impression and Recommendations:

## 2013-07-07 NOTE — Assessment & Plan Note (Signed)
Patient is doing relatively well. Patient does have a little bit of anterior protraction of the shoulders. Patient will try some posture exercises that was given to her today. Encourage her to do changes with work positioning. Patient will come back again in 6 weeks as long as she continues to respond well to osteopathic manipulation

## 2013-07-07 NOTE — Patient Instructions (Signed)
You were messed up Happy new year! Try posture exercise on wall for up to 5 minutes a day Try the neck exercises against the hand.  Come back again in 6 weeks.

## 2013-07-07 NOTE — Assessment & Plan Note (Signed)
Decision today to treat with OMT was based on Physical Exam  After verbal consent patient was treated with  HVLA and ME techniques in cervical, thoracic and lumbar areas Patient tolerated the procedure well with improvement in symptoms  Patient given exercises, stretches and lifestyle modifications including work positioning  See medications in patient instructions if given  Patient will follow up in 6weeks

## 2013-07-13 ENCOUNTER — Ambulatory Visit: Payer: Managed Care, Other (non HMO) | Admitting: Psychology

## 2013-07-21 LAB — POCT INR: INR: 3.3

## 2013-07-22 ENCOUNTER — Ambulatory Visit (INDEPENDENT_AMBULATORY_CARE_PROVIDER_SITE_OTHER): Payer: Managed Care, Other (non HMO) | Admitting: Psychology

## 2013-07-22 ENCOUNTER — Ambulatory Visit (INDEPENDENT_AMBULATORY_CARE_PROVIDER_SITE_OTHER): Payer: Managed Care, Other (non HMO) | Admitting: Pharmacist

## 2013-07-22 DIAGNOSIS — I82409 Acute embolism and thrombosis of unspecified deep veins of unspecified lower extremity: Secondary | ICD-10-CM

## 2013-07-22 DIAGNOSIS — F429 Obsessive-compulsive disorder, unspecified: Secondary | ICD-10-CM

## 2013-07-22 DIAGNOSIS — D6859 Other primary thrombophilia: Secondary | ICD-10-CM

## 2013-08-02 ENCOUNTER — Telehealth: Payer: Self-pay | Admitting: Obstetrics & Gynecology

## 2013-08-02 DIAGNOSIS — Z30432 Encounter for removal of intrauterine contraceptive device: Secondary | ICD-10-CM

## 2013-08-02 DIAGNOSIS — Z30430 Encounter for insertion of intrauterine contraceptive device: Secondary | ICD-10-CM

## 2013-08-02 NOTE — Telephone Encounter (Signed)
Patient has Mirena inserted 5 years ago and has been having unusual bleeding.

## 2013-08-02 NOTE — Telephone Encounter (Signed)
OK to cytotec pv pm and am before procedure.  Precert Mirena removal and replacement.  Please schedule a day u/s is here just in case I need u/s guidance.  Could you bring me old chart as I know I've done an ultrasound on her just to make sure I don't need to schedule with u/s.  Thanks.

## 2013-08-02 NOTE — Telephone Encounter (Signed)
Patient calling. She states she has been having increased periods. One each month since October and now having vaginal bleeding again. Wondering about Mirena and if still effective. Advised Mirena still effective but that per Dr. Hyacinth MeekerMiller can be removed and replaced if concerned.  Patient unable to feel strings but has been unable to feel strings for the entire length of having mirena in place.  Patient currently on Coumadin as well.   Patient agreeable to removal and replacement.  Advised can precert benefits and call back to schedule.  Okay to schedule? Okay for cytotec as well?

## 2013-08-03 NOTE — Telephone Encounter (Signed)
Called patient for current insurance information: Michele Turner Id# [id number]

## 2013-08-03 NOTE — Telephone Encounter (Signed)
Chart to your basket.

## 2013-08-03 NOTE — Telephone Encounter (Signed)
This is fine to schedule.  Thank you.  Ultrasound several years ago showed the string was in the canal so I should be able to get it out.

## 2013-08-04 ENCOUNTER — Other Ambulatory Visit: Payer: Self-pay | Admitting: Orthopedic Surgery

## 2013-08-04 MED ORDER — MISOPROSTOL 200 MCG PO TABS: 200.0000 ug | ORAL_TABLET | Freq: Once | ORAL | Status: DC

## 2013-08-04 NOTE — Telephone Encounter (Signed)
Spoke with pt to advise Dr. Hyacinth MeekerMiller is OK with IUD exchange. Instructed on cytotec PV 1 tab the night before and 1 inserted the morning of the appt. Advised pt on ibuprofen, which she says she is able to take. Pt will take 800 mg an hour before the appt with food and fluids. Scheduled appt 08-19-13 at 4:30 with SM at a time when US tech is here if needed.  Pt has many doctor's appts and will be out of town during earlier possible appt times. Requested 08-19-13.

## 2013-08-05 ENCOUNTER — Ambulatory Visit: Payer: Managed Care, Other (non HMO) | Admitting: Psychology

## 2013-08-06 ENCOUNTER — Ambulatory Visit (INDEPENDENT_AMBULATORY_CARE_PROVIDER_SITE_OTHER): Payer: Managed Care, Other (non HMO) | Admitting: Pharmacist

## 2013-08-06 DIAGNOSIS — I82409 Acute embolism and thrombosis of unspecified deep veins of unspecified lower extremity: Secondary | ICD-10-CM

## 2013-08-06 DIAGNOSIS — D6859 Other primary thrombophilia: Secondary | ICD-10-CM

## 2013-08-06 LAB — POCT INR: INR: 2.9

## 2013-08-13 ENCOUNTER — Telehealth: Payer: Self-pay | Admitting: Obstetrics & Gynecology

## 2013-08-13 NOTE — Telephone Encounter (Signed)
Confirmed that per insurance quote for insertion and removal of mirena: *service is covered at 100% of allowable

## 2013-08-17 ENCOUNTER — Encounter: Payer: Self-pay | Admitting: Obstetrics & Gynecology

## 2013-08-19 ENCOUNTER — Ambulatory Visit (INDEPENDENT_AMBULATORY_CARE_PROVIDER_SITE_OTHER): Payer: Managed Care, Other (non HMO) | Admitting: Obstetrics & Gynecology

## 2013-08-19 ENCOUNTER — Ambulatory Visit (INDEPENDENT_AMBULATORY_CARE_PROVIDER_SITE_OTHER): Payer: Managed Care, Other (non HMO) | Admitting: Psychology

## 2013-08-19 VITALS — BP 116/62 | HR 60 | Resp 16 | Ht 63.75 in | Wt 123.4 lb

## 2013-08-19 DIAGNOSIS — Z30430 Encounter for insertion of intrauterine contraceptive device: Secondary | ICD-10-CM

## 2013-08-19 DIAGNOSIS — F429 Obsessive-compulsive disorder, unspecified: Secondary | ICD-10-CM

## 2013-08-19 DIAGNOSIS — Z30432 Encounter for removal of intrauterine contraceptive device: Secondary | ICD-10-CM

## 2013-08-19 DIAGNOSIS — Z30433 Encounter for removal and reinsertion of intrauterine contraceptive device: Secondary | ICD-10-CM

## 2013-08-19 NOTE — Progress Notes (Signed)
38 y.o. 380P0000 Married Caucasian female presents for removal of and re-insertion of Mirena IUD. She has been counseled about alternative forms of birth control including oral contraceptives, progesterone methods, IUD, barrier method, and sterilization.  She has decided to proceed with IUD placement.  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  Unsure about childbearing.  Currently, she denies any vaginal symptoms or STD concerns.  Married and monogamous.  LMP:  Patient's last menstrual period was 07/23/2013. Some spotting today.  Healthy female:  WNWD WF NAD  Pelvic exam: Vulva: EXAM; GYN [id number]:[id number]}VULVA:16383} Vagina:normal vagina, no discharge, exudate, lesion, or erythema Cervix:Non-tender, Negative CMT, no lesions or redness Uterus:normal shape, position and consistency   After patient read information booklet and all questions were answered, informed consent was obtained.    Procedure:  Speculum inserted into vagina. Cervix visualized and cleansed with betadine solution X 3. Paracervical block was placed.  1% Lidocaine was used.  10cc total.  Tenaculum placed on cervix at 12 o'clock position.  Using string hook, this instrument was passed into uterine cavity and with one twist, strings came out of cervix.  Strings grasped with single toothed tenaculum and IUD removed with one pull.  Pt and I both visualized IUD before discarding.  Uterus sounded to 7 centimeters.  IUD removed from sterile packet and under sterile conditions inserted to fundus of uterus.  Introducer removed without difficulty.  IUD string trimmed to 2.5 centimeters.  Remainder string given to patient to feel for identification.  Tenaculum removed.  Some bleeding noted but patient watched until this resolved.  Patient is on anti-coagulation.  Speculum removed.  Uterus palpated normal.  Patient tolerated procedure well.  IUD Lot #:[id number].  Exp: 8/16.  Package information attached to consent and scanned into EPIC.  A:  Removal of and re-Insertion of Mirena   P: Return to office 6 weeks for recheck Pt knows IUD needs to be replaced approximately 08/19/2018. Instructions and IUD card provided.

## 2013-08-20 NOTE — Patient Instructions (Signed)

## 2013-08-23 ENCOUNTER — Ambulatory Visit (INDEPENDENT_AMBULATORY_CARE_PROVIDER_SITE_OTHER): Payer: Managed Care, Other (non HMO) | Admitting: Nurse Practitioner

## 2013-08-23 ENCOUNTER — Encounter: Payer: Self-pay | Admitting: Nurse Practitioner

## 2013-08-23 VITALS — BP 100/62 | HR 68 | Ht 63.75 in | Wt 122.0 lb

## 2013-08-23 DIAGNOSIS — Z01419 Encounter for gynecological examination (general) (routine) without abnormal findings: Secondary | ICD-10-CM

## 2013-08-23 DIAGNOSIS — I82409 Acute embolism and thrombosis of unspecified deep veins of unspecified lower extremity: Secondary | ICD-10-CM

## 2013-08-23 DIAGNOSIS — Z0000 Encounter for general adult medical examination without abnormal findings: Secondary | ICD-10-CM

## 2013-08-23 DIAGNOSIS — Z975 Presence of (intrauterine) contraceptive device: Secondary | ICD-10-CM

## 2013-08-23 LAB — POCT URINALYSIS DIPSTICK
Bilirubin, UA: NEGATIVE
Blood, UA: NEGATIVE
Glucose, UA: NEGATIVE
Ketones, UA: NEGATIVE
LEUKOCYTES UA: NEGATIVE
Nitrite, UA: NEGATIVE
PROTEIN UA: NEGATIVE
Urobilinogen, UA: NEGATIVE
pH, UA: 7

## 2013-08-23 NOTE — Patient Instructions (Signed)

## 2013-08-23 NOTE — Progress Notes (Signed)
Patient ID: Michele Turner, female   DOB: 05/28/1977[name], 38 y.o.   MRN: [id number] 38 y.o. G0P0 Married Caucasian Fe here for annual exam.  Occasional spotting every 2-3 months that was getting closer together, so she had new Mirena IUD inserted this month instead of waiting for July. Had spotting after this insertion for a few days.  Patient's last menstrual period was 07/23/2013.          Sexually active: yes  The current method of family planning is IUD.   Mirena insertion 08/19/13 Exercising: yes  Gym/ health club routine includes cardio, dance, yoga, running. Smoker:  no  Health Maintenance: Pap:  07/23/11, WNL, neg HR HPV Colonoscopy:  2008 normal recheck in 2010 TDaP:  2009 Labs: HB: PCP Urine:  Negative, pH 7.0   reports that she has never smoked. She has never used smokeless tobacco. She reports that she drinks alcohol. She reports that she does not use illicit drugs.  Past Medical History  Diagnosis Date  . GERD (gastroesophageal reflux disease)   . Hiatal hernia   . DVT (deep venous thrombosis) 12/26/08    on OCP - YASMIN  . Anxiety     Past Surgical History  Procedure Laterality Date  . Breast enhancement surgery    . Intrauterine device insertion  02/02/09    Mirena  . Intrauterine device insertion  08/19/13    Mirena    Current Outpatient Prescriptions  Medication Sig Dispense Refill  . ALPRAZolam (XANAX) 0.25 MG tablet Take 1 tablet (0.25 mg total) by mouth 3 (three) times daily as needed.  30 tablet  0  . BACTROBAN 2 % ointment       . COUMADIN 5 MG tablet TAKE AS DIRECTED BY ANTICOAGULATION CLINIC. [name]. UP TO 3 TABLETS DAILY.  90 tablet  2  . diazepam (VALIUM) 5 MG tablet Take 1 tablet (5 mg total) by mouth every 12 (twelve) hours as needed for anxiety.  30 tablet  1  . escitalopram (LEXAPRO) 10 MG tablet Take 1 tablet (10 mg total) by mouth daily.  90 tablet  3  . famotidine (PEPCID) 20 MG tablet Take 1 tablet (20 mg total) by mouth 2 (two) times daily.  30 tablet   1  . ibuprofen (ADVIL,MOTRIN) 800 MG tablet Take 1 tablet (800 mg total) by mouth every 8 (eight) hours as needed for pain.  30 tablet  3  . levonorgestrel (MIRENA) 20 MCG/24HR IUD 1 each by Intrauterine route once.        . lisdexamfetamine (VYVANSE) 70 MG capsule Take 1 capsule (70 mg total) by mouth daily.  30 capsule  0  . omeprazole-sodium bicarbonate (ZEGERID) 40-1100 MG per capsule Take 1 capsule by mouth daily.        [name]. URELLE (URELLE/URISED) 81 MG TABS Take 1 tablet (81 mg total) by mouth as directed.  120 each  5  . valACYclovir (VALTREX) 1000 MG tablet As directed  30 tablet  11  . VERAMYST 27.5 MCG/SPRAY nasal spray USE 2 SPRAYS INTO THE NOSE DAILY.  10 g  5   No current facility-administered medications for this visit.    Family History  Problem Relation Age of Onset  . Asthma    . Hypertension    . Hyperlipidemia    . Breast cancer    . Colon cancer Paternal Grandmother     and paternal great grandfather  . Hypertension Mother   . Hyperlipidemia Mother   . Heart disease Mother   .  Asthma Mother   . Pulmonary embolism Mother   . Osteopenia Mother   . Cancer Father     prostate  . Heart Problems Maternal Grandmother     born with hole in heart, now has pacemaker  . Osteoporosis Maternal Grandmother   . Diabetes Maternal Grandfather     ROS:  Pertinent items are noted in HPI.  Otherwise, a comprehensive ROS was negative.  Exam:   BP 100/62  Pulse 68  Ht 5' 3.75" (1.619 m)  Wt 122 lb (55.339 kg)  BMI 21.11 kg/m2  LMP 07/23/2013 Height: 5' 3.75" (161.9 cm)  Ht Readings from Last 3 Encounters:  08/23/13 5' 3.75" (1.619 m)  08/19/13 5' 3.75" (1.619 m)  03/25/13 5\' 4"  (1.626 m)    General appearance: alert, cooperative and appears stated age Head: Normocephalic, without obvious abnormality, atraumatic Neck: no adenopathy, supple, symmetrical, trachea midline and thyroid normal to inspection and palpation Lungs: clear to auscultation bilaterally Breasts:  normal appearance, no masses or tenderness, positive findings: implants bilaterally Heart: regular rate and rhythm Abdomen: soft, non-tender; no masses,  no organomegaly Extremities: extremities normal, atraumatic, no cyanosis or edema Skin: Skin color, texture, turgor normal. No rashes or lesions Lymph nodes: Cervical, supraclavicular, and axillary nodes normal. No abnormal inguinal nodes palpated Neurologic: Grossly normal   Pelvic: External genitalia:  no lesions              Urethra:  normal appearing urethra with no masses, tenderness or lesions              Bartholin's and Skene's: normal                 Vagina: normal appearing vagina with normal color and discharge, no lesions              Cervix: anteverted, IUD strings are visible              Pap taken: no Bimanual Exam:  Uterus:  normal size, contour, position, consistency, mobility, non-tender              Adnexa: no mass, fullness, tenderness               Rectovaginal: Confirms               Anus:  normal sphincter tone, no lesions  A:  Well Woman with normal exam  Mirena IUD for contraception - new one 08/19/13  History of DVT  On OCP (Yasmin) - on Coumadin  P:   Pap smear as per guidelines Not done  Counseled on breast self exam, adequate intake of calcium and vitamin D, diet and exercise return annually or prn  An After Visit Summary was printed and given to the patient.

## 2013-08-24 LAB — POCT INR: INR: 3.5

## 2013-08-24 NOTE — Progress Notes (Signed)
Encounter reviewed by Dr. Brook Silva.  

## 2013-08-25 ENCOUNTER — Ambulatory Visit (INDEPENDENT_AMBULATORY_CARE_PROVIDER_SITE_OTHER): Payer: Managed Care, Other (non HMO) | Admitting: Pharmacist

## 2013-08-25 DIAGNOSIS — D6859 Other primary thrombophilia: Secondary | ICD-10-CM

## 2013-08-25 DIAGNOSIS — I82409 Acute embolism and thrombosis of unspecified deep veins of unspecified lower extremity: Secondary | ICD-10-CM

## 2013-09-02 ENCOUNTER — Ambulatory Visit: Payer: Managed Care, Other (non HMO) | Admitting: Psychology

## 2013-09-08 LAB — POCT INR: INR: 3

## 2013-09-09 ENCOUNTER — Other Ambulatory Visit: Payer: Self-pay | Admitting: Cardiology

## 2013-09-09 ENCOUNTER — Ambulatory Visit (INDEPENDENT_AMBULATORY_CARE_PROVIDER_SITE_OTHER): Payer: Managed Care, Other (non HMO) | Admitting: Pharmacist

## 2013-09-09 DIAGNOSIS — D6859 Other primary thrombophilia: Secondary | ICD-10-CM

## 2013-09-09 DIAGNOSIS — I82409 Acute embolism and thrombosis of unspecified deep veins of unspecified lower extremity: Secondary | ICD-10-CM

## 2013-09-15 LAB — POCT INR: INR: 3.1

## 2013-09-16 ENCOUNTER — Ambulatory Visit: Payer: Managed Care, Other (non HMO) | Admitting: Psychology

## 2013-09-22 ENCOUNTER — Ambulatory Visit (INDEPENDENT_AMBULATORY_CARE_PROVIDER_SITE_OTHER): Payer: Managed Care, Other (non HMO) | Admitting: Pharmacist

## 2013-09-22 DIAGNOSIS — D6859 Other primary thrombophilia: Secondary | ICD-10-CM

## 2013-09-22 DIAGNOSIS — I82409 Acute embolism and thrombosis of unspecified deep veins of unspecified lower extremity: Secondary | ICD-10-CM

## 2013-09-30 ENCOUNTER — Ambulatory Visit: Payer: Managed Care, Other (non HMO) | Admitting: Psychology

## 2013-09-30 LAB — POCT INR: INR: 2.8

## 2013-10-01 ENCOUNTER — Ambulatory Visit (INDEPENDENT_AMBULATORY_CARE_PROVIDER_SITE_OTHER): Payer: Managed Care, Other (non HMO) | Admitting: Cardiology

## 2013-10-01 DIAGNOSIS — D6859 Other primary thrombophilia: Secondary | ICD-10-CM

## 2013-10-01 DIAGNOSIS — I82409 Acute embolism and thrombosis of unspecified deep veins of unspecified lower extremity: Secondary | ICD-10-CM

## 2013-10-12 ENCOUNTER — Ambulatory Visit (INDEPENDENT_AMBULATORY_CARE_PROVIDER_SITE_OTHER): Payer: Managed Care, Other (non HMO)

## 2013-10-12 DIAGNOSIS — Z111 Encounter for screening for respiratory tuberculosis: Secondary | ICD-10-CM

## 2013-10-14 ENCOUNTER — Ambulatory Visit (INDEPENDENT_AMBULATORY_CARE_PROVIDER_SITE_OTHER): Payer: Managed Care, Other (non HMO) | Admitting: Psychology

## 2013-10-14 DIAGNOSIS — F429 Obsessive-compulsive disorder, unspecified: Secondary | ICD-10-CM

## 2013-10-14 LAB — TB SKIN TEST
Induration: 0 mm
TB Skin Test: NEGATIVE

## 2013-10-17 LAB — POCT INR: INR: 3.2

## 2013-10-18 ENCOUNTER — Ambulatory Visit (INDEPENDENT_AMBULATORY_CARE_PROVIDER_SITE_OTHER): Payer: Managed Care, Other (non HMO) | Admitting: Cardiology

## 2013-10-18 DIAGNOSIS — D6859 Other primary thrombophilia: Secondary | ICD-10-CM

## 2013-10-18 DIAGNOSIS — I82409 Acute embolism and thrombosis of unspecified deep veins of unspecified lower extremity: Secondary | ICD-10-CM

## 2013-10-29 ENCOUNTER — Ambulatory Visit (INDEPENDENT_AMBULATORY_CARE_PROVIDER_SITE_OTHER): Payer: Managed Care, Other (non HMO) | Admitting: Internal Medicine

## 2013-10-29 DIAGNOSIS — I82409 Acute embolism and thrombosis of unspecified deep veins of unspecified lower extremity: Secondary | ICD-10-CM

## 2013-10-29 DIAGNOSIS — D6859 Other primary thrombophilia: Secondary | ICD-10-CM

## 2013-10-29 LAB — POCT INR: INR: 3.4

## 2013-11-14 LAB — POCT INR: INR: 3.4

## 2013-11-15 ENCOUNTER — Ambulatory Visit (INDEPENDENT_AMBULATORY_CARE_PROVIDER_SITE_OTHER): Payer: Managed Care, Other (non HMO) | Admitting: Cardiology

## 2013-11-15 DIAGNOSIS — I82409 Acute embolism and thrombosis of unspecified deep veins of unspecified lower extremity: Secondary | ICD-10-CM

## 2013-11-15 DIAGNOSIS — D6859 Other primary thrombophilia: Secondary | ICD-10-CM

## 2013-11-25 ENCOUNTER — Ambulatory Visit: Payer: Managed Care, Other (non HMO) | Admitting: Psychology

## 2013-11-25 LAB — POCT INR: INR: 2.9

## 2013-11-26 ENCOUNTER — Ambulatory Visit (INDEPENDENT_AMBULATORY_CARE_PROVIDER_SITE_OTHER): Payer: Managed Care, Other (non HMO) | Admitting: Cardiology

## 2013-11-26 DIAGNOSIS — I82409 Acute embolism and thrombosis of unspecified deep veins of unspecified lower extremity: Secondary | ICD-10-CM

## 2013-11-26 DIAGNOSIS — D6859 Other primary thrombophilia: Secondary | ICD-10-CM

## 2013-12-09 LAB — POCT INR
INR: 3.1
INR: 3.3

## 2013-12-10 ENCOUNTER — Ambulatory Visit (INDEPENDENT_AMBULATORY_CARE_PROVIDER_SITE_OTHER): Payer: Managed Care, Other (non HMO) | Admitting: Cardiovascular Disease

## 2013-12-10 DIAGNOSIS — I82409 Acute embolism and thrombosis of unspecified deep veins of unspecified lower extremity: Secondary | ICD-10-CM

## 2013-12-10 DIAGNOSIS — D6859 Other primary thrombophilia: Secondary | ICD-10-CM

## 2013-12-28 ENCOUNTER — Other Ambulatory Visit: Payer: Self-pay | Admitting: Cardiology

## 2013-12-28 LAB — POCT INR: INR: 3.6

## 2013-12-29 ENCOUNTER — Ambulatory Visit (INDEPENDENT_AMBULATORY_CARE_PROVIDER_SITE_OTHER): Payer: Managed Care, Other (non HMO) | Admitting: Pharmacist

## 2013-12-29 DIAGNOSIS — I82409 Acute embolism and thrombosis of unspecified deep veins of unspecified lower extremity: Secondary | ICD-10-CM

## 2013-12-29 DIAGNOSIS — D6859 Other primary thrombophilia: Secondary | ICD-10-CM

## 2014-01-05 ENCOUNTER — Other Ambulatory Visit (INDEPENDENT_AMBULATORY_CARE_PROVIDER_SITE_OTHER): Payer: Managed Care, Other (non HMO)

## 2014-01-05 ENCOUNTER — Encounter: Payer: Self-pay | Admitting: Family Medicine

## 2014-01-05 ENCOUNTER — Ambulatory Visit (INDEPENDENT_AMBULATORY_CARE_PROVIDER_SITE_OTHER): Payer: Managed Care, Other (non HMO) | Admitting: Family Medicine

## 2014-01-05 VITALS — BP 116/70 | HR 78 | Ht 64.0 in | Wt 120.0 lb

## 2014-01-05 DIAGNOSIS — M719 Bursopathy, unspecified: Secondary | ICD-10-CM

## 2014-01-05 DIAGNOSIS — M7551 Bursitis of right shoulder: Secondary | ICD-10-CM | POA: Insufficient documentation

## 2014-01-05 DIAGNOSIS — M67919 Unspecified disorder of synovium and tendon, unspecified shoulder: Secondary | ICD-10-CM

## 2014-01-05 DIAGNOSIS — M25511 Pain in right shoulder: Secondary | ICD-10-CM

## 2014-01-05 DIAGNOSIS — M25519 Pain in unspecified shoulder: Secondary | ICD-10-CM

## 2014-01-05 NOTE — Progress Notes (Signed)
Michele ScaleZach Turner D.O. [facility] [address] [place], [place] [address] Phone: [phone] Subjective:      CC: Right shoulder pain  BJY:NWGNFAOZHYHPI:Subjective Michele CurbMargaret Turner is a 38 y.o. female coming in with complaint of right shoulder pain. Patient states that for multiple months and seems to be getting worse. Patient states most the pain is on the anterior lateral aspect of the shoulder but no significant radiation. Patient denies any numbness or weakness but states that the pain is worse at night. Patient states when she lays down she is a dull aching sensation. Does have a sharp sensation when she does such things as get her seatbelt in her car. Patient has not been lifting as much and is no longer doing pushups secondary to the discomfort. Patient rates the severity is 6/10. Does respond moderately to Aleve.     Past medical history, social, surgical and family history all reviewed in electronic medical record.   Review of Systems: No headache, visual changes, nausea, vomiting, diarrhea, constipation, dizziness, abdominal pain, skin rash, fevers, chills, night sweats, weight loss, swollen lymph nodes, body aches, joint swelling, muscle aches, chest pain, shortness of breath, mood changes.   Objective Blood pressure 116/70, pulse 78, height 5\' 4"  (1.626 m), weight 120 lb (54.432 kg), SpO2 98.00%.  General: No apparent distress alert and oriented x3 mood and affect normal, dressed appropriately.  HEENT: Pupils equal, extraocular movements intact  Respiratory: Patient's speak in full sentences and does not appear short of breath  Cardiovascular: No lower extremity edema, non tender, no erythema  Skin: Warm dry intact with no signs of infection or rash on extremities or on axial skeleton.  Abdomen: Soft nontender  Neuro: Cranial nerves II through XII are intact, neurovascularly intact in all extremities with 2+ DTRs and 2+ pulses.  Lymph: No lymphadenopathy of posterior or anterior  cervical chain or axillae bilaterally.  Gait normal with good balance and coordination.  MSK:  Non tender with full range of motion and good stability and symmetric strength and tone of  elbows, wrist, hip, knee and ankles bilaterally.  Shoulder: Right Inspection reveals no abnormalities, atrophy or asymmetry. Palpation is normal with no tenderness over AC joint or bicipital groove. ROM is full in all planes passively. Rotator cuff strength normal throughout. signs of impingement with positive Neer and Hawkin's tests, but negative empty can sign. Speeds and Yergason's tests normal. No labral pathology noted with negative Obrien's, negative clunk and good stability. Normal scapular function observed. No painful arc and no drop arm sign. No apprehension sign  MSK US performed of: Right This study was ordered, performed, and interpreted by Terrilee FilesZach Turner D.O.  Shoulder:   Supraspinatus:  Appears normal on long and transverse views, Bursal bulge seen with shoulder abduction on impingement view. Infraspinatus:  Appears normal on long and transverse views. Significant increase in Doppler flow Subscapularis:  Appears normal on long and transverse views. Positive bursa Teres Minor:  Appears normal on long and transverse views. AC joint:  Capsule undistended, no geyser sign. Glenohumeral Joint:  Appears normal without effusion. Glenoid Labrum:  Intact without visualized tears. Biceps Tendon:  Appears normal on long and transverse views, no fraying of tendon, tendon located in intertubercular groove, no subluxation with shoulder internal or external rotation.  Impression: Subacromial bursitis  Procedure: Real-time Ultrasound Guided Injection of right glenohumeral joint Device: GE Logiq E  Ultrasound guided injection is preferred based studies that show increased duration, increased effect, greater accuracy, decreased procedural pain,  increased response rate with ultrasound guided versus blind  injection.  Verbal informed consent obtained.  Time-out conducted.  Noted no overlying erythema, induration, or other signs of local infection.  Skin prepped in a sterile fashion.  Local anesthesia: Topical Ethyl chloride.  With sterile technique and under real time ultrasound guidance:  Joint visualized.  23g 1  inch needle inserted posterior approach. Pictures taken for needle placement. Patient did have injection of 2 cc of 1% lidocaine, 2 cc of 0.5% Marcaine, and 1.0 cc of Kenalog 40 mg/dL. Completed without difficulty  Pain immediately resolved suggesting accurate placement of the medication.  Advised to call if fevers/chills, erythema, induration, drainage, or persistent bleeding.  Images permanently stored and available for review in the ultrasound unit.  Impression: Technically successful ultrasound guided injection.     Impression and Recommendations:     This case required medical decision making of moderate complexity.

## 2014-01-05 NOTE — Assessment & Plan Note (Addendum)
Patient was given injection today and tolerated the procedure very well. We discussed icing protocol. We discussed home exercise program which I think would be beneficial as well. Patient will try these interventions and come in to 4 weeks for further evaluation and treatment. Patient continues to have trouble we may need to start osteopathic manipulation again, x-rays of the neck as well as shoulder, potentially further intervention or imaging.  Spent greater than 25 minutes with patient face-to-face and had greater than 50% of counseling including as described above in assessment and plan.

## 2014-01-05 NOTE — Patient Instructions (Signed)
Good to see you Exercises 3 times a week.  Today take easy then 50% tomorrow and then back to regular.  Ice 20 minutes 2 times daily.  Come back in 3-4 weeks if you need me.

## 2014-01-06 ENCOUNTER — Ambulatory Visit (INDEPENDENT_AMBULATORY_CARE_PROVIDER_SITE_OTHER): Payer: Managed Care, Other (non HMO) | Admitting: Pharmacist

## 2014-01-06 DIAGNOSIS — D6859 Other primary thrombophilia: Secondary | ICD-10-CM

## 2014-01-06 DIAGNOSIS — I82409 Acute embolism and thrombosis of unspecified deep veins of unspecified lower extremity: Secondary | ICD-10-CM

## 2014-01-06 LAB — POCT INR: INR: 3.2

## 2014-01-20 LAB — POCT INR: INR: 3.4

## 2014-01-21 ENCOUNTER — Ambulatory Visit (INDEPENDENT_AMBULATORY_CARE_PROVIDER_SITE_OTHER): Payer: Managed Care, Other (non HMO) | Admitting: Internal Medicine

## 2014-01-21 DIAGNOSIS — D6859 Other primary thrombophilia: Secondary | ICD-10-CM

## 2014-01-21 DIAGNOSIS — I82409 Acute embolism and thrombosis of unspecified deep veins of unspecified lower extremity: Secondary | ICD-10-CM

## 2014-02-03 ENCOUNTER — Ambulatory Visit (INDEPENDENT_AMBULATORY_CARE_PROVIDER_SITE_OTHER): Payer: Managed Care, Other (non HMO) | Admitting: Psychology

## 2014-02-03 DIAGNOSIS — F429 Obsessive-compulsive disorder, unspecified: Secondary | ICD-10-CM

## 2014-02-08 LAB — POCT INR: INR: 3.3

## 2014-02-09 ENCOUNTER — Ambulatory Visit (INDEPENDENT_AMBULATORY_CARE_PROVIDER_SITE_OTHER): Payer: Managed Care, Other (non HMO) | Admitting: Cardiology

## 2014-02-09 DIAGNOSIS — D6859 Other primary thrombophilia: Secondary | ICD-10-CM

## 2014-02-09 DIAGNOSIS — I82409 Acute embolism and thrombosis of unspecified deep veins of unspecified lower extremity: Secondary | ICD-10-CM

## 2014-02-25 ENCOUNTER — Ambulatory Visit (INDEPENDENT_AMBULATORY_CARE_PROVIDER_SITE_OTHER): Payer: Managed Care, Other (non HMO) | Admitting: Cardiovascular Disease

## 2014-02-25 DIAGNOSIS — I82409 Acute embolism and thrombosis of unspecified deep veins of unspecified lower extremity: Secondary | ICD-10-CM

## 2014-02-25 DIAGNOSIS — D6859 Other primary thrombophilia: Secondary | ICD-10-CM

## 2014-02-25 LAB — POCT INR: INR: 4.6

## 2014-03-03 ENCOUNTER — Ambulatory Visit: Payer: Managed Care, Other (non HMO) | Admitting: Psychology

## 2014-03-04 ENCOUNTER — Ambulatory Visit (INDEPENDENT_AMBULATORY_CARE_PROVIDER_SITE_OTHER): Payer: Managed Care, Other (non HMO) | Admitting: Pharmacist

## 2014-03-04 DIAGNOSIS — I82409 Acute embolism and thrombosis of unspecified deep veins of unspecified lower extremity: Secondary | ICD-10-CM

## 2014-03-04 DIAGNOSIS — D6859 Other primary thrombophilia: Secondary | ICD-10-CM

## 2014-03-04 LAB — POCT INR: INR: 2.7

## 2014-03-11 ENCOUNTER — Ambulatory Visit (INDEPENDENT_AMBULATORY_CARE_PROVIDER_SITE_OTHER): Payer: Managed Care, Other (non HMO)

## 2014-03-11 DIAGNOSIS — D6859 Other primary thrombophilia: Secondary | ICD-10-CM

## 2014-03-11 DIAGNOSIS — I82409 Acute embolism and thrombosis of unspecified deep veins of unspecified lower extremity: Secondary | ICD-10-CM

## 2014-03-11 LAB — POCT INR: INR: 3.2

## 2014-03-23 ENCOUNTER — Ambulatory Visit (INDEPENDENT_AMBULATORY_CARE_PROVIDER_SITE_OTHER): Payer: Managed Care, Other (non HMO) | Admitting: Cardiovascular Disease

## 2014-03-23 DIAGNOSIS — D6859 Other primary thrombophilia: Secondary | ICD-10-CM

## 2014-03-23 DIAGNOSIS — I82409 Acute embolism and thrombosis of unspecified deep veins of unspecified lower extremity: Secondary | ICD-10-CM

## 2014-03-23 LAB — PROTIME-INR: INR: 3 — AB (ref 0.9–1.1)

## 2014-04-09 LAB — POCT INR: INR: 2.6

## 2014-04-12 ENCOUNTER — Ambulatory Visit (INDEPENDENT_AMBULATORY_CARE_PROVIDER_SITE_OTHER): Payer: Managed Care, Other (non HMO) | Admitting: Cardiology

## 2014-04-12 DIAGNOSIS — D6859 Other primary thrombophilia: Secondary | ICD-10-CM

## 2014-04-12 DIAGNOSIS — D6852 Prothrombin gene mutation: Secondary | ICD-10-CM

## 2014-04-28 LAB — POCT INR: INR: 1.9

## 2014-04-29 ENCOUNTER — Ambulatory Visit (INDEPENDENT_AMBULATORY_CARE_PROVIDER_SITE_OTHER): Payer: Managed Care, Other (non HMO) | Admitting: Interventional Cardiology

## 2014-04-29 DIAGNOSIS — D6852 Prothrombin gene mutation: Secondary | ICD-10-CM

## 2014-04-29 DIAGNOSIS — D6859 Other primary thrombophilia: Secondary | ICD-10-CM

## 2014-05-02 ENCOUNTER — Other Ambulatory Visit: Payer: Self-pay | Admitting: Family Medicine

## 2014-05-16 LAB — POCT INR: INR: 2.5

## 2014-05-17 ENCOUNTER — Ambulatory Visit (INDEPENDENT_AMBULATORY_CARE_PROVIDER_SITE_OTHER): Payer: Managed Care, Other (non HMO) | Admitting: Cardiovascular Disease

## 2014-05-17 DIAGNOSIS — D6859 Other primary thrombophilia: Secondary | ICD-10-CM

## 2014-05-17 DIAGNOSIS — D6852 Prothrombin gene mutation: Secondary | ICD-10-CM

## 2014-05-24 LAB — POCT INR: INR: 2.7

## 2014-05-30 ENCOUNTER — Ambulatory Visit (INDEPENDENT_AMBULATORY_CARE_PROVIDER_SITE_OTHER): Payer: Managed Care, Other (non HMO) | Admitting: Pharmacist

## 2014-05-30 DIAGNOSIS — D6859 Other primary thrombophilia: Secondary | ICD-10-CM

## 2014-05-30 DIAGNOSIS — D6852 Prothrombin gene mutation: Secondary | ICD-10-CM

## 2014-06-02 LAB — POCT INR: INR: 2.5

## 2014-06-06 ENCOUNTER — Ambulatory Visit (INDEPENDENT_AMBULATORY_CARE_PROVIDER_SITE_OTHER): Payer: Managed Care, Other (non HMO) | Admitting: Cardiovascular Disease

## 2014-06-06 DIAGNOSIS — D6859 Other primary thrombophilia: Secondary | ICD-10-CM

## 2014-06-06 DIAGNOSIS — D6852 Prothrombin gene mutation: Secondary | ICD-10-CM

## 2014-06-08 ENCOUNTER — Ambulatory Visit (INDEPENDENT_AMBULATORY_CARE_PROVIDER_SITE_OTHER): Payer: Managed Care, Other (non HMO) | Admitting: Family Medicine

## 2014-06-08 ENCOUNTER — Other Ambulatory Visit (INDEPENDENT_AMBULATORY_CARE_PROVIDER_SITE_OTHER): Payer: Managed Care, Other (non HMO)

## 2014-06-08 ENCOUNTER — Encounter: Payer: Self-pay | Admitting: Family Medicine

## 2014-06-08 VITALS — BP 110/62 | HR 75 | Ht 64.0 in

## 2014-06-08 DIAGNOSIS — M6289 Other specified disorders of muscle: Secondary | ICD-10-CM

## 2014-06-08 DIAGNOSIS — M6580 Other synovitis and tenosynovitis, unspecified site: Secondary | ICD-10-CM

## 2014-06-08 DIAGNOSIS — M79604 Pain in right leg: Secondary | ICD-10-CM

## 2014-06-08 DIAGNOSIS — M76899 Other specified enthesopathies of unspecified lower limb, excluding foot: Secondary | ICD-10-CM | POA: Insufficient documentation

## 2014-06-08 LAB — CBC WITH DIFFERENTIAL/PLATELET
BASOS ABS: 0 10*3/uL (ref 0.0–0.1)
Basophils Relative: 0.8 % (ref 0.0–3.0)
EOS ABS: 0.1 10*3/uL (ref 0.0–0.7)
Eosinophils Relative: 1.7 % (ref 0.0–5.0)
HCT: 41.5 % (ref 36.0–46.0)
HEMOGLOBIN: 13.7 g/dL (ref 12.0–15.0)
LYMPHS PCT: 26.9 % (ref 12.0–46.0)
Lymphs Abs: 1.4 10*3/uL (ref 0.7–4.0)
MCHC: 33.1 g/dL (ref 30.0–36.0)
MCV: 93.6 fl (ref 78.0–100.0)
Monocytes Absolute: 0.8 10*3/uL (ref 0.1–1.0)
Monocytes Relative: 14.1 % — ABNORMAL HIGH (ref 3.0–12.0)
Neutro Abs: 3 10*3/uL (ref 1.4–7.7)
Neutrophils Relative %: 56.5 % (ref 43.0–77.0)
Platelets: 249 10*3/uL (ref 150.0–400.0)
RBC: 4.44 Mil/uL (ref 3.87–5.11)
RDW: 13 % (ref 11.5–15.5)
WBC: 5.4 10*3/uL (ref 4.0–10.5)

## 2014-06-08 LAB — FERRITIN: Ferritin: 58 ng/mL (ref 10.0–291.0)

## 2014-06-08 LAB — IRON: Iron: 89 ug/dL (ref 42–145)

## 2014-06-08 LAB — VITAMIN B12: Vitamin B-12: 477 pg/mL (ref 211–911)

## 2014-06-08 LAB — SEDIMENTATION RATE: Sed Rate: 10 mm/hr (ref 0–22)

## 2014-06-08 LAB — VITAMIN D 25 HYDROXY (VIT D DEFICIENCY, FRACTURES): VITD: 38.45 ng/mL (ref 30.00–100.00)

## 2014-06-08 NOTE — Patient Instructions (Addendum)
Good to see you Ice 20 minutes 2 times daily. Usually after activity and before bed. Wear compression sleeve daily.  Exercises in handout 3 times daily The  Other exercises as stated.  We will get labs.  See me again in 3 weeks.

## 2014-06-08 NOTE — Progress Notes (Signed)
  Tawana ScaleZach Airanna Partin D.O. [facility] [address] [place], [place] [address] Phone: [phone] Subjective:     CC: Right buttocks pain  BJY:NWGNFAOZHYHPI:Subjective [name] is a 38 y.o. female coming in with complaint of right buttocks pain. Patient on Friday was walking and all of a sudden had some mild discomfort. Patient went to the gym and over the course of time having worsening worsening pain. Patient states she tried to wake up on Saturday and had such pain she could barely walk. Patient states since and is slowly gotten better but when she does certain activities she has a severe pain. Also hurts when she sits. Denies any radiation down her leg or any numbness. Denies any back pain. Seems to be localized over the right ischial area as where she points. The severity of pain of 7 out of 10.     Past medical history, social, surgical and family history all reviewed in electronic medical record.   Review of Systems: No headache, visual changes, nausea, vomiting, diarrhea, constipation, dizziness, abdominal pain, skin rash, fevers, chills, night sweats, weight loss, swollen lymph nodes, body aches, joint swelling, muscle aches, chest pain, shortness of breath, mood changes.   Objective Blood pressure 110/62, pulse 75, height 5\' 4"  (1.626 m), SpO2 98 %.  General: No apparent distress alert and oriented x3 mood and affect normal, dressed appropriately.  HEENT: Pupils equal, extraocular movements intact  Respiratory: Patient's speak in full sentences and does not appear short of breath  Cardiovascular: No lower extremity edema, non tender, no erythema  Skin: Warm dry intact with no signs of infection or rash on extremities or on axial skeleton.  Abdomen: Soft nontender  Neuro: Cranial nerves II through XII are intact, neurovascularly intact in all extremities with 2+ DTRs and 2+ pulses.  Lymph: No lymphadenopathy of posterior or anterior cervical chain or axillae bilaterally.  Gait  normal with good balance and coordination.  MSK:  Non tender with full range of motion and good stability and symmetric strength and tone of shoulders, elbows, wrist,  and ankles bilaterally.  Patient is tender to palpation over the ischial tuberosity. Patient is also mildly tender in the gluteal region. Patient does have mild weakness of the hamstring with 4 out of 5 strength compared to 5 out of 5 strength on the contralateral side. Patient hamstring does not have any palpable defect noted. Neurovascularly intact distally.  MSK US performed of: Right This study was ordered, performed, and interpreted by Terrilee FilesZach Oletta Buehring D.O.  Hip: Trochanteric bursa without swelling or effusion. Patient's ischial area does have hypoechoic changes and patient does have what appears to be a near full-thickness tear of the hamstring tendon. No significant avulsion noted. Increasing Doppler flow noted. Mild calcific changes and scar tissue formation already seen.  IMPRESSION:  Proximal hamstring tear no retraction.     Impression and Recommendations:     This case required medical decision making of moderate complexity.

## 2014-06-08 NOTE — Assessment & Plan Note (Signed)
Patient does have partial tearing of the hamstring tendon at its origin. No true avulsion noted. We discussed compression, home exercises, icing protocol, as well as anti-inflammatories to use sparingly with her being on anticoagulation therapy. Patient is, try to make these changes and come back in 2-3 weeks. We should work ultrasound the area again to make sure we see proper healing. Due to patient's continued injuries we are going to check labs and see if we make any underlying difficulty that could be contributing to her repetitive injuries.  Spent greater than 25 minutes with patient face-to-face and had greater than 50% of counseling including as described above in assessment and plan.

## 2014-06-13 ENCOUNTER — Other Ambulatory Visit: Payer: Self-pay | Admitting: Cardiology

## 2014-06-20 ENCOUNTER — Ambulatory Visit (INDEPENDENT_AMBULATORY_CARE_PROVIDER_SITE_OTHER): Payer: Managed Care, Other (non HMO) | Admitting: Family Medicine

## 2014-06-20 ENCOUNTER — Other Ambulatory Visit (INDEPENDENT_AMBULATORY_CARE_PROVIDER_SITE_OTHER): Payer: Managed Care, Other (non HMO)

## 2014-06-20 ENCOUNTER — Encounter: Payer: Self-pay | Admitting: Family Medicine

## 2014-06-20 VITALS — BP 110/68 | HR 77 | Ht 64.0 in

## 2014-06-20 DIAGNOSIS — M76899 Other specified enthesopathies of unspecified lower limb, excluding foot: Secondary | ICD-10-CM

## 2014-06-20 DIAGNOSIS — M6580 Other synovitis and tenosynovitis, unspecified site: Secondary | ICD-10-CM

## 2014-06-20 NOTE — Patient Instructions (Signed)
Good to see you After exercise. Eat within 30 minutes.  4:1 ratio of carbs to protein.  Chocolate milk with a banana and one scoop of whey protein isolate. 1 gram of protein per day.  Continue the exercises Continue the compression.  See me again in 2-3 weeks.

## 2014-06-20 NOTE — Assessment & Plan Note (Addendum)
Partial is improving at this time. We discussed continuing to monitor closely. Patient showed proper technique of mostly the musculature and the exercises. We also discussed continuing the compression as well as the icing. We discussed which activities will be beneficial. We discussed what activities to avoid. We discussed the possibility of the bursitis and possibly doing an injection if we do not notice any significant improvement in 3 weeks. Patient is going to increase her activity slowly and was given an exercise prescription. In addition of this we are going to increase patient's protein intake. Patient will come back and see me again in 3-4 weeks for further evaluation and treatment. Spent greater than 25 minutes with patient face-to-face and had greater than 50% of counseling including as described above in assessment and plan.

## 2014-06-20 NOTE — Progress Notes (Signed)
  Tawana ScaleZach Jasminemarie Turner D.O. [facility] [address] [place], [place] [address] Phone: [phone] Subjective:     CC: Right buttocks pain follow-up  FAO:ZHYQMVHQIOHPI:Subjective Michele CurbMargaret Turner is a 38 y.o. female coming in with complaint of right buttocks pain. Patient was seen previously and did have a hamstring tendinitis at the origin. There is a questionable tear as well. Patient started with conservative therapy including compression, icing, home exercises, and was given a trial topical anti-inflammatories. Patient states that the pain is improving. Still has some tightness. Patient is having difficulty cut she would like to continue to be active but is finding it difficult because of some discomfort. Patient is also concerned that she may actually be doing worsening arm if she does too much activity but if she does not do enough activity she has increasing anxiety. Patient states that she has not notice any significant weakness. Patient has avoided any high intensity training at this time. Denies new symptoms. Continuing the compression.     Past medical history, social, surgical and family history all reviewed in electronic medical record.   Review of Systems: No headache, visual changes, nausea, vomiting, diarrhea, constipation, dizziness, abdominal pain, skin rash, fevers, chills, night sweats, weight loss, swollen lymph nodes, body aches, joint swelling, muscle aches, chest pain, shortness of breath, mood changes.   Objective Blood pressure 110/68, pulse 77, height 5\' 4"  (1.626 m), weight 0 lb (0 kg), SpO2 98 %.  General: No apparent distress alert and oriented x3 mood and affect normal, dressed appropriately.  HEENT: Pupils equal, extraocular movements intact  Respiratory: Patient's speak in full sentences and does not appear short of breath  Cardiovascular: No lower extremity edema, non tender, no erythema  Skin: Warm dry intact with no signs of infection or rash on extremities or on  axial skeleton.  Abdomen: Soft nontender  Neuro: Cranial nerves II through XII are intact, neurovascularly intact in all extremities with 2+ DTRs and 2+ pulses.  Lymph: No lymphadenopathy of posterior or anterior cervical chain or axillae bilaterally.  Gait normal with good balance and coordination.  MSK:  Non tender with full range of motion and good stability and symmetric strength and tone of shoulders, elbows, wrist,  and ankles bilaterally.  Patient is tender to palpation over the ischial tuberosity. Patient is also mildly tender in the gluteal region. Patient does have mild weakness of the hamstring with 4+ out of 5 strength compared to 5 out of 5 strength on the contralateral side but this is an improvement. Patient hamstring does not have any palpable defect noted. Neurovascularly intact distally.  MSK US performed of: Right This study was ordered, performed, and interpreted by Terrilee FilesZach Shadi Sessler D.O.  Hip: Trochanteric bursa without swelling or effusion. Patient's ischial area does have hypoechoic changes and patient does have what appears to be a near full-thickness tear of the hamstring tendon still present but does have approximately 25% of scar tissue formation noted. Patient also has no signs of a avulsion fracture noted.  IMPRESSION:  Proximal hamstring tear no retraction with healing.      Impression and Recommendations:     This case required medical decision making of moderate complexity.

## 2014-06-21 LAB — POCT INR: INR: 2.4

## 2014-06-22 ENCOUNTER — Ambulatory Visit (INDEPENDENT_AMBULATORY_CARE_PROVIDER_SITE_OTHER): Payer: Managed Care, Other (non HMO) | Admitting: *Deleted

## 2014-06-22 DIAGNOSIS — D6859 Other primary thrombophilia: Secondary | ICD-10-CM

## 2014-06-22 DIAGNOSIS — D6852 Prothrombin gene mutation: Secondary | ICD-10-CM

## 2014-07-07 LAB — POCT INR: INR: 2.4

## 2014-07-11 ENCOUNTER — Ambulatory Visit (INDEPENDENT_AMBULATORY_CARE_PROVIDER_SITE_OTHER): Payer: Managed Care, Other (non HMO) | Admitting: Cardiology

## 2014-07-11 DIAGNOSIS — D6859 Other primary thrombophilia: Secondary | ICD-10-CM

## 2014-07-11 DIAGNOSIS — D6852 Prothrombin gene mutation: Secondary | ICD-10-CM

## 2014-07-11 LAB — POCT INR: INR: 2.2

## 2014-07-26 ENCOUNTER — Ambulatory Visit (INDEPENDENT_AMBULATORY_CARE_PROVIDER_SITE_OTHER): Payer: Managed Care, Other (non HMO) | Admitting: Pharmacist

## 2014-07-26 DIAGNOSIS — D6859 Other primary thrombophilia: Secondary | ICD-10-CM

## 2014-07-26 DIAGNOSIS — D6852 Prothrombin gene mutation: Secondary | ICD-10-CM

## 2014-07-26 LAB — POCT INR: INR: 1.7

## 2014-08-13 LAB — POCT INR: INR: 1.9

## 2014-08-15 ENCOUNTER — Ambulatory Visit (INDEPENDENT_AMBULATORY_CARE_PROVIDER_SITE_OTHER): Payer: Managed Care, Other (non HMO) | Admitting: Internal Medicine

## 2014-08-15 DIAGNOSIS — D6852 Prothrombin gene mutation: Secondary | ICD-10-CM

## 2014-08-15 DIAGNOSIS — D6859 Other primary thrombophilia: Secondary | ICD-10-CM

## 2014-08-26 ENCOUNTER — Encounter: Payer: Self-pay | Admitting: Nurse Practitioner

## 2014-08-26 ENCOUNTER — Ambulatory Visit (INDEPENDENT_AMBULATORY_CARE_PROVIDER_SITE_OTHER): Payer: Managed Care, Other (non HMO) | Admitting: Nurse Practitioner

## 2014-08-26 VITALS — BP 120/82 | HR 76 | Ht 64.0 in | Wt 126.0 lb

## 2014-08-26 DIAGNOSIS — Z0000 Encounter for general adult medical examination without abnormal findings: Secondary | ICD-10-CM

## 2014-08-26 DIAGNOSIS — Z01419 Encounter for gynecological examination (general) (routine) without abnormal findings: Secondary | ICD-10-CM

## 2014-08-26 LAB — POCT URINALYSIS DIPSTICK
Bilirubin, UA: NEGATIVE
GLUCOSE UA: NEGATIVE
Ketones, UA: NEGATIVE
Leukocytes, UA: NEGATIVE
Nitrite, UA: NEGATIVE
Protein, UA: NEGATIVE
RBC UA: NEGATIVE
Urobilinogen, UA: NEGATIVE
pH, UA: 6

## 2014-08-26 NOTE — Progress Notes (Signed)
Patient ID: Michele Turner, female   DOB: [birth year], 39 y.o.   MRN: [id number] 39 y.o. G0P0 Married  Caucasian Fe here for annual exam.    Patient's last menstrual period was 08/12/2014.          Sexually active: Yes.    The current method of family planning is IUD.   Mirena inserted 08/19/13. Exercising: Yes.    aerobics and weights Smoker:  no  Health Maintenance: Pap:  07/23/11, negative with neg HR HPV Colonoscopy:  11/03/08, normal, repeat at age 84 TDaP:  02/19/14 Labs:  HB:  PCP (06/2014)  Urine: negative   reports that she has never smoked. She has never used smokeless tobacco. She reports that she drinks alcohol. She reports that she does not use illicit drugs.  Past Medical History  Diagnosis Date  . GERD (gastroesophageal reflux disease)   . Hiatal hernia   . DVT (deep venous thrombosis) 12/26/08    on OCP - YASMIN  . Anxiety     Past Surgical History  Procedure Laterality Date  . Breast enhancement surgery    . Intrauterine device insertion  02/02/09, 08/19/13    Mirena    Current Outpatient Prescriptions  Medication Sig Dispense Refill  . ALPRAZolam (XANAX) 0.25 MG tablet Take 1 tablet (0.25 mg total) by mouth 3 (three) times daily as needed. 30 tablet 0  . amphetamine-dextroamphetamine (ADDERALL) 10 MG tablet Take 1 tablet by mouth daily as needed.    [name] BACTROBAN 2 % ointment     . buPROPion (WELLBUTRIN) 75 MG tablet Take 75 mg by mouth 2 (two) times daily.  5  . COUMADIN 5 MG tablet Take as directed by coumadin clinic 90 tablet 3  . diazepam (VALIUM) 5 MG tablet Take 1 tablet (5 mg total) by mouth every 12 (twelve) hours as needed for anxiety. 30 tablet 1  . escitalopram (LEXAPRO) 10 MG tablet Take 1 tablet (10 mg total) by mouth daily. 90 tablet 3  . famotidine (PEPCID) 20 MG tablet Take 1 tablet (20 mg total) by mouth 2 (two) times daily. 30 tablet 1  . ibuprofen (ADVIL,MOTRIN) 800 MG tablet Take 1 tablet (800 mg total) by mouth every 8 (eight) hours as needed  for pain. 30 tablet 3  . levonorgestrel (MIRENA) 20 MCG/24HR IUD 1 each by Intrauterine route once.      . lisdexamfetamine (VYVANSE) 70 MG capsule Take 1 capsule (70 mg total) by mouth daily. 30 capsule 0  . omeprazole-sodium bicarbonate (ZEGERID) 40-1100 MG per capsule Take 1 capsule by mouth daily.      [name] URELLE (URELLE/URISED) 81 MG TABS Take 1 tablet (81 mg total) by mouth as directed. 120 each 5  . VALTREX 1 G tablet TAKE AS DIRECTED. 30 tablet 11  . VERAMYST 27.5 MCG/SPRAY nasal spray USE 2 SPRAYS INTO THE NOSE DAILY. 10 g 5   No current facility-administered medications for this visit.    Family History  Problem Relation Age of Onset  . Asthma    . Hypertension    . Hyperlipidemia    . Breast cancer    . Colon cancer Paternal Grandmother     and paternal great grandfather  . Hypertension Mother   . Hyperlipidemia Mother   . Heart disease Mother   . Asthma Mother   . Pulmonary embolism Mother   . Osteopenia Mother   . Cancer Father     prostate  . Heart Problems Maternal Grandmother     born with  hole in heart, now has pacemaker  . Osteoporosis Maternal Grandmother   . Diabetes Maternal Grandfather     ROS:  Pertinent items are noted in HPI.  Otherwise, a comprehensive ROS was negative.  Exam:   BP 120/82 mmHg  Pulse 76  Ht 5\' 4"  (1.626 m)  Wt 126 lb (57.153 kg)  BMI 21.62 kg/m2  LMP 08/12/2014 Height: 5\' 4"  (162.6 cm) Ht Readings from Last 3 Encounters:  08/26/14 5\' 4"  (1.626 m)  06/20/14 5\' 4"  (1.626 m)  06/08/14 5\' 4"  (1.626 m)    General appearance: alert, cooperative and appears stated age Head: Normocephalic, without obvious abnormality, atraumatic Neck: no adenopathy, supple, symmetrical, trachea midline and thyroid normal to inspection and palpation Lungs: clear to auscultation bilaterally Breasts: normal appearance, no masses or tenderness, positive findings: implants bilaterally Heart: regular rate and rhythm Abdomen: soft, non-tender; no masses,   no organomegaly Extremities: extremities normal, atraumatic, no cyanosis or edema Skin: Skin color, texture, turgor normal. No rashes or lesions Lymph nodes: Cervical, supraclavicular, and axillary nodes normal. No abnormal inguinal nodes palpated Neurologic: Grossly normal   Pelvic: External genitalia:  no lesions              Urethra:  normal appearing urethra with no masses, tenderness or lesions              Bartholin's and Skene's: normal                 Vagina: normal appearing vagina with normal color and discharge, no lesions              Cervix: anteverted IUD string is visible              Pap taken: Yes.   Bimanual Exam:  Uterus:  normal size, contour, position, consistency, mobility, non-tender              Adnexa: no mass, fullness, tenderness               Rectovaginal: Confirms               Anus:  normal sphincter tone, no lesions  Chaperone present:  yes  A:  Well Woman with normal exam  Mirena IUD for contraception - new one 08/19/13 History of DVT On OCP (Yasmin) - on Coumadin  P:   Reviewed health and wellness pertinent to exam  Pap smear taken today  Counseled on breast self exam, adequate intake of calcium and vitamin D, diet and exercise return annually or prn  An After Visit Summary was printed and given to the patient.

## 2014-08-26 NOTE — Patient Instructions (Signed)

## 2014-08-30 LAB — IPS PAP TEST WITH HPV

## 2014-08-30 NOTE — Progress Notes (Signed)
Encounter reviewed by Dr. Brook Silva.  

## 2014-09-02 ENCOUNTER — Ambulatory Visit (INDEPENDENT_AMBULATORY_CARE_PROVIDER_SITE_OTHER): Payer: Managed Care, Other (non HMO) | Admitting: Cardiology

## 2014-09-02 DIAGNOSIS — D6859 Other primary thrombophilia: Secondary | ICD-10-CM

## 2014-09-02 DIAGNOSIS — D6852 Prothrombin gene mutation: Secondary | ICD-10-CM

## 2014-09-02 LAB — POCT INR: INR: 1.7

## 2014-09-13 ENCOUNTER — Ambulatory Visit (INDEPENDENT_AMBULATORY_CARE_PROVIDER_SITE_OTHER): Payer: Managed Care, Other (non HMO) | Admitting: Cardiovascular Disease

## 2014-09-13 DIAGNOSIS — D6859 Other primary thrombophilia: Secondary | ICD-10-CM

## 2014-09-13 DIAGNOSIS — D6852 Prothrombin gene mutation: Secondary | ICD-10-CM

## 2014-09-13 LAB — POCT INR: INR: 2.2

## 2014-09-16 LAB — PROTIME-INR

## 2014-09-19 ENCOUNTER — Encounter: Payer: Self-pay | Admitting: Cardiovascular Disease

## 2014-09-29 LAB — POCT INR: INR: 3.7

## 2014-09-30 ENCOUNTER — Ambulatory Visit (INDEPENDENT_AMBULATORY_CARE_PROVIDER_SITE_OTHER): Payer: Managed Care, Other (non HMO) | Admitting: Cardiology

## 2014-09-30 DIAGNOSIS — D6859 Other primary thrombophilia: Secondary | ICD-10-CM

## 2014-09-30 DIAGNOSIS — Z5181 Encounter for therapeutic drug level monitoring: Secondary | ICD-10-CM

## 2014-09-30 DIAGNOSIS — D6852 Prothrombin gene mutation: Secondary | ICD-10-CM

## 2014-10-11 ENCOUNTER — Ambulatory Visit (INDEPENDENT_AMBULATORY_CARE_PROVIDER_SITE_OTHER): Payer: Managed Care, Other (non HMO) | Admitting: *Deleted

## 2014-10-11 ENCOUNTER — Other Ambulatory Visit: Payer: Self-pay | Admitting: Family Medicine

## 2014-10-11 DIAGNOSIS — Z111 Encounter for screening for respiratory tuberculosis: Secondary | ICD-10-CM | POA: Diagnosis not present

## 2014-10-11 NOTE — Progress Notes (Signed)
Pre visit review using our clinic review tool, if applicable. No additional management support is needed unless otherwise documented below in the visit note.  Patient tolerated well.  Notified when to return for PPD reading.

## 2014-10-13 ENCOUNTER — Telehealth: Payer: Self-pay | Admitting: *Deleted

## 2014-10-13 LAB — TB SKIN TEST
Induration: 0 mm
TB Skin Test: NEGATIVE

## 2014-10-13 NOTE — Telephone Encounter (Signed)
PPD test resulted.  Test was negative.  Letter printed for patient.

## 2014-10-19 ENCOUNTER — Ambulatory Visit (INDEPENDENT_AMBULATORY_CARE_PROVIDER_SITE_OTHER): Payer: Managed Care, Other (non HMO) | Admitting: Cardiology

## 2014-10-19 DIAGNOSIS — Z5181 Encounter for therapeutic drug level monitoring: Secondary | ICD-10-CM

## 2014-10-19 DIAGNOSIS — D6852 Prothrombin gene mutation: Secondary | ICD-10-CM

## 2014-10-19 DIAGNOSIS — D6859 Other primary thrombophilia: Secondary | ICD-10-CM

## 2014-10-19 LAB — POCT INR: INR: 3

## 2014-10-24 ENCOUNTER — Ambulatory Visit (INDEPENDENT_AMBULATORY_CARE_PROVIDER_SITE_OTHER): Payer: Managed Care, Other (non HMO) | Admitting: Family Medicine

## 2014-10-24 ENCOUNTER — Encounter: Payer: Self-pay | Admitting: Family Medicine

## 2014-10-24 ENCOUNTER — Other Ambulatory Visit (INDEPENDENT_AMBULATORY_CARE_PROVIDER_SITE_OTHER): Payer: Managed Care, Other (non HMO)

## 2014-10-24 ENCOUNTER — Encounter: Payer: Self-pay | Admitting: *Deleted

## 2014-10-24 VITALS — BP 128/78 | HR 70 | Ht 64.0 in | Wt 126.0 lb

## 2014-10-24 DIAGNOSIS — M76899 Other specified enthesopathies of unspecified lower limb, excluding foot: Secondary | ICD-10-CM

## 2014-10-24 DIAGNOSIS — M6580 Other synovitis and tenosynovitis, unspecified site: Secondary | ICD-10-CM

## 2014-10-24 DIAGNOSIS — G5701 Lesion of sciatic nerve, right lower limb: Secondary | ICD-10-CM | POA: Diagnosis not present

## 2014-10-24 NOTE — Assessment & Plan Note (Signed)
Patient is more of a piriformis syndrome. We discussed the importance of hip abductor strengthening. Patient did work with Event organiserathletic trainer today.  Piriformis Syndrome  Using an anatomical model, reviewed with the patient the structures involved and how they related to diagnosis. The patient indicated understanding.   The patient was given a handout from Dr. Ailene Ardsouzier's book "The Sports Medicine Patient Advisor" describing the anatomy and rehabilitation of the following condition: Piriformis Syndrome  Also given a handout with more extensive Piriformis stretching, hip flexor and abductor strengthening, ham stretching  Rec deep massage, explained self-massage with ball Patient will return in 3 weeks for further evaluation and treatment.

## 2014-10-24 NOTE — Progress Notes (Signed)
Pre visit review using our clinic review tool, if applicable. No additional management support is needed unless otherwise documented below in the visit note. 

## 2014-10-24 NOTE — Progress Notes (Signed)
Michele Turner D.O. [facility] [address] [place], [place] [address] Phone: [phone] Subjective:     CC: Right buttocks pain follow-up  WGN:[id number]:Subjective [name] is a 39 y.o. female coming in with complaint of right buttocks pain. Patient was seen previously and did have a hamstring tendinitis at the origin. There is a questionable tear as well. Patient started with conservative therapy including compression, icing, home exercises, and was given a trial topical anti-inflammatories. Patient states that the pain is improving. Still has some tightness. Patient was seen greater than 4 months ago. Patient states that now it seems to be more related to her buttocks pain and is giving her difficulty even with sitting. Patient states that she has to stand more of the time. Notices different shoes seem to make it worse. Denies any weakness of the leg but does have radiation down the leg. Denies any back pain associated with it. Still able to do daily activities. [place] did [place] and had to stand on the plain most of the trip secondary to the pain.     Past medical history, social, surgical and family history all reviewed in electronic medical record.   Review of Systems: No headache, visual changes, nausea, vomiting, diarrhea, constipation, dizziness, abdominal pain, skin rash, fevers, chills, night sweats, weight loss, swollen lymph nodes, body aches, joint swelling, muscle aches, chest pain, shortness of breath, mood changes.   Objective Blood pressure 128/78, pulse 70, height 5\' 4"  (1.626 m), weight 126 lb (57.153 kg), SpO2 99 %.  General: No apparent distress alert and oriented x3 mood and affect normal, dressed appropriately.  HEENT: Pupils equal, extraocular movements intact  Respiratory: Patient's speak in full sentences and does not appear short of breath  Cardiovascular: No lower extremity edema, non tender, no erythema  Skin: Warm dry intact with no signs  of infection or rash on extremities or on axial skeleton.  Abdomen: Soft nontender  Neuro: Cranial nerves II through XII are intact, neurovascularly intact in all extremities with 2+ DTRs and 2+ pulses.  Lymph: No lymphadenopathy of posterior or anterior cervical chain or axillae bilaterally.  Gait normal with good balance and coordination.  MSK:  Non tender with full range of motion and good stability and symmetric strength and tone of shoulders, elbows, wrist,  and ankles bilaterally.  Patient is tender over the piriformis muscle on the right side. Less tender over the hamstring with 5 out of 5 strength compared to the contralateral side. Patient does have a positive Faber test severe tenderness over the piriformis muscle compared to contralateral side. Negative straight leg test neurovascularly intact distally.  MSK US performed of: Right This study was ordered, performed, and interpreted by Terrilee FilesZach Turner D.O.  Hip: Trochanteric bursa without swelling or effusion. Area where patient hamstring tear was previously has some mild scar tissue formation. Piriformis muscle does have area approximately 3 cm from midline and 2 cm distal showing mild hypoechoic changes and increasing diameter of the sciatic nerve.  IMPRESSION:  Small hamstring tear resolved with some mild chronic thickening of the sciatic nerve in the piriformis.   Procedure note 97110; 15 minutes spent for Therapeutic exercises as stated in above notes.  This included exercises focusing on stretching, strengthening, with significant focus on eccentric aspects.  Hip strengthening exercises which included:  Pelvic tilt/bracing to help with proper recruitment of the lower abs and pelvic floor muscles  Glute strengthening to properly contract glutes without over-engaging low back and hamstrings -  prone hip extension and glute bridge exercises Proper stretching techniques to increase effectiveness for the hip flexors, groin, quads, piriformis  and low back when appropriate  Proper technique shown and discussed handout in great detail with ATC.  All questions were discussed and answered.     Impression and Recommendations:     This case required medical decision making of moderate complexity.

## 2014-10-24 NOTE — Patient Instructions (Signed)
Good to see you Tennis ball in back right pocket with sitting.  Ice after activity for about 10-20 minutes Heels are worse for now.  Exercises on wall.  Heel and butt touching.  Raise leg 6 inches and hold 2 seconds.  Down slow for count of 4 seconds.  1 set of 30 reps daily on both sides.  Other exercises 3 times a week See me again in 3 weeks.

## 2014-11-02 ENCOUNTER — Ambulatory Visit (INDEPENDENT_AMBULATORY_CARE_PROVIDER_SITE_OTHER): Payer: Managed Care, Other (non HMO) | Admitting: Cardiovascular Disease

## 2014-11-02 DIAGNOSIS — D6859 Other primary thrombophilia: Secondary | ICD-10-CM

## 2014-11-02 DIAGNOSIS — D6852 Prothrombin gene mutation: Secondary | ICD-10-CM

## 2014-11-02 DIAGNOSIS — Z5181 Encounter for therapeutic drug level monitoring: Secondary | ICD-10-CM

## 2014-11-02 LAB — POCT INR: INR: 1.8

## 2014-11-03 ENCOUNTER — Ambulatory Visit (INDEPENDENT_AMBULATORY_CARE_PROVIDER_SITE_OTHER): Payer: Managed Care, Other (non HMO) | Admitting: Psychology

## 2014-11-03 DIAGNOSIS — F42 Obsessive-compulsive disorder: Secondary | ICD-10-CM

## 2014-11-18 LAB — POCT INR: INR: 2.6

## 2014-11-21 ENCOUNTER — Ambulatory Visit (INDEPENDENT_AMBULATORY_CARE_PROVIDER_SITE_OTHER): Payer: Managed Care, Other (non HMO) | Admitting: Cardiovascular Disease

## 2014-11-21 DIAGNOSIS — D6859 Other primary thrombophilia: Secondary | ICD-10-CM

## 2014-11-21 DIAGNOSIS — Z5181 Encounter for therapeutic drug level monitoring: Secondary | ICD-10-CM

## 2014-11-21 DIAGNOSIS — D6852 Prothrombin gene mutation: Secondary | ICD-10-CM

## 2014-11-23 ENCOUNTER — Other Ambulatory Visit: Payer: Self-pay | Admitting: Cardiology

## 2014-12-06 LAB — POCT INR: INR: 2

## 2014-12-07 ENCOUNTER — Ambulatory Visit (INDEPENDENT_AMBULATORY_CARE_PROVIDER_SITE_OTHER): Payer: Managed Care, Other (non HMO) | Admitting: Cardiology

## 2014-12-07 DIAGNOSIS — D6859 Other primary thrombophilia: Secondary | ICD-10-CM

## 2014-12-07 DIAGNOSIS — D6852 Prothrombin gene mutation: Secondary | ICD-10-CM

## 2014-12-07 DIAGNOSIS — Z5181 Encounter for therapeutic drug level monitoring: Secondary | ICD-10-CM

## 2014-12-24 LAB — POCT INR: INR: 2

## 2014-12-26 ENCOUNTER — Ambulatory Visit (INDEPENDENT_AMBULATORY_CARE_PROVIDER_SITE_OTHER): Payer: Managed Care, Other (non HMO) | Admitting: Cardiovascular Disease

## 2014-12-26 DIAGNOSIS — D6859 Other primary thrombophilia: Secondary | ICD-10-CM

## 2014-12-26 DIAGNOSIS — Z5181 Encounter for therapeutic drug level monitoring: Secondary | ICD-10-CM

## 2014-12-26 DIAGNOSIS — D6852 Prothrombin gene mutation: Secondary | ICD-10-CM

## 2014-12-28 ENCOUNTER — Encounter: Payer: Self-pay | Admitting: Family Medicine

## 2014-12-28 ENCOUNTER — Telehealth: Payer: Self-pay | Admitting: Family Medicine

## 2014-12-28 ENCOUNTER — Ambulatory Visit (INDEPENDENT_AMBULATORY_CARE_PROVIDER_SITE_OTHER): Payer: Managed Care, Other (non HMO) | Admitting: Family Medicine

## 2014-12-28 ENCOUNTER — Ambulatory Visit (INDEPENDENT_AMBULATORY_CARE_PROVIDER_SITE_OTHER)
Admission: RE | Admit: 2014-12-28 | Discharge: 2014-12-28 | Disposition: A | Discharge status: Home / Self Care | Payer: Managed Care, Other (non HMO) | Source: Ambulatory Visit | Attending: Family Medicine | Admitting: Family Medicine

## 2014-12-28 VITALS — BP 122/78 | HR 65 | Ht 64.0 in | Wt 126.0 lb

## 2014-12-28 DIAGNOSIS — M9903 Segmental and somatic dysfunction of lumbar region: Secondary | ICD-10-CM | POA: Diagnosis not present

## 2014-12-28 DIAGNOSIS — S134XXA Sprain of ligaments of cervical spine, initial encounter: Secondary | ICD-10-CM | POA: Insufficient documentation

## 2014-12-28 DIAGNOSIS — M9901 Segmental and somatic dysfunction of cervical region: Secondary | ICD-10-CM

## 2014-12-28 DIAGNOSIS — M542 Cervicalgia: Secondary | ICD-10-CM

## 2014-12-28 DIAGNOSIS — M9902 Segmental and somatic dysfunction of thoracic region: Secondary | ICD-10-CM | POA: Diagnosis not present

## 2014-12-28 DIAGNOSIS — M999 Biomechanical lesion, unspecified: Secondary | ICD-10-CM

## 2014-12-28 MED ORDER — CYCLOBENZAPRINE HCL 10 MG PO TABS: 10.0000 mg | ORAL_TABLET | Freq: Three times a day (TID) | ORAL | Status: DC | PRN

## 2014-12-28 NOTE — Telephone Encounter (Signed)
What do you think of 815 tomorrow?

## 2014-12-28 NOTE — Progress Notes (Signed)
Pre visit review using our clinic review tool, if applicable. No additional management support is needed unless otherwise documented below in the visit note. 
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
## 2014-12-28 NOTE — Patient Instructions (Addendum)
Good to see you Ice is your friend Ice 20 minutes 2 times daily. Usually after activity and before bed. Duexis 3 times daily for 3 days Flexeril at night Xray downstairs No workout today and ok to workout tomorrow at 50% and increase 25% a day See me again in 7-10 days
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
## 2014-12-28 NOTE — Telephone Encounter (Signed)
Do you have a time preference?

## 2014-12-28 NOTE — Assessment & Plan Note (Signed)
Decision today to treat with OMT was based on Physical Exam  After verbal consent patient was treated with   ME techniques in cervical, thoracic and lumbar areas Patient tolerated the procedure well with improvement in symptoms  Patient given exercises, stretches and lifestyle modifications including work positioning  See medications in patient instructions if given  Patient will follow up in 1 weeks

## 2014-12-28 NOTE — Telephone Encounter (Signed)
Pt is coming in tomorroat 8:15a.

## 2014-12-29 ENCOUNTER — Ambulatory Visit: Payer: Self-pay | Admitting: Family Medicine

## 2015-01-10 ENCOUNTER — Ambulatory Visit (INDEPENDENT_AMBULATORY_CARE_PROVIDER_SITE_OTHER): Payer: Managed Care, Other (non HMO) | Admitting: Family Medicine

## 2015-01-10 ENCOUNTER — Encounter: Payer: Self-pay | Admitting: Family Medicine

## 2015-01-10 VITALS — BP 122/80 | HR 84 | Ht 64.0 in | Wt 126.0 lb

## 2015-01-10 DIAGNOSIS — S134XXD Sprain of ligaments of cervical spine, subsequent encounter: Secondary | ICD-10-CM | POA: Diagnosis not present

## 2015-01-10 DIAGNOSIS — M9901 Segmental and somatic dysfunction of cervical region: Secondary | ICD-10-CM

## 2015-01-10 DIAGNOSIS — M9903 Segmental and somatic dysfunction of lumbar region: Secondary | ICD-10-CM | POA: Diagnosis not present

## 2015-01-10 DIAGNOSIS — M9902 Segmental and somatic dysfunction of thoracic region: Secondary | ICD-10-CM

## 2015-01-10 DIAGNOSIS — M999 Biomechanical lesion, unspecified: Secondary | ICD-10-CM

## 2015-01-10 NOTE — Assessment & Plan Note (Signed)
Decision today to treat with OMT was based on Physical Exam  After verbal consent patient was treated with   ME techniques in cervical, thoracic and lumbar areas Patient tolerated the procedure well with improvement in symptoms  Patient given exercises, stretches and lifestyle modifications including work positioning  See medications in patient instructions if given  Patient will follow up in 3-4 weeks

## 2015-01-10 NOTE — Assessment & Plan Note (Signed)
Has responded very well to conservative therapy at this time. Patient also did very well with manipulation. Encourage her to use the muscle relaxer at night. I do think there is some underlying osteoarthritic changes of the neck that could also contributed. We will this over the course of time. Patient's will come back and see me again in 3-4 weeks for further evaluation and treatment.
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
## 2015-01-10 NOTE — Progress Notes (Signed)
Pre visit review using our clinic review tool, if applicable. No additional management support is needed unless otherwise documented below in the visit note. 

## 2015-01-10 NOTE — Patient Instructions (Addendum)
Good to see you Much better overall At night consider the muscle relaxer Go pillow shopping ICe is still good  Use a tennis ball on your spot See me again in 2-3 weeks.

## 2015-01-15 LAB — POCT INR: INR: 2.8

## 2015-01-16 ENCOUNTER — Ambulatory Visit (INDEPENDENT_AMBULATORY_CARE_PROVIDER_SITE_OTHER): Payer: Managed Care, Other (non HMO) | Admitting: Internal Medicine

## 2015-01-16 DIAGNOSIS — D6859 Other primary thrombophilia: Secondary | ICD-10-CM

## 2015-01-16 DIAGNOSIS — Z5181 Encounter for therapeutic drug level monitoring: Secondary | ICD-10-CM

## 2015-01-16 DIAGNOSIS — D6852 Prothrombin gene mutation: Secondary | ICD-10-CM

## 2015-01-31 ENCOUNTER — Ambulatory Visit (INDEPENDENT_AMBULATORY_CARE_PROVIDER_SITE_OTHER): Payer: Managed Care, Other (non HMO) | Admitting: Family Medicine

## 2015-01-31 ENCOUNTER — Encounter: Payer: Self-pay | Admitting: Family Medicine

## 2015-01-31 VITALS — BP 116/62 | HR 67 | Ht 64.0 in | Wt 126.0 lb

## 2015-01-31 DIAGNOSIS — M999 Biomechanical lesion, unspecified: Secondary | ICD-10-CM

## 2015-01-31 DIAGNOSIS — M9901 Segmental and somatic dysfunction of cervical region: Secondary | ICD-10-CM | POA: Diagnosis not present

## 2015-01-31 DIAGNOSIS — M9903 Segmental and somatic dysfunction of lumbar region: Secondary | ICD-10-CM

## 2015-01-31 DIAGNOSIS — M542 Cervicalgia: Secondary | ICD-10-CM

## 2015-01-31 DIAGNOSIS — M9902 Segmental and somatic dysfunction of thoracic region: Secondary | ICD-10-CM | POA: Diagnosis not present

## 2015-01-31 DIAGNOSIS — G5701 Lesion of sciatic nerve, right lower limb: Secondary | ICD-10-CM

## 2015-01-31 NOTE — Patient Instructions (Addendum)
Good to see you Ice will always be good.  Thanks for the tickets to the gun show Consider injection in the butt but bring someone with you See me again in 4-5 weeks.

## 2015-01-31 NOTE — Assessment & Plan Note (Signed)
Overall doing much better. Discussed icing regimen and home exercises. We discussed which activities to avoid and which ones can be beneficial. Patient come back and see me again in 4-6 weeks for further evaluation and treatment.

## 2015-01-31 NOTE — Assessment & Plan Note (Signed)
Decision today to treat with OMT was based on Physical Exam  After verbal consent patient was treated with   ME techniques in cervical, thoracic and lumbar areas Patient tolerated the procedure well with improvement in symptoms  Patient given exercises, stretches and lifestyle modifications including work positioning  See medications in patient instructions if given  Patient will follow up in 4-6 weeks            

## 2015-01-31 NOTE — Assessment & Plan Note (Signed)
Continued to give patient trouble we may need to consider injection. Patient will call if she would like this done and she will bring a driver.
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
## 2015-01-31 NOTE — Progress Notes (Signed)
Pre visit review using our clinic review tool, if applicable. No additional management support is needed unless otherwise documented below in the visit note. 

## 2015-02-03 LAB — POCT INR: INR: 3.9

## 2015-02-06 ENCOUNTER — Ambulatory Visit (INDEPENDENT_AMBULATORY_CARE_PROVIDER_SITE_OTHER): Payer: Managed Care, Other (non HMO) | Admitting: Cardiovascular Disease

## 2015-02-06 DIAGNOSIS — D6852 Prothrombin gene mutation: Secondary | ICD-10-CM

## 2015-02-06 DIAGNOSIS — D6859 Other primary thrombophilia: Secondary | ICD-10-CM

## 2015-02-06 DIAGNOSIS — Z5181 Encounter for therapeutic drug level monitoring: Secondary | ICD-10-CM

## 2015-02-15 ENCOUNTER — Other Ambulatory Visit: Payer: Self-pay | Admitting: Family Medicine

## 2015-02-23 ENCOUNTER — Ambulatory Visit (INDEPENDENT_AMBULATORY_CARE_PROVIDER_SITE_OTHER): Payer: Managed Care, Other (non HMO) | Admitting: *Deleted

## 2015-02-23 DIAGNOSIS — D6859 Other primary thrombophilia: Secondary | ICD-10-CM

## 2015-02-23 DIAGNOSIS — Z5181 Encounter for therapeutic drug level monitoring: Secondary | ICD-10-CM

## 2015-02-23 DIAGNOSIS — D6852 Prothrombin gene mutation: Secondary | ICD-10-CM

## 2015-02-23 LAB — POCT INR: INR: 2.7

## 2015-03-04 LAB — POCT INR: INR: 2.8

## 2015-03-08 ENCOUNTER — Ambulatory Visit (INDEPENDENT_AMBULATORY_CARE_PROVIDER_SITE_OTHER): Payer: Managed Care, Other (non HMO) | Admitting: Internal Medicine

## 2015-03-08 DIAGNOSIS — D6859 Other primary thrombophilia: Secondary | ICD-10-CM

## 2015-03-08 DIAGNOSIS — D6852 Prothrombin gene mutation: Secondary | ICD-10-CM

## 2015-03-08 DIAGNOSIS — Z5181 Encounter for therapeutic drug level monitoring: Secondary | ICD-10-CM

## 2015-03-18 LAB — POCT INR: INR: 2.7

## 2015-03-20 ENCOUNTER — Ambulatory Visit (INDEPENDENT_AMBULATORY_CARE_PROVIDER_SITE_OTHER): Payer: Managed Care, Other (non HMO) | Admitting: Cardiovascular Disease

## 2015-03-20 DIAGNOSIS — D6859 Other primary thrombophilia: Secondary | ICD-10-CM

## 2015-03-20 DIAGNOSIS — D6852 Prothrombin gene mutation: Secondary | ICD-10-CM

## 2015-03-20 DIAGNOSIS — Z5181 Encounter for therapeutic drug level monitoring: Secondary | ICD-10-CM

## 2015-04-04 ENCOUNTER — Ambulatory Visit (INDEPENDENT_AMBULATORY_CARE_PROVIDER_SITE_OTHER): Payer: Managed Care, Other (non HMO) | Admitting: Cardiovascular Disease

## 2015-04-04 DIAGNOSIS — D6859 Other primary thrombophilia: Secondary | ICD-10-CM

## 2015-04-04 DIAGNOSIS — D6852 Prothrombin gene mutation: Secondary | ICD-10-CM

## 2015-04-04 DIAGNOSIS — Z5181 Encounter for therapeutic drug level monitoring: Secondary | ICD-10-CM

## 2015-04-04 LAB — POCT INR: INR: 2.1

## 2015-04-19 ENCOUNTER — Ambulatory Visit (INDEPENDENT_AMBULATORY_CARE_PROVIDER_SITE_OTHER): Payer: Managed Care, Other (non HMO) | Admitting: Internal Medicine

## 2015-04-19 ENCOUNTER — Telehealth: Payer: Self-pay | Admitting: Family Medicine

## 2015-04-19 DIAGNOSIS — Z5181 Encounter for therapeutic drug level monitoring: Secondary | ICD-10-CM

## 2015-04-19 DIAGNOSIS — D6859 Other primary thrombophilia: Secondary | ICD-10-CM

## 2015-04-19 LAB — POCT INR: INR: 3

## 2015-04-19 NOTE — Telephone Encounter (Signed)
Patient has a new injury (she believes its something to do with her hip) and is having trouble walking.  She is wanting to know if she can come in today.

## 2015-04-19 NOTE — Telephone Encounter (Signed)
Spoke to pt, scheduled her for 830a tomorrow.

## 2015-04-20 ENCOUNTER — Other Ambulatory Visit: Payer: Self-pay | Admitting: Cardiology

## 2015-04-20 ENCOUNTER — Other Ambulatory Visit (INDEPENDENT_AMBULATORY_CARE_PROVIDER_SITE_OTHER): Payer: Managed Care, Other (non HMO)

## 2015-04-20 ENCOUNTER — Encounter: Payer: Self-pay | Admitting: Family Medicine

## 2015-04-20 ENCOUNTER — Ambulatory Visit (INDEPENDENT_AMBULATORY_CARE_PROVIDER_SITE_OTHER): Payer: Managed Care, Other (non HMO) | Admitting: Family Medicine

## 2015-04-20 VITALS — BP 126/80 | HR 68 | Ht 64.0 in | Wt 126.0 lb

## 2015-04-20 DIAGNOSIS — M79604 Pain in right leg: Secondary | ICD-10-CM | POA: Diagnosis not present

## 2015-04-20 DIAGNOSIS — S76191A Other specified injury of right quadriceps muscle, fascia and tendon, initial encounter: Secondary | ICD-10-CM | POA: Insufficient documentation

## 2015-04-20 MED ORDER — NITROGLYCERIN 0.2 MG/HR TD PT24: MEDICATED_PATCH | TRANSDERMAL | Status: DC

## 2015-04-20 NOTE — Telephone Encounter (Signed)
Requesting refill

## 2015-04-20 NOTE — Assessment & Plan Note (Signed)
Patient does have a tear of the proximal quad some tendon. This is right at the area aiis. This is showing some calcific changes on the ultrasound today. We discussed compression, icing, as well as patient is going to try the nitroglycerin patient patches. Patient will avoid any significant high intensity impact at this time. Patient and will come back and see me again in 2-3 weeks for further evaluation and treatment.

## 2015-04-20 NOTE — Patient Instructions (Addendum)
Good to see you Compression shorts Nitroglycerin Protocol   Apply 1/4 nitroglycerin patch to affected area daily.  Change position of patch within the affected area every 24 hours.  You may experience a headache during the first 1-2 weeks of using the patch, these should subside.  If you experience headaches after beginning nitroglycerin patch treatment, you may take your preferred over the counter pain reliever.  Another side effect of the nitroglycerin patch is skin irritation or rash related to patch adhesive.  Please notify our office if you develop more severe headaches or rash, and stop the patch.  Tendon healing with nitroglycerin patch may require 12 to 24 weeks depending on the extent of injury.  Men should not use if taking Viagra, Cialis, or Levitra.   Do not use if you have migraines or rosacea.   Vitamin D 4000 IU daily Ice 20 minutes 2 times daily. Usually after activity and before bed. See me again in 3 weeks.

## 2015-04-20 NOTE — Progress Notes (Signed)
Pre visit review using our clinic review tool, if applicable. No additional management support is needed unless otherwise documented below in the visit note. 

## 2015-04-20 NOTE — Progress Notes (Signed)
  Subjective:    CC:  Right leg pain  HPI: Right leg pain. Patient was working out and felt pain in right hip and anterior leg with HIIT exercise. Ice did help.  No radiation . Patient states initially she was unable to ambulate. States that today it seems to be little bit better. Did take an anti-inflammatory. Patient though does sound that it is long-term second very to patient being on anticoagulation. Patient is concerned as is another injury to the same hip.  States that it seems to be getting somewhat better but still 7 out of 10 especially even to palpation. Worse with going up or downstairs.  Past medical history, Surgical history, Family history not pertinant except as noted below, Social history, Allergies, and medications have been entered into the medical record, reviewed, and no changes needed.   Review of Systems: No fevers, chills, night sweats, weight loss, chest pain, or shortness of breath.   Objective:   Blood pressure 126/80, pulse 68, height 5\' 4"  (1.626 m), weight 126 lb (57.153 kg), SpO2 98 %.  General: Well Developed, well nourished, and in no acute distress.  Neuro: Alert and oriented x3, extra-ocular muscles intact, sensation grossly intact.  HEENT: Normocephalic, atraumatic, pupils equal round reactive to light, neck supple, no masses, no lymphadenopathy, thyroid nonpalpable.  Skin: Warm and dry, no rashes. Cardiac:  no lower extremity edema. Respiratory: Not using accessory muscles, speaking in full sentences. Abdominal: NT, soft Gait: Nonantlagic, good balance and coordination Lymphatic: no lymphadenopathy in neck or axillae on palpation, non tender.  Musculoskeletal: Inspection and palpation of the right and left upper extremities including the shoulders elbows and wrist are unremarkable with full range of motion and good muscle strength and tone. Inspection and palpation of the right and left lower extremities including the hips knees and ankles are  unremarkable and nontender with full range of motion and good muscle strength and tone and are symmetric Patient is severe tenderness to palpation over the origin of the quadriceps tendon at the aiis. Patient has some mild pain with internal range of motion but worse pain with flexion of the hip against resistance as well as quadriceps contraction.  Limited muscular skeletal ultrasound was performed and interpreted by Antoine PrimasSMITH, Mihran Lebarron, M  Limited ultrasound of the right hip shows the patient does have an avulsion fracture noted of the aiis Patient does have a tear of the quadriceps tendon that is partial but approximately 50% on the articular side. Callus formation noted. Increasing Doppler flow and hypoechoic changes. Impression: Proximal avulsion fracture of the quadriceps tendon.    Impression and Recommendations:

## 2015-05-04 LAB — POCT INR: INR: 3.5

## 2015-05-05 ENCOUNTER — Ambulatory Visit (INDEPENDENT_AMBULATORY_CARE_PROVIDER_SITE_OTHER): Payer: Managed Care, Other (non HMO) | Admitting: Cardiovascular Disease

## 2015-05-05 DIAGNOSIS — D6859 Other primary thrombophilia: Secondary | ICD-10-CM

## 2015-05-05 DIAGNOSIS — Z5181 Encounter for therapeutic drug level monitoring: Secondary | ICD-10-CM

## 2015-05-11 ENCOUNTER — Ambulatory Visit (INDEPENDENT_AMBULATORY_CARE_PROVIDER_SITE_OTHER): Payer: Managed Care, Other (non HMO) | Admitting: Family Medicine

## 2015-05-11 ENCOUNTER — Other Ambulatory Visit (INDEPENDENT_AMBULATORY_CARE_PROVIDER_SITE_OTHER): Payer: Managed Care, Other (non HMO)

## 2015-05-11 ENCOUNTER — Encounter: Payer: Self-pay | Admitting: Family Medicine

## 2015-05-11 VITALS — BP 122/80 | HR 91 | Ht 64.0 in | Wt 126.0 lb

## 2015-05-11 DIAGNOSIS — M79604 Pain in right leg: Secondary | ICD-10-CM

## 2015-05-11 DIAGNOSIS — S76191A Other specified injury of right quadriceps muscle, fascia and tendon, initial encounter: Secondary | ICD-10-CM

## 2015-05-11 DIAGNOSIS — S76191D Other specified injury of right quadriceps muscle, fascia and tendon, subsequent encounter: Secondary | ICD-10-CM

## 2015-05-11 NOTE — Assessment & Plan Note (Signed)
Patient is healing but is very slow. We discussed icing regimen, home exercises and patient work with Event organiserathletic trainer today. We discussed which activities to do an which was to potentially avoid. Patient is going to start increasing her activity slowly. Patient will come back again though in 3-4 weeks for further relation. Continue the high-dose vitamin D supplementation. If any worsening symptoms she will call sooner.  Spent  25 minutes with patient face-to-face and had greater than 50% of counseling including as described above in assessment and plan.

## 2015-05-11 NOTE — Progress Notes (Signed)
Pre visit review using our clinic review tool, if applicable. No additional management support is needed unless otherwise documented below in the visit note. 

## 2015-05-11 NOTE — Progress Notes (Signed)
  Subjective:    CC:  Right leg pain  HPI: Right leg pain. Patient was working out and felt pain in right hip and anterior leg with HIIT exercise.  e this was 3 weeks ago. Patient was diagnosed with more of an avulsion tear of the proximal quadricep tendon. Patient has been limiting her activity and doing compression as well as high-dose vitamin D. Patient has noticed some improvement. Still has some mild discomfort but is able to actually lift her leg without any significant pain. Patient has not done any of the her regular exercises.  Past medical history, Surgical history, Family history not pertinant except as noted below, Social history, Allergies, and medications have been entered into the medical record, reviewed, and no changes needed.   Review of Systems: No fevers, chills, night sweats, weight loss, chest pain, or shortness of breath.   Objective:   Blood pressure 122/80, pulse 91, height 5\' 4"  (1.626 m), weight 126 lb (57.153 kg), SpO2 95 %.  General: Well Developed, well nourished, and in no acute distress.  Neuro: Alert and oriented x3, extra-ocular muscles intact, sensation grossly intact.  HEENT: Normocephalic, atraumatic, pupils equal round reactive to light, neck supple, no masses, no lymphadenopathy, thyroid nonpalpable.  Skin: Warm and dry, no rashes. Cardiac:  no lower extremity edema. Respiratory: Not using accessory muscles, speaking in full sentences. Abdominal: NT, soft Gait: Nonantlagic, good balance and coordination Lymphatic: no lymphadenopathy in neck or axillae on palpation, non tender.  Musculoskeletal: Inspection and palpation of the right and left upper extremities including the shoulders elbows and wrist are unremarkable with full range of motion and good muscle strength and tone. Inspection and palpation of the right and left lower extremities including the hips knees and ankles are unremarkable and nontender with full range of motion and good muscle strength  and tone and are symmetric Continued tenderness at the origin of the quadriceps tendon proximally over the AIIS. Less tender than previous.  Limited muscular skeletal ultrasound was performed and interpreted by Antoine PrimasSMITH, ZACHARY, M  Limited ultrasound of the right hip shows the patient does have an avulsion fracture noted of the aiis improved displacement. improved callus formation as well.  Patient does have a tear of the quadriceps tendon that is partial can only approximately 25% of the articular side which is an improvement. Callus formation noted. Increasing Doppler flow and hypoechoic change still present.  Impression: Proximal avulsion fracture of the quadriceps tendon but now with callus oformation with near complete bridging  Procedure note 97110; 15 minutes spent for Therapeutic exercises as stated in above notes.  This included exercises focusing on stretching, strengthening, with significant focus on eccentric aspects. Patient given leg raises, stretching, working on ergonomics as well as theraband.   Proper technique shown and discussed handout in great detail with ATC.  All questions were discussed and answered.    Impression and Recommendations:

## 2015-05-11 NOTE — Patient Instructions (Signed)
Good to see you Continue the compression and the limitation in exercises for now until the 15th then 50% duration and intensity when you start.  Ice when you need it Talk about the coumadin  See me again 3-4 weeks otherwise

## 2015-05-24 ENCOUNTER — Telehealth: Payer: Self-pay | Admitting: Obstetrics & Gynecology

## 2015-05-24 NOTE — Telephone Encounter (Signed)
Patient has some questions regarding her IUD. Patient is not sure if she will need an appointment. Last seen 08/26/14.

## 2015-05-24 NOTE — Telephone Encounter (Signed)
Spoke with patient. Patient has a Mirena IUD in place. Was placed 08-19-2013. On October 30th patient had intercourse and began bleeding. Bleeding was like "a normal cycle." Bleeding lasted 7 days. Prior to this bleeding LMP was 03/07/2015. On 05/20/2015 patient began to have abdominal cramping. On 11/12 patient had intercourse. Denies any discomfort with intercourse. Today patient began to have light brown discharge. Patient is concerned about possible pregnancy or that her IUD may have moved. Denies any abdominal or pelvic pain. Is experiencing mild cramping. Patient again states concern about pregnancy. Advised will need to be seen in office for further evaluation. Patient is agreeable. Appointment scheduled for tomorrow 1/17 at 12:45 pm with Dr.Miller. Agreeable to date and time.  Routing to provider for final review. Patient agreeable to disposition. Will close encounter.

## 2015-05-24 NOTE — Telephone Encounter (Signed)
Left message to call Kiasha Bellin at [phone]. 

## 2015-05-25 ENCOUNTER — Ambulatory Visit (INDEPENDENT_AMBULATORY_CARE_PROVIDER_SITE_OTHER): Payer: Managed Care, Other (non HMO) | Admitting: Obstetrics & Gynecology

## 2015-05-25 ENCOUNTER — Telehealth: Payer: Self-pay | Admitting: Obstetrics & Gynecology

## 2015-05-25 VITALS — BP 122/90 | HR 78 | Resp 16 | Wt 130.0 lb

## 2015-05-25 DIAGNOSIS — Z30433 Encounter for removal and reinsertion of intrauterine contraceptive device: Secondary | ICD-10-CM | POA: Diagnosis not present

## 2015-05-25 DIAGNOSIS — Z86718 Personal history of other venous thrombosis and embolism: Secondary | ICD-10-CM

## 2015-05-25 DIAGNOSIS — Z8249 Family history of ischemic heart disease and other diseases of the circulatory system: Secondary | ICD-10-CM | POA: Diagnosis not present

## 2015-05-25 DIAGNOSIS — Z8679 Personal history of other diseases of the circulatory system: Secondary | ICD-10-CM

## 2015-05-25 DIAGNOSIS — N938 Other specified abnormal uterine and vaginal bleeding: Secondary | ICD-10-CM

## 2015-05-25 NOTE — Progress Notes (Signed)
IUD removal and reinsertion with pelvic ultrasound scheduled for 05-30-15 at 2pm. Patient aware to take Motrin 800 mg one hour prior with food.

## 2015-05-25 NOTE — Telephone Encounter (Signed)
Call to patient to review benefits for ultrasound and iud removal/reinsertion scheduled 05/30/15 with Dr Hyacinth MeekerMiller. Per DPR ok to leave details on voicemail for mobile number. Verified mobile number on DPR is same on file. Left benefit details on voicemail with explanation. Left reminder of arrival date/time for appointment. Gave return call information for any questions. Ok to close.

## 2015-05-25 NOTE — Progress Notes (Signed)
Subjective:     Patient ID: Michele Turner, female   DOB: 1976-04-17, 39 y.o.   MRN: [id number]  HPI 39 yo G0 MWF here for complaint of vaginal bleeding.  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  Due to this hx, she is using an IUD for contraception.  With her first Mirena IUD, she has some spotting during the first three years of use but this was fairly light and irregular.  Only as she got near the time for removal did her bleeding starting to increase.    However, with second Mirena IUD (placed 08/19/13), she's had significant irregular bleeding.  Currently, she's having bleeding after intercourse and having bleeding enough to be a cycle every 21-28 days.  She has skipped a month or two but this is rare.  This seems like a lot more bleeding to her than she had with the first IUD.  Pt just does NOT want to be pregnant and wants to make sure she has good contraception.  She can't feel her IUD strings.  Pt with significant hx of DVTs as well as strong family hx of DVT/PE.  Thus far, work up has been negative but is on chronic anticoagulation.  Pt has not seen a hematologist in several years.  Maternal grandmother with hx of DVT.  Mother with hx of DVT, PE, and stroke that was noted on MRI but she has no deficits.  Pt is wondering about whether she is on the best treatment for her hx at this time.  Also, she's had two muscle tears in the past year.  She does exercise regularly and is in really good shape.  Her trainer wonders if the coumadin is contributing.  I think seeing a hematologist at this time would be prudent.  Review of Systems  All other systems reviewed and are negative.     Objective:   Physical Exam  Constitutional: She is oriented to person, place, and time. She appears well-developed and well-nourished.  Abdominal: Soft. Bowel sounds are normal.  Genitourinary: Vagina normal and uterus normal. There is no rash, tenderness, lesion or injury on the  right labia. There is no rash, tenderness, lesion or injury on the left labia. Right adnexum displays no mass, no tenderness and no fullness. Left adnexum displays no mass, no tenderness and no fullness.  2cm IUD string noted  Lymphadenopathy:       Right: No inguinal adenopathy present.       Left: No inguinal adenopathy present.  Neurological: She is alert and oriented to person, place, and time.  Skin: Skin is warm and dry.  Psychiatric: She has a normal mood and affect.       Assessment:     DUB with current mirena IUD  H/O DVT x 2, work-up for clotting d/o negative Strong family hx of DVT with DVT in maternal GM and DVT/PE in mother    Plan:     Plan removal of current Mirena and replacement of Mirena IUD under ultrasound guidance.  Also, will refer to Dr. Myna HidalgoEnnever for opinion regarding DVT hx and family hx of DVT/PE.  Pt saw Dr. Truett PernaSherrill in the past but, again, it has been several years.  Her mother is seeing Dr. Myna HidalgoEnnever and she's like to see him too.    ~25 minutes spent with patient >50% of time was in face to face discussion of above.

## 2015-05-27 LAB — POCT INR: INR: 2.6

## 2015-05-28 ENCOUNTER — Encounter: Payer: Self-pay | Admitting: Obstetrics & Gynecology

## 2015-05-29 ENCOUNTER — Ambulatory Visit (INDEPENDENT_AMBULATORY_CARE_PROVIDER_SITE_OTHER): Payer: Managed Care, Other (non HMO) | Admitting: Internal Medicine

## 2015-05-29 DIAGNOSIS — D6859 Other primary thrombophilia: Secondary | ICD-10-CM

## 2015-05-29 DIAGNOSIS — Z5181 Encounter for therapeutic drug level monitoring: Secondary | ICD-10-CM

## 2015-05-30 ENCOUNTER — Ambulatory Visit (INDEPENDENT_AMBULATORY_CARE_PROVIDER_SITE_OTHER): Payer: Managed Care, Other (non HMO) | Admitting: Obstetrics & Gynecology

## 2015-05-30 ENCOUNTER — Ambulatory Visit (INDEPENDENT_AMBULATORY_CARE_PROVIDER_SITE_OTHER): Payer: Managed Care, Other (non HMO)

## 2015-05-30 DIAGNOSIS — Z30433 Encounter for removal and reinsertion of intrauterine contraceptive device: Secondary | ICD-10-CM

## 2015-05-30 DIAGNOSIS — Z7901 Long term (current) use of anticoagulants: Secondary | ICD-10-CM

## 2015-05-30 DIAGNOSIS — N938 Other specified abnormal uterine and vaginal bleeding: Secondary | ICD-10-CM | POA: Diagnosis not present

## 2015-05-30 DIAGNOSIS — Z86718 Personal history of other venous thrombosis and embolism: Secondary | ICD-10-CM

## 2015-05-30 NOTE — Progress Notes (Signed)
39 y.o. Michele Turner Marriedfemale here for a pelvic ultrasound due to DUB and possible removal/reinsertion of Mirena IUD.  Bleeding has gotten much more frequent in the last six months.  Pt with hx of DVT and is on chronic anticoagulation.  She is going to see Dr. Myna HidalgoEnnever for another opinion regarding treatment options.    Patient's last menstrual period was 05/07/2015.  Sexually active:  yes  Contraception: IUD  FINDINGS: UTERUS: 7.5 x 5.1 x 3.9cm EMS: 1.397mm with IUD in correct location ADNEXA:   Left ovary 2.2 x 1.5 x 1.3cm   Right ovary 3.7 x 1.5 x 1.3cm CUL DE SAC: no free fluid  Reviewed findings with pt.  She desires to try a new IUD to see if this will control bleeding better than current one.  She does not want to be on oral progesterone therapy, use Depo Provera, or proceed with any surgery at this time.  Pelvic exam: Vulva:  normal female genitalia Vagina:  normal vagina Cervix:  Non-tender, Negative CMT, no lesions or redness Uterus:  normal size, no IUD string noted   After patient read information booklet and all questions were answered, informed consent was obtained.      Procedure:  Speculum inserted into vagina. Cervix visualized and cleansed with betadine solution X 3. Paracervical block placed:  yes, 1% Lidocaine, Lot# [id number], exp 07/09/15.  10cc's total used.  Tenaculum placed on cervix at 12 o'clock position.  String hook passed into cervix and with one turn and removal, the IUD strin was easily seen.  String grasped with ringed forceps and removed without difficulty.  Uterus sounded to 7.5 centimeters.  IUD and inserting device removed from sterile packet and under sterile conditions inserted to fundus of uterus.  IUD released and introducer removed without difficulty.  IUD string trimmed to 2.5 centimeters.   Tenaculum removed.  Minimal bleeding noted.  Speculum removed.  Uterus palpated normal.  Patient tolerated procedure well.  IUD Lot #:[id number].  Exp: 6/19.  Package  information attached to consent and scanned into EPIC.  A: DUB with current Mirena IUD H/O DVT on chronic anticoagulation with coumadin Removal of and insertion of Mirena IUD  P: Return to office 4-6 weeks for recheck      Pt knows IUD needs to be replaced approximately 5 years, no later than 05/29/2020.  Instructions provided.

## 2015-06-05 ENCOUNTER — Ambulatory Visit (INDEPENDENT_AMBULATORY_CARE_PROVIDER_SITE_OTHER): Payer: Managed Care, Other (non HMO) | Admitting: Family Medicine

## 2015-06-05 ENCOUNTER — Encounter: Payer: Self-pay | Admitting: Family Medicine

## 2015-06-05 VITALS — BP 110/78 | HR 72 | Ht 64.0 in

## 2015-06-05 DIAGNOSIS — S76191A Other specified injury of right quadriceps muscle, fascia and tendon, initial encounter: Secondary | ICD-10-CM | POA: Diagnosis not present

## 2015-06-05 NOTE — Progress Notes (Signed)
Pre visit review using our clinic review tool, if applicable. No additional management support is needed unless otherwise documented below in the visit note. 

## 2015-06-05 NOTE — Progress Notes (Signed)
  Subjective:    CC:  Right leg pain  HPI: Right leg pain. Patient was working out and felt pain in right hip and anterior leg with HIIT exercise.  e this was 6 weeks ago. Patient was diagnosed with more of an avulsion tear of the proximal quadricep tendon. Patient has been limiting her activity and doing compression as well as high-dose vitamin D. Patient continues to improve. Has started working out again for the last 2 weeks. Avoiding running or any jumping. States that some lifting seems to give her discomfort. Has stopped the nitroglycerin at this time. Overall though would state that she is about 85% better.  Past medical history, Surgical history, Family history not pertinant except as noted below, Social history, Allergies, and medications have been entered into the medical record, reviewed, and no changes needed.   Review of Systems: No fevers, chills, night sweats, weight loss, chest pain, or shortness of breath.   Objective:   Blood pressure 110/78, pulse 72, height 5\' 4"  (1.626 m), last menstrual period 05/07/2015, SpO2 98 %.  General: Well Developed, well nourished, and in no acute distress.  Neuro: Alert and oriented x3, extra-ocular muscles intact, sensation grossly intact.  HEENT: Normocephalic, atraumatic, pupils equal round reactive to light, neck supple, no masses, no lymphadenopathy, thyroid nonpalpable.  Skin: Warm and dry, no rashes. Cardiac:  no lower extremity edema. Respiratory: Not using accessory muscles, speaking in full sentences. Abdominal: NT, soft Gait: Nonantlagic, good balance and coordination Lymphatic: no lymphadenopathy in neck or axillae on palpation, non tender.  Musculoskeletal: Inspection and palpation of the right and left upper extremities including the shoulders elbows and wrist are unremarkable with full range of motion and good muscle strength and tone. Inspection and palpation of the right and left lower extremities including the hips knees and  ankles are unremarkable and nontender with full range of motion and good muscle strength and tone and are symmetric Nontender over the AIIS.  Increasing strength over the quadricep with resisted extension and flexion. New the same as the contralateral side.  Limited muscular skeletal ultrasound was performed and interpreted by Antoine PrimasSMITH, ZACHARY, M  Limited ultrasound of the right hip shows the patient does avulsion fracture is a well-healing. Good callus formation noted. Still hypoechoic changes surrounding the quadricep tendon itself. No true tearing appreciated. Mild articular gapping may be noted. Impression: Interval healing of avulsion fracture.  Impression and Recommendations:

## 2015-06-05 NOTE — Patient Instructions (Signed)
Verbal instructions given an exercise prescription given.

## 2015-06-05 NOTE — Assessment & Plan Note (Signed)
Improving overall. Patient given an exercise prescription will start slowly increasing. We discussed which activities to still avoid over the course of next 4 weeks. We discussed icing regimen. Patient and will try to make these changes and come back and see me again in 4 weeks to make sure completely resolved.  Spent  25 minutes with patient face-to-face and had greater than 50% of counseling including as described above in assessment and plan.

## 2015-06-12 ENCOUNTER — Encounter: Payer: Self-pay | Admitting: Obstetrics & Gynecology

## 2015-06-16 ENCOUNTER — Ambulatory Visit (INDEPENDENT_AMBULATORY_CARE_PROVIDER_SITE_OTHER): Payer: Managed Care, Other (non HMO) | Admitting: Internal Medicine

## 2015-06-16 DIAGNOSIS — Z5181 Encounter for therapeutic drug level monitoring: Secondary | ICD-10-CM

## 2015-06-16 DIAGNOSIS — D6859 Other primary thrombophilia: Secondary | ICD-10-CM

## 2015-06-16 LAB — POCT INR: INR: 2.1

## 2015-06-29 ENCOUNTER — Ambulatory Visit (INDEPENDENT_AMBULATORY_CARE_PROVIDER_SITE_OTHER): Payer: Managed Care, Other (non HMO) | Admitting: Cardiology

## 2015-06-29 DIAGNOSIS — D6859 Other primary thrombophilia: Secondary | ICD-10-CM

## 2015-06-29 DIAGNOSIS — Z5181 Encounter for therapeutic drug level monitoring: Secondary | ICD-10-CM

## 2015-06-29 LAB — POCT INR: INR: 2.5

## 2015-07-06 ENCOUNTER — Ambulatory Visit: Payer: Self-pay | Admitting: Hematology & Oncology

## 2015-07-06 ENCOUNTER — Ambulatory Visit: Payer: Managed Care, Other (non HMO)

## 2015-07-06 ENCOUNTER — Other Ambulatory Visit: Payer: Managed Care, Other (non HMO)

## 2015-07-14 ENCOUNTER — Ambulatory Visit (INDEPENDENT_AMBULATORY_CARE_PROVIDER_SITE_OTHER): Payer: Managed Care, Other (non HMO) | Admitting: Cardiology

## 2015-07-14 DIAGNOSIS — Z5181 Encounter for therapeutic drug level monitoring: Secondary | ICD-10-CM

## 2015-07-14 DIAGNOSIS — D6859 Other primary thrombophilia: Secondary | ICD-10-CM

## 2015-07-14 LAB — POCT INR: INR: 2.7

## 2015-07-18 ENCOUNTER — Ambulatory Visit (INDEPENDENT_AMBULATORY_CARE_PROVIDER_SITE_OTHER): Payer: Managed Care, Other (non HMO) | Admitting: Family Medicine

## 2015-07-18 ENCOUNTER — Encounter: Payer: Self-pay | Admitting: Family Medicine

## 2015-07-18 ENCOUNTER — Other Ambulatory Visit (INDEPENDENT_AMBULATORY_CARE_PROVIDER_SITE_OTHER): Payer: Managed Care, Other (non HMO)

## 2015-07-18 VITALS — BP 126/74 | HR 66 | Ht 64.0 in

## 2015-07-18 DIAGNOSIS — M542 Cervicalgia: Secondary | ICD-10-CM

## 2015-07-18 DIAGNOSIS — S76191A Other specified injury of right quadriceps muscle, fascia and tendon, initial encounter: Secondary | ICD-10-CM

## 2015-07-18 DIAGNOSIS — M9903 Segmental and somatic dysfunction of lumbar region: Secondary | ICD-10-CM | POA: Diagnosis not present

## 2015-07-18 DIAGNOSIS — M9902 Segmental and somatic dysfunction of thoracic region: Secondary | ICD-10-CM | POA: Diagnosis not present

## 2015-07-18 DIAGNOSIS — M999 Biomechanical lesion, unspecified: Secondary | ICD-10-CM

## 2015-07-18 DIAGNOSIS — M9901 Segmental and somatic dysfunction of cervical region: Secondary | ICD-10-CM

## 2015-07-18 NOTE — Progress Notes (Signed)
Pre visit review using our clinic review tool, if applicable. No additional management support is needed unless otherwise documented below in the visit note. 

## 2015-07-18 NOTE — Assessment & Plan Note (Signed)
Discussed with patient lifting on a more frequent basis the ergonomics. Patient will keep her hands within her peripheral vision. We discussed upper back strength exercising. Did respond well to osteopathic manipulation.

## 2015-07-18 NOTE — Assessment & Plan Note (Signed)
Decision today to treat with OMT was based on Physical Exam  After verbal consent patient was treated with   ME techniques in cervical, thoracic and lumbar areas Patient tolerated the procedure well with improvement in symptoms  Patient given exercises, stretches and lifestyle modifications including work positioning  See medications in patient instructions if given  Patient will follow up in 4-6 weeks

## 2015-07-18 NOTE — Assessment & Plan Note (Signed)
Patient is doing much better overall. We discussed icing regimen and home exercises. We discussed what activities she can do. Exercise prescription given. We discussed which activities she should avoid. Patient will come back if any worsening symptoms.

## 2015-07-18 NOTE — Patient Instructions (Signed)
Good to see you Ice is your friend No box jumps or significant loads  Decrease resistance on rowing and increase one level a week Continue the vitaminD See me when you need me.

## 2015-07-18 NOTE — Progress Notes (Signed)
  Subjective:    CC:  Right leg pain follow-up  HPI: Right leg pain. Patient was working out and felt pain in right hip and anterior leg with HIIT exercise. Patient is been doing conservative therapy for an avulsion fracture. Patient is doing much better overall. Some tightness in the quadriceps bilaterally. Patient has started working out again. Patient is going to 3 different [employer]earing[employer]. Has noticed though no significant pain. Has not done any of the high impact exercises that seemed to cause the problem previously. Patient rates the discomfort as 2 out of 10. No radiation down the leg. No groin pain with it. Patient is wanting to make sure those that she is not injuring herself. This stopped the nitroglycerin patches.  Mild increase and neck pain from previous exam. Describes the pain as a dull, throbbing aching sensation. Overall seems to be doing relatively well. Patient though states that since she's been working out more frequently she has had some tightness. Has responded to osteopathic manipulation previously.  Past medical history, Surgical history, Family history not pertinant except as noted below, Social history, Allergies, and medications have been entered into the medical record, reviewed, and no changes needed.   Review of Systems: No fevers, chills, night sweats, weight loss, chest pain, or shortness of breath.   Objective:   Blood pressure 126/74, pulse 66, height 5\' 4"  (1.626 m), SpO2 98 %.  General: Well Developed, well nourished, and in no acute distress.  Neuro: Alert and oriented x3, extra-ocular muscles intact, sensation grossly intact.  HEENT: Normocephalic, atraumatic, pupils equal round reactive to light, neck supple, no masses, no lymphadenopathy, thyroid nonpalpable.  Skin: Warm and dry, no rashes. Cardiac:  no lower extremity edema. Respiratory: Not using accessory muscles, speaking in full sentences. Abdominal: NT, soft Gait: Nonantlagic, good balance and  coordination Lymphatic: no lymphadenopathy in neck or axillae on palpation, non tender.  Musculoskeletal: Inspection and palpation of the right and left upper extremities including the shoulders elbows and wrist are unremarkable with full range of motion and good muscle strength and tone. Inspection and palpation of the right and left lower extremities including the hips knees and ankles are unremarkable and nontender with full range of motion and good muscle strength and tone and are symmetric Nontender over the AIIS. Full strength to the contralateral side.  Limited muscular skeletal ultrasound was performed and interpreted by Antoine PrimasSMITH, Lorey Pallett, M  Limited ultrasound of the right hip shows the patient does avulsion fracture is well healed at this time. Mild increase in Doppler flow in the area still. No new problems. Impression: Completely healed avulsion fracture  Osteopathic findings C2 flexed rotated and side bent right C6 flexed rotated and side bent left  T3 extended rotated and side bent right T7 extended rotated and side bent left  L2 flexed rotated and side bent right  Impression and Recommendations:

## 2015-08-04 ENCOUNTER — Ambulatory Visit (INDEPENDENT_AMBULATORY_CARE_PROVIDER_SITE_OTHER): Payer: Managed Care, Other (non HMO) | Admitting: Cardiovascular Disease

## 2015-08-04 DIAGNOSIS — Z5181 Encounter for therapeutic drug level monitoring: Secondary | ICD-10-CM

## 2015-08-04 DIAGNOSIS — D6859 Other primary thrombophilia: Secondary | ICD-10-CM

## 2015-08-04 LAB — POCT INR: INR: 3.1

## 2015-08-07 ENCOUNTER — Encounter: Payer: Self-pay | Admitting: Hematology & Oncology

## 2015-08-07 ENCOUNTER — Other Ambulatory Visit (HOSPITAL_BASED_OUTPATIENT_CLINIC_OR_DEPARTMENT_OTHER): Payer: Managed Care, Other (non HMO)

## 2015-08-07 ENCOUNTER — Ambulatory Visit (HOSPITAL_BASED_OUTPATIENT_CLINIC_OR_DEPARTMENT_OTHER): Payer: Managed Care, Other (non HMO) | Admitting: Hematology & Oncology

## 2015-08-07 VITALS — BP 122/77 | HR 64 | Temp 97.8°F | Resp 16 | Ht 64.0 in | Wt 133.0 lb

## 2015-08-07 DIAGNOSIS — Z803 Family history of malignant neoplasm of breast: Secondary | ICD-10-CM

## 2015-08-07 DIAGNOSIS — Z8489 Family history of other specified conditions: Secondary | ICD-10-CM

## 2015-08-07 DIAGNOSIS — Z800 Family history of malignant neoplasm of digestive organs: Secondary | ICD-10-CM

## 2015-08-07 DIAGNOSIS — I82412 Acute embolism and thrombosis of left femoral vein: Secondary | ICD-10-CM

## 2015-08-07 DIAGNOSIS — I82432 Acute embolism and thrombosis of left popliteal vein: Secondary | ICD-10-CM | POA: Diagnosis not present

## 2015-08-07 DIAGNOSIS — I82402 Acute embolism and thrombosis of unspecified deep veins of left lower extremity: Secondary | ICD-10-CM

## 2015-08-07 LAB — CBC WITH DIFFERENTIAL (CANCER CENTER ONLY)
BASO#: 0 10*3/uL (ref 0.0–0.2)
BASO%: 0.6 % (ref 0.0–2.0)
EOS ABS: 0.1 10*3/uL (ref 0.0–0.5)
EOS%: 1.2 % (ref 0.0–7.0)
HEMATOCRIT: 38.5 % (ref 34.8–46.6)
HGB: 13.2 g/dL (ref 11.6–15.9)
LYMPH#: 1.1 10*3/uL (ref 0.9–3.3)
LYMPH%: 21.8 % (ref 14.0–48.0)
MCH: 30.8 pg (ref 26.0–34.0)
MCHC: 34.3 g/dL (ref 32.0–36.0)
MCV: 90 fL (ref 81–101)
MONO#: 0.5 10*3/uL (ref 0.1–0.9)
MONO%: 11 % (ref 0.0–13.0)
NEUT#: 3.2 10*3/uL (ref 1.5–6.5)
NEUT%: 65.4 % (ref 39.6–80.0)
PLATELETS: 261 10*3/uL (ref 145–400)
RBC: 4.29 10*6/uL (ref 3.70–5.32)
RDW: 12.6 % (ref 11.1–15.7)
WBC: 4.9 10*3/uL (ref 3.9–10.0)

## 2015-08-07 NOTE — Progress Notes (Signed)
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 OTHER    New Patient  : I want to get off Coumadin.  HPI: Michele Turner is a very charming 40 year old white female. 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  She apparently developed a thrombus in the left leg back in 2010. She was not traveling. She had no injury. She developed the thrombus in the left femoral vein and left popliteal vein.  Hypercoagulable studies were negative. She said that she probably was low with protein C and protein S.  She went to [facility]. She saw Dr. Isaiah Serge. She decided and the one time. She is on Coumadin. She's been on Coumadin now for 6-1/2 years. I'm not sure as to why this has not been stopped.  She had a follow-up Doppler in 2011. This showed a chronic thrombus with collaterals.  She apparently has been having some muscle tears. She was wondering if the Coumadin may be partly responsible for this. She'll ice exercise. She really hates taking Coumadin because it interferes with which she can eat.  She's had no cough. She's had no bleeding. She's had no change in bowel or bladder habits.  She's never been pregnant. She does not smoke. She used to drink quite heavily but now is abstaining from alcohol.  Her mother had a history of thrombus and a TIA.  She's had no thyroid issues. She's had no problem with infections.  Overall, her performance status is ECOG 0.   Past Medical History  Diagnosis Date  . GERD (gastroesophageal reflux disease)   . Hiatal hernia   . DVT (deep venous thrombosis) (HCC) 12/26/08    on OCP - YASMIN  . Anxiety   :  Past Surgical History  Procedure Laterality Date  . Breast enhancement surgery    . Intrauterine device insertion  02/02/09, 08/19/13    Mirena  :   Current outpatient prescriptions:  .  ALPRAZolam (XANAX)  0.25 MG tablet, Take 1 tablet (0.25 mg total) by mouth 3 (three) times daily as needed., Disp: 30 tablet, Rfl: 0 .  amphetamine-dextroamphetamine (ADDERALL) 10 MG tablet, Take 1 tablet by mouth daily as needed., Disp: , Rfl:  .  BACTROBAN 2 % ointment, , Disp: , Rfl:  .  buPROPion (WELLBUTRIN) 75 MG tablet, Take 75 mg by mouth 2 (two) times daily., Disp: , Rfl: 5 .  COUMADIN 5 MG tablet, TAKE AS DIRECTED BY COUMADIN CLINIC, Disp: 90 tablet, Rfl: 3 .  cyclobenzaprine (FLEXERIL) 10 MG tablet, Take 1 tablet (10 mg total) by mouth 3 (three) times daily as needed for muscle spasms., Disp: 30 tablet, Rfl: 0 .  diazepam (VALIUM) 5 MG tablet, Take 1 tablet (5 mg total) by mouth every 12 (twelve) hours as needed for anxiety., Disp: 30 tablet, Rfl: 1 .  famotidine (PEPCID) 20 MG tablet, Take 1 tablet (20 mg total) by mouth 2 (two) times daily., Disp: 30 tablet, Rfl: 1 .  ibuprofen (ADVIL,MOTRIN) 800 MG tablet, Take 1 tablet (800 mg total) by mouth every 8 (eight) hours as needed for pain., Disp: 30 tablet, Rfl: 3 .  levonorgestrel (MIRENA) 20 MCG/24HR IUD, 1 each by Intrauterine route once.  , Disp: , Rfl:  .  LEXAPRO 10 MG tablet, Take 1 tablet (10 mg total) by mouth daily. Office visit due now, Disp: 90 tablet,  Rfl: 0 .  lisdexamfetamine (VYVANSE) 70 MG capsule, Take 1 capsule (70 mg total) by mouth daily., Disp: 30 capsule, Rfl: 0 .  nitroGLYCERIN (NITRODUR - DOSED IN MG/24 HR) 0.2 mg/hr patch, 1/4 patch daily, Disp: 30 patch, Rfl: 1 .  omeprazole-sodium bicarbonate (ZEGERID) 40-1100 MG per capsule, Take 1 capsule by mouth daily.  , Disp: , Rfl:  .  URELLE (URELLE/URISED) 81 MG TABS, Take 1 tablet (81 mg total) by mouth as directed., Disp: 120 each, Rfl: 5 .  VALTREX 1 G tablet, TAKE AS DIRECTED., Disp: 30 tablet, Rfl: 11 .  VERAMYST 27.5 MCG/SPRAY nasal spray, USE 2 SPRAYS INTO THE NOSE DAILY., Disp: 10 g, Rfl: 5:  :  No Known Allergies:  Family History  Problem Relation Age of Onset  . Asthma     . Hypertension    . Hyperlipidemia    . Breast cancer    . Colon cancer Paternal Grandmother     and paternal great grandfather  . Hypertension Mother   . Hyperlipidemia Mother   . Heart disease Mother   . Asthma Mother   . Pulmonary embolism Mother   . Osteopenia Mother   . Cancer Father     prostate  . Heart Problems Maternal Grandmother     born with hole in heart, now has pacemaker  . Osteoporosis Maternal Grandmother   . Diabetes Maternal Grandfather   :  Social History   Social History  . Marital Status: Married    Spouse Name: N/A  . Number of Children: N/A  . Years of Education: N/A   Occupational History  . 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 Smoking status: Never Smoker   . Smokeless tobacco: Never Used  . Alcohol Use: 0.0 oz/week    0 Standard drinks or equivalent per week     Comment: wine every day  . Drug Use: No  . Sexual Activity: Yes    Birth Control/ Protection: IUD   Other Topics Concern  . Not on file   Social History Narrative  :  Pertinent items are noted in HPI.  Exam: @  well-developed and well-nourished white female in no obvious distress. Her vital signs show temperature of 97.8. Pulse 64. Blood pressure 122/77. Weight is 130 pounds. Head and neck exam shows no ocular or oral lesions. She has no palpable cervical or supraclavicular lymph nodes. Lungs are clear. Cardiac exam regular rate and rhythm with a normal S1 and S2. She has no murmurs, rubs or bruits. Abdomen is soft. Shows good bowel sounds. There is no fluid wave. There is no palpable liver or spleen tip. Back exam shows no tenderness over the spine, ribs or hips. Extremities shows no clubbing, cyanosis or edema. She has no palpable venous cord in the legs. She has a negative Homans sign in the legs. She has good pulses in her distal extremities. Skin exam shows no rashes, ecchymoses or petechia. Neurological exam shows no focal neurological deficits.     Recent Labs  08/07/15 1353  WBC 4.9  HGB 13.2  HCT 38.5  PLT 261   No results for input(s): NA, K, CL, CO2, GLUCOSE, BUN, CREATININE, CALCIUM in the last 72 hours.  Blood smear review:  None  Pathology: None     Assessment and Plan:  Michele Turner is a very charming 40 year old white female. She has    a single episode of a thromboembolic event. This was of  the left leg.  I still cannot figure out why she is still on Coumadin. It does not make much sense to me that she is on Coumadin. However, I suppose that with the family history of thromboembolic disease, there is concern that if she got off Coumadin that she would have another blood clot.  Of note, she does have a thigh-high compression stocking that she wears when she travels.  I really believe that she would be okay to get off Coumadin. I would put her on 2 baby aspirin a day. I think this will help in decreasing the risk of thromboembolic disease.  I talked her for about 45 minutes. I explained my reasoning for getting her off anticoagulation. Coumadin really is a hindrance to her. I would think that she may been put on whether new or anticoagulants which are much more "patient friendly".  I will like to get her back in 2 months. I will recheck some hypercoagulable studies. I will repeat a Doppler of her left leg.  She does have a thrombus again, then she will need lifelong anticoagulation. She does understand this.  It was nice talking to her. She is obviously well versed and very knowledgeable about blood clots.  I told her that she must drink a lot of water. I told her to take the 2 baby aspirin in the morning with food. She understands this.

## 2015-08-08 ENCOUNTER — Ambulatory Visit: Payer: Self-pay | Admitting: Internal Medicine

## 2015-08-08 DIAGNOSIS — Z5181 Encounter for therapeutic drug level monitoring: Secondary | ICD-10-CM

## 2015-08-08 DIAGNOSIS — D6859 Other primary thrombophilia: Secondary | ICD-10-CM

## 2015-08-08 LAB — D-DIMER, QUANTITATIVE (NOT AT ARMC)

## 2015-08-15 ENCOUNTER — Encounter: Payer: Self-pay | Admitting: Family Medicine

## 2015-08-15 ENCOUNTER — Ambulatory Visit (INDEPENDENT_AMBULATORY_CARE_PROVIDER_SITE_OTHER): Payer: Managed Care, Other (non HMO) | Admitting: Family Medicine

## 2015-08-15 VITALS — BP 128/80 | HR 80

## 2015-08-15 DIAGNOSIS — X503XXA Overexertion from repetitive movements, initial encounter: Secondary | ICD-10-CM | POA: Insufficient documentation

## 2015-08-15 DIAGNOSIS — M9902 Segmental and somatic dysfunction of thoracic region: Secondary | ICD-10-CM | POA: Diagnosis not present

## 2015-08-15 DIAGNOSIS — M999 Biomechanical lesion, unspecified: Secondary | ICD-10-CM

## 2015-08-15 DIAGNOSIS — M9903 Segmental and somatic dysfunction of lumbar region: Secondary | ICD-10-CM | POA: Diagnosis not present

## 2015-08-15 DIAGNOSIS — T148XXA Other injury of unspecified body region, initial encounter: Principal | ICD-10-CM

## 2015-08-15 DIAGNOSIS — T148 Other injury of unspecified body region: Secondary | ICD-10-CM

## 2015-08-15 DIAGNOSIS — M9901 Segmental and somatic dysfunction of cervical region: Secondary | ICD-10-CM | POA: Diagnosis not present

## 2015-08-15 DIAGNOSIS — F429 Obsessive-compulsive disorder, unspecified: Secondary | ICD-10-CM

## 2015-08-15 NOTE — Progress Notes (Signed)
  Subjective:    CC:  Right leg pain follow-up  HPI: Patient states that she is having pain in the hips bilaterally. An states that as more of a tightness. Patient is working out 6-8 times a week. Usually for approximately an hour. Does not do any off days at the moment. Patient is stress because she keeps comparing herself to other people and feel like she should be lifting more. Patient feels like she does not look the way that she would like in the mere. Does have a past medical history significant for OCD. States that she has been on the same dose Lexapro for a long time.. States that the neck is doing relatively well.  Past medical history, Surgical history, Family history not pertinant except as noted below, Social history, Allergies, and medications have been entered into the medical record, reviewed, and no changes needed.   Review of Systems: No fevers, chills, night sweats, weight loss, chest pain, or shortness of breath.   Objective:   Blood pressure 128/80, pulse 80, SpO2 96 %.  General: Well Developed, well nourished, and in no acute distress.  Neuro: Alert and oriented x3, extra-ocular muscles intact, sensation grossly intact.  HEENT: Normocephalic, atraumatic, pupils equal round reactive to light, neck supple, no masses, no lymphadenopathy, thyroid nonpalpable.  Skin: Warm and dry, no rashes. Cardiac:  no lower extremity edema. Respiratory: Not using accessory muscles, speaking in full sentences. Abdominal: NT, soft Gait: Nonantlagic, good balance and coordination Lymphatic: no lymphadenopathy in neck or axillae on palpation, non tender.  Musculoskeletal: Inspection and palpation of the right and left upper extremities including the shoulders elbows and wrist are unremarkable with full range of motion and good muscle strength and tone. Inspection and palpation of the right and left lower extremities including the hips knees and ankles are unremarkable and nontender with full  range of motion and good muscle strength and tone and are symmetric Nontender over the AIIS. Patient does have some stiffness of the hip girdle bilaterally. Mild tenderness over the groin area on the left side.   Osteopathic findings C2 flexed rotated and side bent right C6 flexed rotated and side bent left  T3 extended rotated and side bent right T7 extended rotated and side bent left  L2 flexed rotated and side bent right  Impression and Recommendations:

## 2015-08-15 NOTE — Assessment & Plan Note (Signed)
Patient is having more of an overuse syndrome. Discussed with patient at great length. I do think that this is likely secondary to her not relaxing. I do think the patient's history of OCD is also contributing and I think there is some underlying depression and anxiety disorder that is also contributing to a psychosomatic response. Patient did have significant tangental thoughts today and had difficulty standing on point. I do think the patient is having some difficulty with this. Patient was not willing to make any change in her medication today. Patient without her primary care provider. They will discuss possibly changing some medicines. We discussed at this point patient is going to likely have some difficulty she does not slow down in the near future. Patient will likely at increased risk of injury. Patient is going to try to take too off days a week for short course of time. I like to see her back again in 2-3 weeks for further evaluation and treatment. Spent  25 minutes with patient face-to-face and had greater than 50% of counseling including as described above in assessment and plan.

## 2015-08-15 NOTE — Progress Notes (Signed)
Pre visit review using our clinic review tool, if applicable. No additional management support is needed unless otherwise documented below in the visit note. 

## 2015-08-15 NOTE — Assessment & Plan Note (Signed)
I think patient would mean change of medication. Patient will talk to primary care provider. I do think that there is some generalized anxiety disorder and we did discuss the possibility of Effexor.

## 2015-08-15 NOTE — Assessment & Plan Note (Addendum)
Decision today to treat with OMT was based on Physical Exam  After verbal consent patient was treated with   ME techniques in cervical, thoracic and lumbar areas Patient tolerated the procedure well with improvement in symptoms  Patient given exercises, stretches and lifestyle modifications including work positioning  See medications in patient instructions if given  Patient will follow up in 2-3 weeks

## 2015-08-15 NOTE — Patient Instructions (Signed)
Good to see you  Overall the muscles look good.  I want you to only do orange theroy 2 times a week then trainer 2 times a week and Yoga 1 time a week.  You need to rest a little Consider effexor instead of lexapro, I really think it could make a difference ask your PCP or we can talk about it in 2 weeks 64-80 oz of water daily  Compression shorts with rowing and consider decreasing resistance for a couple weeks See me again in 2-3 weeks.

## 2015-08-29 ENCOUNTER — Telehealth: Payer: Self-pay | Admitting: *Deleted

## 2015-08-29 ENCOUNTER — Ambulatory Visit: Payer: Managed Care, Other (non HMO) | Admitting: Nurse Practitioner

## 2015-08-29 NOTE — Telephone Encounter (Signed)
Called and spoke with [facility] and informed them that as of Jan 30,2017 pt's coumadin had been discontinued and she is now on 2 tablets of 81 mg Aspirin  and he stated understanding and will discharge in their system

## 2015-09-04 ENCOUNTER — Ambulatory Visit: Payer: Managed Care, Other (non HMO) | Admitting: Obstetrics & Gynecology

## 2015-09-06 ENCOUNTER — Telehealth: Payer: Self-pay | Admitting: Nurse Practitioner

## 2015-09-06 ENCOUNTER — Ambulatory Visit (INDEPENDENT_AMBULATORY_CARE_PROVIDER_SITE_OTHER): Payer: Managed Care, Other (non HMO) | Admitting: Obstetrics and Gynecology

## 2015-09-06 ENCOUNTER — Ambulatory Visit: Payer: Managed Care, Other (non HMO) | Admitting: Nurse Practitioner

## 2015-09-06 ENCOUNTER — Encounter: Payer: Self-pay | Admitting: Obstetrics and Gynecology

## 2015-09-06 VITALS — BP 112/58 | HR 64 | Resp 14 | Ht 64.0 in | Wt 126.0 lb

## 2015-09-06 DIAGNOSIS — Z124 Encounter for screening for malignant neoplasm of cervix: Secondary | ICD-10-CM

## 2015-09-06 DIAGNOSIS — Z01419 Encounter for gynecological examination (general) (routine) without abnormal findings: Secondary | ICD-10-CM | POA: Diagnosis not present

## 2015-09-06 NOTE — Progress Notes (Signed)
Patient ID: Michele Turner, female   DOB: 07/09/1975, 40 y.o.   MRN: [id number] 40 y.o. G0P0 MarriedCaucasianF here for annual exam.  She has a h/o DVT, has been on coumadin since 2010. She saw hematologist and went off of coumadin in 1/17. Negative coagulation panel. She was on OCP's at the time of the DVT. No plans for pregnancy. No dyspareunia. Period Cycle (Days): 28 Period Duration (Days): 4-5 days  Period Pattern: Regular Menstrual Flow: Light Menstrual Control: Thin pad Dysmenorrhea: None  Patient's last menstrual period was 08/09/2015.          Sexually active: Yes.    The current method of family planning is IUD.   Mirena placed in 11/16 Exercising: Yes.    strength training and internal training  Smoker:  no  Health Maintenance: Pap:  08-26-14 ASCUS NEG HR HPV  History of abnormal Pap:  no MMG:  Never Colonoscopy:  Never BMD:   Never TDaP:  10-14-07 Gardasil: N/A   reports that she has never smoked. She has never used smokeless tobacco. 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   Past Medical History  Diagnosis Date  . GERD (gastroesophageal reflux disease)   . Hiatal hernia   . DVT (deep venous thrombosis) (HCC) 12/26/08    on OCP - YASMIN  . Anxiety   H/O cold sores  Past Surgical History  Procedure Laterality Date  . Breast enhancement surgery    . Intrauterine device insertion  02/02/09, 08/19/13    Mirena    Current Outpatient Prescriptions  Medication Sig Dispense Refill  . ALPRAZolam (XANAX) 0.25 MG tablet Take 1 tablet (0.25 mg total) by mouth 3 (three) times daily as needed. 30 tablet 0  . amphetamine-dextroamphetamine (ADDERALL) 10 MG tablet Take 1 tablet by mouth daily as needed.    [name] BACTROBAN 2 % ointment     . buPROPion (WELLBUTRIN) 75 MG tablet Take 75 mg by mouth 2 (two) times daily.  5  . cyclobenzaprine (FLEXERIL) 10 MG tablet Take 1 tablet (10 mg total) by mouth 3 (three) times daily as  needed for muscle spasms. 30 tablet 0  . diazepam (VALIUM) 5 MG tablet Take 1 tablet (5 mg total) by mouth every 12 (twelve) hours as needed for anxiety. 30 tablet 1  . famotidine (PEPCID) 20 MG tablet Take 1 tablet (20 mg total) by mouth 2 (two) times daily. 30 tablet 1  . ibuprofen (ADVIL,MOTRIN) 800 MG tablet Take 1 tablet (800 mg total) by mouth every 8 (eight) hours as needed for pain. 30 tablet 3  . levonorgestrel (MIRENA) 20 MCG/24HR IUD 1 each by Intrauterine route once.      [name] LEXAPRO 10 MG tablet Take 1 tablet (10 mg total) by mouth daily. Office visit due now 90 tablet 0  . lisdexamfetamine (VYVANSE) 70 MG capsule Take 1 capsule (70 mg total) by mouth daily. 30 capsule 0  . nitroGLYCERIN (NITRODUR - DOSED IN MG/24 HR) 0.2 mg/hr patch 1/4 patch daily 30 patch 1  . omeprazole-sodium bicarbonate (ZEGERID) 40-1100 MG per capsule Take 1 capsule by mouth daily.      [name] URELLE (URELLE/URISED) 81 MG TABS Take 1 tablet (81 mg total) by mouth as directed. 120 each 5  . VALTREX 1 G tablet TAKE AS DIRECTED. 30 tablet 11  . VERAMYST 27.5 MCG/SPRAY nasal spray USE 2 SPRAYS INTO THE NOSE DAILY. 10 g 5   No current facility-administered medications  for this visit.    Family History  Problem Relation Age of Onset  . Asthma    . Hypertension    . Hyperlipidemia    . Breast cancer    . Colon cancer Paternal Grandmother     and paternal great grandfather  . Hypertension Mother   . Hyperlipidemia Mother   . Heart disease Mother   . Asthma Mother   . Pulmonary embolism Mother   . Osteopenia Mother   . Cancer Father     prostate  . Heart Problems Maternal Grandmother     born with hole in heart, now has pacemaker  . Osteoporosis Maternal Grandmother   . Diabetes Maternal Grandfather     Review of Systems  Constitutional: Negative.   HENT: Negative.   Eyes: Negative.   Respiratory: Negative.   Cardiovascular: Negative.   Gastrointestinal: Negative.   Endocrine: Negative.    Genitourinary: Negative.   Musculoskeletal: Negative.   Skin: Negative.   Allergic/Immunologic: Negative.   Neurological: Negative.   Psychiatric/Behavioral: Negative.     Exam:   BP 112/58 mmHg  Pulse 64  Resp 14  Ht  (1.626 m)  Wt 126 lb (57.153 kg)  BMI 21.62 kg/m2  LMP 08/09/2015  Weight change: @ Height:   Height:  (162.6 cm)  Ht Readings from Last 3 Encounters:  09/06/15  (1.626 m)  08/07/15  (1.626 m)  07/18/15  (1.626 m)    General appearance: alert, cooperative and appears stated age Head: Normocephalic, without obvious abnormality, atraumatic Neck: no adenopathy, supple, symmetrical, trachea midline and thyroid normal to inspection and palpation Lungs: clear to auscultation bilaterally Breasts: normal appearance, no masses or tenderness, bilateral breast implants Heart: regular rate and rhythm Abdomen: soft, non-tender; bowel sounds normal; no masses,  no organomegaly Extremities: extremities normal, atraumatic, no cyanosis or edema Skin: Skin color, texture, turgor normal. No rashes or lesions Lymph nodes: Cervical, supraclavicular, and axillary nodes normal. No abnormal inguinal nodes palpated Neurologic: Grossly normal   Pelvic: External genitalia:  no lesions              Urethra:  normal appearing urethra with no masses, tenderness or lesions              Bartholins and Skenes: normal                 Vagina: normal appearing vagina with normal color and discharge, no lesions              Cervix: no lesions. IUD string 3 cm               Bimanual Exam:  Uterus:  normal size, contour, position, consistency, mobility, non-tender, anteverted              Adnexa: no mass, fullness, tenderness               Rectovaginal: Confirms               Anus:  normal sphincter tone, no lesions  Chaperone was present for exam.  A:  Well Woman with normal exam  H/O DVT, now off of coumadin, negative thrombophilia w/u. +FH of  clotting  IUD check, doing well  P:   Pap with reflex hpv (patient desires)  Labs and immunizations with primary MD  Discussed breast self exam  Discussed calcium and vit d

## 2015-09-06 NOTE — Telephone Encounter (Signed)
Left message regarding upcoming appointment has been canceled and needs to be rescheduled. °

## 2015-09-06 NOTE — Patient Instructions (Signed)

## 2015-09-07 LAB — IPS PAP TEST WITH REFLEX TO HPV

## 2015-09-11 ENCOUNTER — Encounter: Payer: Self-pay | Admitting: Family Medicine

## 2015-09-15 ENCOUNTER — Ambulatory Visit (INDEPENDENT_AMBULATORY_CARE_PROVIDER_SITE_OTHER): Payer: Managed Care, Other (non HMO) | Admitting: Family Medicine

## 2015-09-15 ENCOUNTER — Encounter: Payer: Self-pay | Admitting: Family Medicine

## 2015-09-15 ENCOUNTER — Other Ambulatory Visit (INDEPENDENT_AMBULATORY_CARE_PROVIDER_SITE_OTHER): Payer: Managed Care, Other (non HMO)

## 2015-09-15 VITALS — BP 128/72 | HR 84 | Ht 64.0 in

## 2015-09-15 DIAGNOSIS — M25552 Pain in left hip: Secondary | ICD-10-CM | POA: Diagnosis not present

## 2015-09-15 DIAGNOSIS — M79605 Pain in left leg: Secondary | ICD-10-CM

## 2015-09-15 NOTE — Progress Notes (Signed)
  Subjective:    CC:  Left hip pain  HPI: Patient is complaining of more of the left hip pain. This seems to be a little bit different than what she was having previously. States that it seemed to start in the groin area when she was lifting. Radiate to the back side as well. Describes it as a sharp pain. Was hurting her with every motion. Seems to be getting better slowly. Denies any swelling. Denies any numbness down the leg. Patient continues to work out and has fitness goals. Patient is working out at least 2 times a day prematurely day of the week. Patient continues to take many over-the-counter medications as well as prescription medications. Patient continues to do protein supplementations. States that the neck is doing relatively well. Patient states when the pain initially started it was 9 out of 10 pain. Seems to be more like 4 out of 10 pain now.  Past medical history, Surgical history, Family history not pertinant except as noted below, Social history, Allergies, and medications have been entered into the medical record, reviewed, and no changes needed.   Review of Systems: No fevers, chills, night sweats, weight loss, chest pain, or shortness of breath.   Objective:   Blood pressure 128/72, pulse 84, height 5\' 4"  (1.626 m), last menstrual period 08/09/2015, SpO2 97 %.  General: Well Developed, well nourished, and in no acute distress.  Neuro: Alert and oriented x3, extra-ocular muscles intact, sensation grossly intact.  HEENT: Normocephalic, atraumatic, pupils equal round reactive to light, neck supple, no masses, no lymphadenopathy, thyroid nonpalpable.  Skin: Warm and dry, no rashes. Cardiac:  no lower extremity edema. Respiratory: Not using accessory muscles, speaking in full sentences. Abdominal: NT, soft Gait: Nonantlagic, good balance and coordination Lymphatic: no lymphadenopathy in neck or axillae on palpation, non tender.  Musculoskeletal: Inspection and palpation of the  right and left upper extremities including the shoulders elbows and wrist are unremarkable with full range of motion and good muscle strength and tone.  Hip: Left ROM IR: 25 Deg with pain and positive grind test, ER: 45 Deg, Flexion: 120 Deg, Extension: 100 Deg, Abduction: 45 Deg, Adduction: 45 Deg Strength IR: 5/5, ER: 5/5, Flexion: 5/5, Extension: 5/5, Abduction: 5/5, Adduction: 5/5 Pelvic alignment unremarkable to inspection and palpation. Standing hip rotation and gait without trendelenburg sign / unsteadiness. Mild discomfort over the piriformis but no tenderness over the greater trochanteric area Positive pain with internal rotation. Mild tightness with Pearlean BrownieFaber No SI joint tenderness and normal minimal SI movement. Contralateral hip unremarkable  MSK US performed of: Left This study was ordered, performed, and interpreted by Terrilee FilesZach Tayshawn Purnell D.O.  Hip: Trochanteric bursa without swelling or effusion. Acetabular labrum does have increasing hypoechoic changes and mild increase in Doppler flow compared to the contralateral side. Femoral neck appears unremarkable without increased power doppler signal along Cortex.  IMPRESSION:  Findings consistent with possible labral tear.   Impression and Recommendations:

## 2015-09-15 NOTE — Progress Notes (Signed)
Pre visit review using our clinic review tool, if applicable. No additional management support is needed unless otherwise documented below in the visit note. 

## 2015-09-15 NOTE — Assessment & Plan Note (Signed)
With patient's findings I am concerned with patient having possible increase in left hip pain. I do think that the labral pathology is likely diagnosis. Patient though is unwilling to decrease her activity at this time. Patient once to continue to work out. Discussed with patient as long as she is not having any giving out on her she can continue to do so but not is what is recommended. We discussed icing as well as anti-inflammatories which she is unable to do secondary to her other comorbidities. We discussed over-the-counter medications that would be safe and effective. We discussed which activities to avoid. We should probably rescan again in 6-12 weeks to make sure that the swelling is going down. If worsening symptoms patient will come back sooner.  Spent  25 minutes with patient face-to-face and had greater than 50% of counseling including as described above in assessment and plan.

## 2015-09-15 NOTE — Patient Instructions (Signed)
Good to see you  Ice is your friend Try splits and decrease cardio then Likely labrum tear noted. Will need to monitor.  Bromelain 2400mg  with each meal with CLA.  See me when you need me or want to scan again.

## 2015-10-04 ENCOUNTER — Institutional Professional Consult (permissible substitution): Payer: Managed Care, Other (non HMO)

## 2015-10-05 ENCOUNTER — Ambulatory Visit (HOSPITAL_BASED_OUTPATIENT_CLINIC_OR_DEPARTMENT_OTHER)
Admission: RE | Admit: 2015-10-05 | Discharge: 2015-10-05 | Disposition: A | Discharge status: Home / Self Care | Payer: Managed Care, Other (non HMO) | Source: Ambulatory Visit | Attending: Hematology & Oncology | Admitting: Hematology & Oncology

## 2015-10-05 ENCOUNTER — Ambulatory Visit (HOSPITAL_BASED_OUTPATIENT_CLINIC_OR_DEPARTMENT_OTHER): Payer: Managed Care, Other (non HMO) | Admitting: Hematology & Oncology

## 2015-10-05 ENCOUNTER — Other Ambulatory Visit (HOSPITAL_BASED_OUTPATIENT_CLINIC_OR_DEPARTMENT_OTHER): Payer: Managed Care, Other (non HMO)

## 2015-10-05 ENCOUNTER — Encounter: Payer: Self-pay | Admitting: Hematology & Oncology

## 2015-10-05 VITALS — BP 118/74 | HR 72 | Temp 97.4°F | Resp 18 | Ht 64.0 in | Wt 130.0 lb

## 2015-10-05 DIAGNOSIS — I82402 Acute embolism and thrombosis of unspecified deep veins of left lower extremity: Secondary | ICD-10-CM

## 2015-10-05 DIAGNOSIS — I82412 Acute embolism and thrombosis of left femoral vein: Secondary | ICD-10-CM

## 2015-10-05 DIAGNOSIS — I82432 Acute embolism and thrombosis of left popliteal vein: Secondary | ICD-10-CM

## 2015-10-05 LAB — COMPREHENSIVE METABOLIC PANEL
ALBUMIN: 3.9 g/dL (ref 3.5–5.0)
ALK PHOS: 55 U/L (ref 40–150)
ALT: 22 U/L (ref 0–55)
AST: 29 U/L (ref 5–34)
Anion Gap: 9 mEq/L (ref 3–11)
BUN: 26.5 mg/dL — AB (ref 7.0–26.0)
CALCIUM: 8.9 mg/dL (ref 8.4–10.4)
CO2: 23 mEq/L (ref 22–29)
Chloride: 106 mEq/L (ref 98–109)
Creatinine: 0.8 mg/dL (ref 0.6–1.1)
EGFR: 88 mL/min/{1.73_m2} — ABNORMAL LOW (ref >90)
Glucose: 106 mg/dl (ref 70–140)
POTASSIUM: 3.7 meq/L (ref 3.5–5.1)
Sodium: 138 mEq/L (ref 136–145)
Total Bilirubin: <0.3 mg/dL (ref 0.20–1.20)
Total Protein: 7.1 g/dL (ref 6.4–8.3)

## 2015-10-05 LAB — CBC WITH DIFFERENTIAL (CANCER CENTER ONLY)
BASO#: 0 10*3/uL (ref 0.0–0.2)
BASO%: 0.7 % (ref 0.0–2.0)
EOS%: 5.5 % (ref 0.0–7.0)
Eosinophils Absolute: 0.2 10*3/uL (ref 0.0–0.5)
HEMATOCRIT: 35.3 % (ref 34.8–46.6)
HGB: 12.4 g/dL (ref 11.6–15.9)
LYMPH#: 1.1 10*3/uL (ref 0.9–3.3)
LYMPH%: 24.5 % (ref 14.0–48.0)
MCH: 31.9 pg (ref 26.0–34.0)
MCHC: 35.1 g/dL (ref 32.0–36.0)
MCV: 91 fL (ref 81–101)
MONO#: 0.7 10*3/uL (ref 0.1–0.9)
MONO%: 15.2 % — AB (ref 0.0–13.0)
NEUT#: 2.4 10*3/uL (ref 1.5–6.5)
NEUT%: 54.1 % (ref 39.6–80.0)
PLATELETS: 252 10*3/uL (ref 145–400)
RBC: 3.89 10*6/uL (ref 3.70–5.32)
RDW: 11.8 % (ref 11.1–15.7)
WBC: 4.4 10*3/uL (ref 3.9–10.0)

## 2015-10-05 NOTE — Progress Notes (Signed)
Hematology and Oncology Follow Up Visit  Michele CurbMargaret Turner [id number] 1976/01/09 39 y.o. 10/05/2015   Principle Diagnosis:   Left lower extremity DVT-idiopathic  Current Therapy:    Aspirin 162 mg by mouth daily     Interim History:  Michele Turner is back for follow-up. We first saw her back in late January. The point time, we got her off Coumadin. We were not able to do some of the hypercoagulable studies. Now, we can and she is off Coumadin.  We did do a repeat Doppler. This is of the left leg. This showed a very small residual nonocclusive thrombus in the distal left femoral vein. She had recanalization of the left femoral vein.  She does have a compression stocking. She wears this. She works out quite a bit. She travels quite a bit.  She is on aspirin. She's a well on baby aspirin. Michele Turner she is not on any estrogens. She does not smoke. She has no diabetes.  Overall, her performance status is ECOG 0.  Medications:  Current outpatient prescriptions:  .  ALPRAZolam (XANAX) 0.25 MG tablet, Take 1 tablet (0.25 mg total) by mouth 3 (three) times daily as needed., Disp: 30 tablet, Rfl: 0 .  amphetamine-dextroamphetamine (ADDERALL) 10 MG tablet, Take 1 tablet by mouth daily as needed., Disp: , Rfl:  .  BACTROBAN 2 % ointment, , Disp: , Rfl:  .  buPROPion (WELLBUTRIN) 75 MG tablet, Take 75 mg by mouth 2 (two) times daily., Disp: , Rfl: 5 .  cyclobenzaprine (FLEXERIL) 10 MG tablet, Take 1 tablet (10 mg total) by mouth 3 (three) times daily as needed for muscle spasms., Disp: 30 tablet, Rfl: 0 .  diazepam (VALIUM) 5 MG tablet, Take 1 tablet (5 mg total) by mouth every 12 (twelve) hours as needed for anxiety., Disp: 30 tablet, Rfl: 1 .  levonorgestrel (MIRENA) 20 MCG/24HR IUD, 1 each by Intrauterine route once.  , Disp: , Rfl:  .  LEXAPRO 10 MG tablet, Take 1 tablet (10 mg total) by mouth daily. Office visit due now, Disp: 90 tablet, Rfl: 0 .  lisdexamfetamine (VYVANSE) 70 MG capsule,  Take 1 capsule (70 mg total) by mouth daily., Disp: 30 capsule, Rfl: 0 .  naproxen sodium (ANAPROX) 220 MG tablet, Take 220 mg by mouth 2 (two) times daily with a meal., Disp: , Rfl:  .  nitroGLYCERIN (NITRODUR - DOSED IN MG/24 HR) 0.2 mg/hr patch, 1/4 patch daily, Disp: 30 patch, Rfl: 1 .  omeprazole-sodium bicarbonate (ZEGERID) 40-1100 MG per capsule, Take 1 capsule by mouth daily.  , Disp: , Rfl:  .  URELLE (URELLE/URISED) 81 MG TABS, Take 1 tablet (81 mg total) by mouth as directed., Disp: 120 each, Rfl: 5 .  VALTREX 1 G tablet, TAKE AS DIRECTED., Disp: 30 tablet, Rfl: 11  Allergies: No Known Allergies  Past Medical History, Surgical history, Social history, and Family History were reviewed and updated.  Review of Systems: As above  Physical Exam:  height is 5\' 4"  (1.626 m) and weight is 130 lb (58.968 kg). Her oral temperature is 97.4 F (36.3 C). Her blood pressure is 118/74 and her pulse is 72. Her respiration is 18.   Wt Readings from Last 3 Encounters:  10/05/15 130 lb (58.968 kg)  09/06/15 126 lb (57.153 kg)  08/07/15 133 lb (60.328 kg)     Head and neck exam shows no ocular or oral lesions. She has no palpable cervical or supraclavicular lymph nodes. Lungs are clear. Cardiac exam regular  rate and rhythm with a normal S1 and S2. She has no murmurs, rubs or bruits. Abdomen is soft. Shows good bowel sounds. There is no fluid wave. There is no palpable liver or spleen tip. Back exam shows no tenderness over the spine, ribs or hips. Extremities shows no clubbing, cyanosis or edema. She has no palpable venous cord in the legs. She has a negative Homans sign in the legs. She has good pulses in her distal extremities. Skin exam shows no rashes, ecchymoses or petechia. Neurological exam shows no focal neurological deficits.  Lab Results  Component Value Date   WBC 4.4 10/05/2015   HGB 12.4 10/05/2015   HCT 35.3 10/05/2015   MCV 91 10/05/2015   PLT 252 10/05/2015     Chemistry       Component Value Date/Time   NA 138 10/05/2015 0956   NA 137 06/08/2013 1219   K 3.7 10/05/2015 0956   K 3.8 06/08/2013 1219   CL 102 06/08/2013 1219   CO2 23 10/05/2015 0956   CO2 26 06/08/2013 1219   BUN 26.5* 10/05/2015 0956   BUN 11 06/08/2013 1219   CREATININE 0.8 10/05/2015 0956   CREATININE 0.7 06/08/2013 1219   CREATININE 0.70 05/22/2012 1651      Component Value Date/Time   CALCIUM 8.9 10/05/2015 0956   CALCIUM 9.1 06/08/2013 1219   ALKPHOS 55 10/05/2015 0956   ALKPHOS 35* 06/08/2013 1219   AST 29 10/05/2015 0956   AST 32 06/08/2013 1219   ALT 22 10/05/2015 0956   ALT 20 06/08/2013 1219   BILITOT <0.30 10/05/2015 0956   BILITOT 0.7 06/08/2013 1219         Impression and Plan: Michele Turner is A 40 year old white female. She had a hepatic thrombus in the left leg. There is still a residual thrombus which is chronic. I do not think this will ever cause her problems. It is not occlusive. She has recanalization of the left femoral vein at the site of the thrombus.  I think as long she is on aspirin, she should do well.  We will see what her hypercoagulable studies show.  At this point, I will plan to get her back in 6 months. I did this is reasonable.  I told her make sure she wears her compression stocking whenever she travels. She also needs to make sure she drinks quite a lot of liquid.   Josph Macho, MD 3/30/20176:25 PM

## 2015-10-06 ENCOUNTER — Ambulatory Visit (INDEPENDENT_AMBULATORY_CARE_PROVIDER_SITE_OTHER): Payer: Managed Care, Other (non HMO) | Admitting: *Deleted

## 2015-10-06 DIAGNOSIS — Z111 Encounter for screening for respiratory tuberculosis: Secondary | ICD-10-CM | POA: Diagnosis not present

## 2015-10-06 LAB — PROTEIN C ACTIVITY: Protein C-Functional: 98 % (ref 73–180)

## 2015-10-06 LAB — LUPUS ANTICOAGULANT PANEL
PTT-LA: 26.4 s (ref 0.0–43.6)
dRVVT: 32.7 s (ref 0.0–44.0)

## 2015-10-06 LAB — PROTEIN C, TOTAL: Protein C Antigen: 70 % (ref 60–150)

## 2015-10-06 LAB — D-DIMER, QUANTITATIVE: D-DIMER: 0.25 mg/L FEU (ref 0.00–0.49)

## 2015-10-06 LAB — PROTEIN S, ANTIGEN, FREE: PROTEIN S AG FREE: 94 % (ref 57–157)

## 2015-10-06 LAB — CARDIOLIPIN ANTIBODIES, IGG, IGM, IGA

## 2015-10-06 LAB — PROTEIN S ACTIVITY: Protein S-Functional: 106 % (ref 63–140)

## 2015-10-06 NOTE — Progress Notes (Signed)
Pre visit review using our clinic review tool, if applicable. No additional management support is needed unless otherwise documented below in the visit note.  Pt here for TB skin test, ok per Dr. Laury AxonLowne. Placed LFA. Pt instructed to return Monday before 11am to have test read.   Starla Linkarolyn J Myrth Dahan, RN

## 2015-10-09 ENCOUNTER — Encounter: Payer: Self-pay | Admitting: *Deleted

## 2015-10-09 LAB — TB SKIN TEST
INDURATION: 0 mm
TB Skin Test: NEGATIVE

## 2015-10-12 ENCOUNTER — Encounter: Payer: Self-pay | Admitting: *Deleted

## 2015-10-19 ENCOUNTER — Ambulatory Visit (INDEPENDENT_AMBULATORY_CARE_PROVIDER_SITE_OTHER): Payer: Managed Care, Other (non HMO) | Admitting: Family Medicine

## 2015-10-19 ENCOUNTER — Encounter: Payer: Self-pay | Admitting: Family Medicine

## 2015-10-19 ENCOUNTER — Other Ambulatory Visit (INDEPENDENT_AMBULATORY_CARE_PROVIDER_SITE_OTHER): Payer: Managed Care, Other (non HMO)

## 2015-10-19 VITALS — BP 120/70 | HR 84 | Ht 64.0 in | Wt 125.0 lb

## 2015-10-19 DIAGNOSIS — G5631 Lesion of radial nerve, right upper limb: Secondary | ICD-10-CM

## 2015-10-19 DIAGNOSIS — F429 Obsessive-compulsive disorder, unspecified: Secondary | ICD-10-CM

## 2015-10-19 DIAGNOSIS — M25521 Pain in right elbow: Secondary | ICD-10-CM

## 2015-10-19 DIAGNOSIS — T148XXA Other injury of unspecified body region, initial encounter: Secondary | ICD-10-CM

## 2015-10-19 DIAGNOSIS — T148 Other injury of unspecified body region: Secondary | ICD-10-CM | POA: Diagnosis not present

## 2015-10-19 DIAGNOSIS — X503XXA Overexertion from repetitive movements, initial encounter: Secondary | ICD-10-CM

## 2015-10-19 NOTE — Assessment & Plan Note (Signed)
Noted. Patient will try to avoid certain activities. Patient will avoid wrist extension. We discussed topical anti-inflammatories and icing. Patient will come back and see me again in 3-4 weeks to make sure it comes down.

## 2015-10-19 NOTE — Assessment & Plan Note (Signed)
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 We'll continue to get worse if she is not careful. I believe the patient was going to burnout or cause significant assistant earlier at some point. Patient states that she does have a therapist and she will go talk to them on a regular basis.

## 2015-10-19 NOTE — Assessment & Plan Note (Signed)
Patient is having severe obsessive-compulsive disorder at this time. Is on 2 different medications and we did discussed possibly changing. Patient does not want to go up on the medications As she is concern of weight gain. Patient will go talk to her psychiatrist as well as her primary care provider to see if they have any input. At this moment I discussed with her that I think that until she stops working on a regular basis she is going to continue to have these injuries. Patient is not recovering appropriately and will continue to have difficulties. Patient is going to take this into consideration. We will continue to monitor.

## 2015-10-19 NOTE — Progress Notes (Signed)
    Subjective:    CC:  Left hip pain Elbow pain  HPI: Patient is complaining of more of the left hip pain. States he continues to give her some difficulty. Patient has been seen by a chiropractor for a functional exam. Patient states that she felt this and is going to have significant number of treatments but it is going to be very costly and is wondering if this is worth it. Patient continues to workout 11-14 times a week patient is wondering what she continues to get injured. Patient continues to compare herself to other individuals makes it difficult for her to make strides. Patient states that she is been looking at different fitness magazines and has changed her diet 4 times this week. Patient states that this concern keeps her up at night. Past medical history is significant for obsessive-compulsive disorder as well as alcohol abuse.   Patient states that she is having new elbow pain on the right side. Patient was doing a lift and had severe pain in the forearm. Since then can not lift significant heavy things. Feels like it radiates to her hand. Has been known to have carpal tunnel. Has not slowed her down.Refill severity of pain a 5 out of 10.  Past medical history, Surgical history, Family history not pertinant except as noted below, Social history, Allergies, and medications have been entered into the medical record, reviewed, and no changes needed.   Review of Systems: No fevers, chills, night sweats, weight loss, chest pain, or shortness of breath.   Objective:   Last menstrual period 09/06/2015.  General: Well Developed, well nourished, and in no acute distress.  Neuro: Alert and oriented x3, extra-ocular muscles intact, sensation grossly intact.  HEENT: Normocephalic, atraumatic, pupils equal round reactive to light, neck supple, no masses, no lymphadenopathy, thyroid nonpalpable.  Skin: Warm and dry, no rashes. Cardiac:  no lower extremity edema. Respiratory: Not using  accessory muscles, speaking in full sentences. Abdominal: NT, soft Gait: Nonantlagic, good balance and coordination Lymphatic: no lymphadenopathy in neck or axillae on palpation, non tender.  Musculoskeletal: Inspection and palpation of the right and left upper extremities including the shoulders and wrist are unremarkable with full range of motion and good muscle strength and tone.  Elbow: right Unremarkable to inspection. Range of motion full pronation, supination, flexion, extension. Strength is full to all of the above directions Stable to varus, valgus stress. Negative moving valgus stress test.  severe tenderness over the posterior interosseous nerve in the muscle belly. Ulnar nerve does not sublux.  Positive Tinel's Contralateral elbow unremarkable   Musculoskeletal ultrasound was performed and interpreted by Terrilee FilesZach Robel Wuertz D.O.   Elbow:  right Lateral epicondyle and common extensor tendon origin visualized.  No edema, effusions, or avulsions seen. Patient does have severe inflammation of the posterior interosseous nerve within the musculature of the extensor common muscle Radial head unremarkable and located in annular ligament Medial epicondyle and common flexor tendon origin visualized.  No edema, effusions, or avulsions seen. Ulnar nerve in cubital tunnel unremarkable. Olecranon and triceps insertion visualized and unremarkable without edema, effusion, or avulsion.  No signs olecranon bursitis.   IMPRESSION:   Posterior interosseous nerve entrapment    Impression and Recommendations:

## 2015-10-19 NOTE — Patient Instructions (Addendum)
Good to see you  Talk to your PCP and ask about effexor or Cymbalta instead for medication  For the wrist avoid extension as much as possible pennsaid pinkie amount topically 2 times daily as needed.   Ice 20 minutes 2 times daily. Usually after activity and before bed. Wear the wrist brace to bed for next 2 weeks.  See em again in 4 weeks if not gone.  We have to change your mindset!  You can do it and I believe in you

## 2015-10-19 NOTE — Progress Notes (Signed)
Pre visit review using our clinic review tool, if applicable. No additional management support is needed unless otherwise documented below in the visit note. 

## 2015-10-21 ENCOUNTER — Encounter: Payer: Self-pay | Admitting: Hematology & Oncology

## 2015-11-06 ENCOUNTER — Other Ambulatory Visit (HOSPITAL_BASED_OUTPATIENT_CLINIC_OR_DEPARTMENT_OTHER): Payer: Managed Care, Other (non HMO)

## 2015-11-06 ENCOUNTER — Encounter: Payer: Self-pay | Admitting: Family

## 2015-11-06 ENCOUNTER — Ambulatory Visit (HOSPITAL_BASED_OUTPATIENT_CLINIC_OR_DEPARTMENT_OTHER): Payer: Managed Care, Other (non HMO) | Admitting: Family

## 2015-11-06 VITALS — BP 121/72 | HR 85 | Temp 97.9°F | Resp 18 | Ht 64.0 in | Wt 125.0 lb

## 2015-11-06 DIAGNOSIS — I82402 Acute embolism and thrombosis of unspecified deep veins of left lower extremity: Secondary | ICD-10-CM

## 2015-11-06 LAB — CMP (CANCER CENTER ONLY)
ALBUMIN: 3.5 g/dL (ref 3.3–5.5)
ALK PHOS: 51 U/L (ref 26–84)
ALT: 24 U/L (ref 10–47)
AST: 29 U/L (ref 11–38)
BILIRUBIN TOTAL: 0.8 mg/dL (ref 0.20–1.60)
BUN, Bld: 22 mg/dL (ref 7–22)
CALCIUM: 8.7 mg/dL (ref 8.0–10.3)
CO2: 29 meq/L (ref 18–33)
CREATININE: 0.9 mg/dL (ref 0.6–1.2)
Chloride: 102 mEq/L (ref 98–108)
Glucose, Bld: 104 mg/dL (ref 73–118)
Potassium: 3.9 mEq/L (ref 3.3–4.7)
SODIUM: 138 meq/L (ref 128–145)
Total Protein: 7.2 g/dL (ref 6.4–8.1)

## 2015-11-06 LAB — CBC WITH DIFFERENTIAL (CANCER CENTER ONLY)
BASO#: 0 10*3/uL (ref 0.0–0.2)
BASO%: 0.7 % (ref 0.0–2.0)
EOS%: 7.7 % — AB (ref 0.0–7.0)
Eosinophils Absolute: 0.4 10*3/uL (ref 0.0–0.5)
HEMATOCRIT: 39.2 % (ref 34.8–46.6)
HEMOGLOBIN: 13.7 g/dL (ref 11.6–15.9)
LYMPH#: 1.4 10*3/uL (ref 0.9–3.3)
LYMPH%: 25.2 % (ref 14.0–48.0)
MCH: 31.7 pg (ref 26.0–34.0)
MCHC: 34.9 g/dL (ref 32.0–36.0)
MCV: 91 fL (ref 81–101)
MONO#: 0.6 10*3/uL (ref 0.1–0.9)
MONO%: 11.2 % (ref 0.0–13.0)
NEUT%: 55.2 % (ref 39.6–80.0)
NEUTROS ABS: 3 10*3/uL (ref 1.5–6.5)
Platelets: 249 10*3/uL (ref 145–400)
RBC: 4.32 10*6/uL (ref 3.70–5.32)
RDW: 11.9 % (ref 11.1–15.7)
WBC: 5.4 10*3/uL (ref 3.9–10.0)

## 2015-11-06 MED ORDER — RIVAROXABAN 20 MG PO TABS: 20.0000 mg | ORAL_TABLET | Freq: Every day | ORAL | Status: DC

## 2015-11-06 NOTE — Progress Notes (Signed)
Hematology and Oncology Follow Up Visit  Michele CurbMargaret Turner [id number] 1976/05/24 40 y.o. 11/06/2015   Principle Diagnosis:  Left lower extremity DVT-idiopathic  Current Therapy:   Aspirin 162 mg by mouth daily    Interim History:  Michele Turner is here today for follow-up and to discuss anticoagulation for a trip overseas at the end of the month. She will be flying to [place] and then going on a cruise for a week. She is concerned with her past history of DVT that she needs more than aspirin for her trip. Her mother also has a history of PE after flying overseas.  She is currently taking 2 baby aspirin daily. She wear a compression stocking as needed.  She has post phlebitic pain in the left leg at times. 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 No new/acute thrombus.  She stays well hydrated with water and eats a clean healthy diet. She works out on a regular basis.  No fever, chills, n/v, cough, rash, dizziness, SOB, chest pain, palpitations, abdominal pain or changes in bowel or bladder habits.  She has some mild numbness and tingling in her right hand due to carpal tunnel syndrome. No other tenderness, numbness or tingling in her extremities. No swelling in her extremities at this time.   Medications:    Medication List       This list is accurate as of: 11/06/15  9:33 AM.  Always use your most recent med list.               ALPRAZolam 0.25 MG tablet  Commonly known as:  XANAX  Take 1 tablet (0.25 mg total) by mouth 3 (three) times daily as needed.     amphetamine-dextroamphetamine 10 MG tablet  Commonly known as:  ADDERALL  Take 1 tablet by mouth daily as needed.     BACTROBAN 2 %  Generic drug:  mupirocin ointment     buPROPion 75 MG tablet  Commonly known as:  WELLBUTRIN  Take 75 mg by mouth 2 (two) times daily.     diazepam 5 MG tablet  Commonly known as:  VALIUM  Take 1 tablet (5 mg total) by mouth  every 12 (twelve) hours as needed for anxiety.     levonorgestrel 20 MCG/24HR IUD  Commonly known as:  MIRENA  1 each by Intrauterine route once.     LEXAPRO 10 MG tablet  Generic drug:  escitalopram  Take 1 tablet (10 mg total) by mouth daily. Office visit due now     lisdexamfetamine 70 MG capsule  Commonly known as:  VYVANSE  Take 1 capsule (70 mg total) by mouth daily.     omeprazole-sodium bicarbonate 40-1100 MG capsule  Commonly known as:  ZEGERID  Take 1 capsule by mouth daily.     URELLE 81 MG Tabs tablet  Take 1 tablet (81 mg total) by mouth as directed.     VALTREX 1000 MG tablet  Generic drug:  valACYclovir  TAKE AS DIRECTED.        Allergies: No Known Allergies  Past Medical History, Surgical history, Social history, and Family History were reviewed and updated.  Review of Systems: All other 10 point review of systems is negative.   Physical Exam:  height is 5\' 4"  (1.626 m) and weight is 125 lb (56.7 kg). Her oral temperature is 97.9 F (36.6 C). Her blood pressure is 121/72 and her pulse is 85. Her  respiration is 18.   Wt Readings from Last 3 Encounters:  11/06/15 125 lb (56.7 kg)  10/19/15 125 lb (56.7 kg)  10/05/15 130 lb (58.968 kg)    Ocular: Sclerae unicteric, pupils equal, round and reactive to light Ear-nose-throat: Oropharynx clear, dentition fair Lymphatic: No cervical supraclavicular or axillary adenopathy Lungs no rales or rhonchi, good excursion bilaterally Heart regular rate and rhythm, no murmur appreciated Abd soft, nontender, positive bowel sounds, no liver or spleen tip palpated on exam, no fluid wave MSK no focal spinal tenderness, no joint edema Neuro: non-focal, well-oriented, appropriate affect Breasts: Deferred  Lab Results  Component Value Date   WBC 5.4 11/06/2015   HGB 13.7 11/06/2015   HCT 39.2 11/06/2015   MCV 91 11/06/2015   PLT 249 11/06/2015   Lab Results  Component Value Date   FERRITIN 58.0 06/08/2014    IRON 89 06/08/2014   Lab Results  Component Value Date   RBC 4.32 11/06/2015   No results found for: KPAFRELGTCHN, LAMBDASER, KAPLAMBRATIO No results found for: IGGSERUM, IGA, IGMSERUM No results found for: Marda Stalker, SPEI   Chemistry      Component Value Date/Time   NA 138 10/05/2015 0956   NA 137 06/08/2013 1219   K 3.7 10/05/2015 0956   K 3.8 06/08/2013 1219   CL 102 06/08/2013 1219   CO2 23 10/05/2015 0956   CO2 26 06/08/2013 1219   BUN 26.5* 10/05/2015 0956   BUN 11 06/08/2013 1219   CREATININE 0.8 10/05/2015 0956   CREATININE 0.7 06/08/2013 1219   CREATININE 0.70 05/22/2012 1651      Component Value Date/Time   CALCIUM 8.9 10/05/2015 0956   CALCIUM 9.1 06/08/2013 1219   ALKPHOS 55 10/05/2015 0956   ALKPHOS 35* 06/08/2013 1219   AST 29 10/05/2015 0956   AST 32 06/08/2013 1219   ALT 22 10/05/2015 0956   ALT 20 06/08/2013 1219   BILITOT <0.30 10/05/2015 0956   BILITOT 0.7 06/08/2013 1219     Impression and Plan: Michele Turner is A 40 yo white female with an ideopathic thrombus of the left leg. There is still a residual nonocclusive thrombus which is chronic. She has done well on 2 baby aspirin daily and is make sure to stay well hydrated and wear a compression stocking for support.  She is flying to [place] at the end of the month and would feel more comfortable with something more for anticoagulation considering her personal and familial history of blood clots.  We will have her start Xarleto 10 mg daily PO the day before she leaves on her trip and have her stop the day after. She will stop the baby aspirin the day before she starts Xarelto and then restart the day after she stops the Xarelto once she is home.  We will continue to follow-up with her as needed.  She will contact us with any questions or concerns. We can certainly see her sooner if need be.   Verdie Mosher, NP 5/1/20179:33 AM

## 2015-11-07 LAB — D-DIMER, QUANTITATIVE (NOT AT ARMC): D-DIMER: 0.25 mg{FEU}/L (ref 0.00–0.49)

## 2015-11-29 ENCOUNTER — Encounter: Payer: Self-pay | Admitting: Family Medicine

## 2015-11-29 ENCOUNTER — Ambulatory Visit (INDEPENDENT_AMBULATORY_CARE_PROVIDER_SITE_OTHER): Payer: Managed Care, Other (non HMO) | Admitting: Family Medicine

## 2015-11-29 ENCOUNTER — Other Ambulatory Visit: Payer: Self-pay

## 2015-11-29 VITALS — BP 124/70 | HR 77 | Ht 64.0 in | Wt 125.0 lb

## 2015-11-29 DIAGNOSIS — M25552 Pain in left hip: Secondary | ICD-10-CM

## 2015-11-29 DIAGNOSIS — M25521 Pain in right elbow: Secondary | ICD-10-CM | POA: Diagnosis not present

## 2015-11-29 DIAGNOSIS — M79601 Pain in right arm: Secondary | ICD-10-CM

## 2015-11-29 MED ORDER — PREDNISONE 50 MG PO TABS: 50.0000 mg | ORAL_TABLET | Freq: Every day | ORAL | Status: DC

## 2015-11-29 MED ORDER — DIAZEPAM 10 MG PO TABS: 10.0000 mg | ORAL_TABLET | Freq: Two times a day (BID) | ORAL | Status: DC | PRN

## 2015-11-29 NOTE — Patient Instructions (Addendum)
Have a great trip! You will do fine.  \pennsaid pinkie amount topically 2 times daily as needed.  Try compression and see if that helps the elbow Valium for the plane Tennis ball on the plane as well to help keep the muscle from getting tight Take valium 30 minutes before boarding.  Prednisone for 5 days if pain gets too severe.  See me again when you get back.

## 2015-11-29 NOTE — Assessment & Plan Note (Signed)
Stable no change in medications.

## 2015-11-29 NOTE — Progress Notes (Signed)
    Subjective:    CC:  Right elbow pain  HPI: Patient is complaining of more of the left hip pain. States he continues to give her some difficulty. Patient is been doing less working out and states that it does get better when she does this. Patient interest to work out 3 times a day and unfortunately that seems to make it worse. Goes away after 1-2 days of rest. Thinks it is seems to be improving.   Patient states that she is having new elbow pain on the right side. Patient was found to have more of a posterior osseous nerve entrapment. Patient states that it is kind of migrating and now seems to encompass the entire elbow. Still has full range of motion. Sometimes has days when it seems to be better and then days when it is worse. Patient states that it can affect daily activities. Patient is concerned she is going out of the country for the next 3 months. Once a note what can be done.  Past medical history, Surgical history, Family history not pertinant except as noted below, Social history, Allergies, and medications have been entered into the medical record, reviewed, and no changes needed.   Review of Systems: No fevers, chills, night sweats, weight loss, chest pain, or shortness of breath.   Objective:   Blood pressure 124/70, pulse 77, height 5\' 4"  (1.626 m), weight 125 lb (56.7 kg), SpO2 98 %.  General: Well Developed, well nourished, and in no acute distress.  Neuro: Alert and oriented x3, extra-ocular muscles intact, sensation grossly intact.  HEENT: Normocephalic, atraumatic, pupils equal round reactive to light, neck supple, no masses, no lymphadenopathy, thyroid nonpalpable.  Skin: Warm and dry, no rashes. Cardiac:  no lower extremity edema. Respiratory: Not using accessory muscles, speaking in full sentences. Abdominal: NT, soft Gait: Nonantlagic, good balance and coordination Lymphatic: no lymphadenopathy in neck or axillae on palpation, non tender.  Musculoskeletal:  Inspection and palpation of the right and left upper extremities including the shoulders and wrist are unremarkable with full range of motion and good muscle strength and tone.  Elbow: right Unremarkable to inspection. Range of motion full pronation, supination, flexion, extension. Strength is full to all of the above directions Stable to varus, valgus stress. Negative moving valgus stress test.  severe tenderness over the posterior interosseous nerve in the muscle belly. Still has pain that seems to be generalized around the elbow including the tricep lateral and medial epicondylitis. Possibly somewhat worse. Ulnar nerve does not sublux.  Positive Tinel's Contralateral elbow unremarkable Wrist exam is a negative carpal tunnel.  Musculoskeletal ultrasound was performed and interpreted by Terrilee FilesZach Ammie Warrick D.O.   Elbow:  right Lateral epicondyle and common extensor tendon origin Radial head unremarkable and located in annular ligament Medial epicondyle and common flexor tendon origin visualized.  No edema, effusions, or avulsions seen. Ulnar nerve in cubital tunnel unremarkable. Patient though on the lateral aspect of the elbow near the elbow joint and at the arcade of frosche to have significant increasing hypoechoic changes as well as increasing Doppler flow. Questionable nerve impingement noted in the area. Difficult to assess.  IMPRESSION:   Inflammation atarcade froshe and likely impingement. Of PIN    Impression and Recommendations:

## 2015-11-29 NOTE — Assessment & Plan Note (Signed)
Patient's elbow pain I think is secondary to an impingement still. We did discuss something such as gabapentin and can be beneficial which patient declined. Patient is concerned that some of this is secondary distress and was given Valium for her trip. Patient knows not to abuse the medication. This woman not a long-term medication. Discussed with patient about question will injection which patient declined. She will try topical anti-inflammatories. If worsening pain on her trip she will take the prednisone. Patient come back and continues to have difficulty will consider further advanced imaging such as an MRI. I'm hoping that this will not be necessary. Did discuss with patient not quite a fair amount of length.  Spent  25 minutes with patient face-to-face and had greater than 50% of counseling including as described above in assessment and plan.

## 2016-01-12 ENCOUNTER — Ambulatory Visit (INDEPENDENT_AMBULATORY_CARE_PROVIDER_SITE_OTHER): Payer: Managed Care, Other (non HMO) | Admitting: Family Medicine

## 2016-01-12 ENCOUNTER — Encounter: Payer: Self-pay | Admitting: Family Medicine

## 2016-01-12 ENCOUNTER — Other Ambulatory Visit: Payer: Self-pay

## 2016-01-12 VITALS — BP 104/72 | HR 94

## 2016-01-12 DIAGNOSIS — M25521 Pain in right elbow: Secondary | ICD-10-CM

## 2016-01-12 DIAGNOSIS — G5631 Lesion of radial nerve, right upper limb: Secondary | ICD-10-CM | POA: Diagnosis not present

## 2016-01-12 NOTE — Progress Notes (Signed)
Pre visit review using our clinic review tool, if applicable. No additional management support is needed unless otherwise documented below in the visit note. 

## 2016-01-12 NOTE — Assessment & Plan Note (Signed)
Worsening symptoms.Patient was given an injection tolerated the procedure fairly well. We discussed icing regimen and home exercises. Discussed which activities to do in which ones to avoid. Encourage patient to possibly doing more lower body and avoid as much excessive working out with poor recovery. Encourage patient to try and nitroglycerin patches in this area as well. Decline formal physical therapy. Follow-up again in 4-6 weeks.

## 2016-01-12 NOTE — Progress Notes (Signed)
    Subjective:    CC:  Right elbow pain  HPI:    Patient states that she is having new elbow pain on the right side. 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 States that that seems to be better but unfortunately now having more radiation going down to her hands. Denies any weakness. Patient is still working out multiple days a week. Patient still can do the workouts but states that it is very painful. Patient states that if anything it seems to be worsening. Does not know what else she can potentially do at this time. Had been wearing a brace around the elbow on a more basis but did not wear the wrist brace like she was supposed to be doing.  Past medical history, Surgical history, Family history not pertinant except as noted below, Social history, Allergies, and medications have been entered into the medical record, reviewed, and no changes needed.   Review of Systems: No fevers, chills, night sweats, weight loss, chest pain, or shortness of breath.   Objective:   Blood pressure 104/72, pulse 94, SpO2 99 %.  General: Well Developed, well nourished, and in no acute distress.  Neuro: Alert and oriented x3, extra-ocular muscles intact, sensation grossly intact.  HEENT: Normocephalic, atraumatic, pupils equal round reactive to light, neck supple, no masses, no lymphadenopathy, thyroid nonpalpable.  Skin: Warm and dry, no rashes. Cardiac:  no lower extremity edema. Respiratory: Not using accessory muscles, speaking in full sentences. Abdominal: NT, soft Gait: Nonantlagic, good balance and coordination Lymphatic: no lymphadenopathy in neck or axillae on palpation, non tender.  Musculoskeletal: Inspection and palpation of the right and left upper extremities including the shoulders and wrist are unremarkable with full range of motion and good muscle strength and tone.  Elbow:  right Unremarkable to inspection. Range of motion full pronation, supination, flexion, extension. Strength is full to all of the above directions Stable to varus, valgus stress. Negative moving valgus stress test.  severe tenderness over the posterior interosseous nerve still notedmild pain over the lateral epicondylar region less pain than previous exam over the medial epicondylar region and the triceps insertion. Ulnar nerve does not sublux. Positive pain with percussion the posterior interosseous nerve Contralateral elbow unremarkable Wrist exam is a negative carpal tunnel.  Procedure: Real-time Ultrasound Guided Injection of right posterior osseous nerve sheath Device: GE Logiq E  Ultrasound guided injection is preferred based studies that show increased duration, increased effect, greater accuracy, decreased procedural pain, increased response rate, and decreased cost with ultrasound guided versus blind injection.  Verbal informed consent obtained.  Time-out conducted.  Noted no overlying erythema, induration, or other signs of local infection.  Skin prepped in a sterile fashion.  Local anesthesia: Topical Ethyl chloride.  With sterile technique and under real time ultrasound guidance:  The 25-gauge half-inch needle njected with a total of 0.5 mL of 0.5% Marcaine and 0.5 mL of Kenalog 40 mg/dL. Completed without difficulty  Pain immediately resolved suggesting accurate placement of the medication.  Advised to call if fevers/chills, erythema, induration, drainage, or persistent bleeding.  Images permanently stored and available for review in the ultrasound unit.  Impression: Technically successful ultrasound guided injection.    Impression and Recommendations:

## 2016-01-12 NOTE — Patient Instructions (Signed)
Good to see you  Ice is your friend Nitro 1/4 patch again daily  Exercises on your leg days.  I hope the injection takes care of it See e again in 4 weeks to make sure it is gone.

## 2016-03-15 ENCOUNTER — Other Ambulatory Visit: Payer: Self-pay | Admitting: Family

## 2016-03-15 DIAGNOSIS — I82402 Acute embolism and thrombosis of unspecified deep veins of left lower extremity: Secondary | ICD-10-CM

## 2016-03-18 ENCOUNTER — Ambulatory Visit: Payer: Managed Care, Other (non HMO) | Admitting: Hematology & Oncology

## 2016-03-18 ENCOUNTER — Other Ambulatory Visit: Payer: Managed Care, Other (non HMO)

## 2016-03-23 NOTE — Progress Notes (Signed)
Subjective:    CC:  Left hip pain Follow-up  HPI: Patient is complaining of more of the left hip pain. Has been seen for this previously. Patient has had numerous pains. Patient states that unfortunately she is having continued elbow pain, as well as hip pain. States that she continues to try to work out but finds it difficult to continue to do so on a regular basis. Patient has been doing icing regimen. Discussed the exercises she can doing has been more of a high impact exercises. Patient states that she continues to have pain that seems to be out of proportion for some at her age. Concern with her family history of psoriatic arthritis.  Past Medical History:  Diagnosis Date  . Anxiety   . DVT (deep venous thrombosis) (HCC) 12/26/08   on OCP - YASMIN  . GERD (gastroesophageal reflux disease)   . Hiatal hernia    Past Surgical History:  Procedure Laterality Date  . BREAST ENHANCEMENT SURGERY    . INTRAUTERINE DEVICE INSERTION  02/02/09, 08/19/13   Mirena   Social History  Substance Use Topics  . Smoking status: Never Smoker  . Smokeless tobacco: Never Used  . Alcohol use No   No Known Allergies Family History  Problem Relation Age of Onset  . Asthma    . Hypertension    . Hyperlipidemia    . Breast cancer    . Colon cancer Paternal Grandmother     and paternal great grandfather  . Hypertension Mother   . Hyperlipidemia Mother   . Heart disease Mother   . Asthma Mother   . Pulmonary embolism Mother   . Osteopenia Mother   . Cancer Father     prostate  . Heart Problems Maternal Grandmother     born with hole in heart, now has pacemaker  . Osteoporosis Maternal Grandmother   . Diabetes Maternal Grandfather      Past medical history, Surgical history, Family history not pertinant except as noted below, Social history, Allergies, and medications have been entered into the medical record, reviewed, and no changes needed.   Review of Systems: No fevers, chills, night  sweats, weight loss, chest pain, or shortness of breath.   Objective:   Blood pressure 118/80, pulse 73, SpO2 98 %.  General: Well Developed, well nourished, and in no acute distress.  Neuro: Alert and oriented x3, extra-ocular muscles intact, sensation grossly intact.  HEENT: Normocephalic, atraumatic, pupils equal round reactive to light, neck supple, no masses, no lymphadenopathy, thyroid nonpalpable.  Skin: Warm and dry, patient does have areas on the flexor surfaces think could be secondary to psoriasis versus eczema Cardiac:  no lower extremity edema. Respiratory: Not using accessory muscles, speaking in full sentences. Abdominal: NT, soft Gait: Nonantlagic, good balance and coordination Lymphatic: no lymphadenopathy in neck or axillae on palpation, non tender.  Musculoskeletal: Inspection and palpation of the right and left upper extremities including the shoulders elbows and wrist are unremarkable with full range of motion and good muscle strength and tone.  Hip: Left ROM IR: 25 Deg with pain and positive grind test still present. ER: 45 Deg, Flexion: 120 Deg, Extension: 100 Deg, Abduction: 45 Deg, Adduction: 45 Deg Strength IR: 5/5, ER: 5/5, Flexion: 5/5, Extension: 5/5, Abduction: 5/5, Adduction: 5/5 Pelvic alignment unremarkable to inspection and palpation. Standing hip rotation and gait without trendelenburg sign / unsteadiness. Mild discomfort over the piriformis but no tenderness over the greater trochanteric area Positive pain with internal rotation. Mild tightness  with Pearlean BrownieFaber No SI joint tenderness and normal minimal SI movement. Contralateral hip unremarkable  Osteopathic findings T3 extended rotated and side bent left L2 flexed rotated and side bent right Sacrum left on left   Impression and Recommendations:

## 2016-03-25 ENCOUNTER — Ambulatory Visit (INDEPENDENT_AMBULATORY_CARE_PROVIDER_SITE_OTHER): Payer: Managed Care, Other (non HMO) | Admitting: Family Medicine

## 2016-03-25 ENCOUNTER — Encounter: Payer: Self-pay | Admitting: Family Medicine

## 2016-03-25 VITALS — BP 118/80 | HR 73

## 2016-03-25 DIAGNOSIS — M25552 Pain in left hip: Secondary | ICD-10-CM

## 2016-03-25 DIAGNOSIS — M9902 Segmental and somatic dysfunction of thoracic region: Secondary | ICD-10-CM | POA: Diagnosis not present

## 2016-03-25 DIAGNOSIS — M9903 Segmental and somatic dysfunction of lumbar region: Secondary | ICD-10-CM | POA: Diagnosis not present

## 2016-03-25 DIAGNOSIS — R21 Rash and other nonspecific skin eruption: Secondary | ICD-10-CM

## 2016-03-25 DIAGNOSIS — M999 Biomechanical lesion, unspecified: Secondary | ICD-10-CM

## 2016-03-25 DIAGNOSIS — M9901 Segmental and somatic dysfunction of cervical region: Secondary | ICD-10-CM | POA: Diagnosis not present

## 2016-03-25 MED ORDER — TRIAMCINOLONE ACETONIDE 0.5 % EX CREA: 1.0000 "application " | TOPICAL_CREAM | Freq: Two times a day (BID) | CUTANEOUS | 3 refills | Status: DC

## 2016-03-25 MED ORDER — PREDNISONE 50 MG PO TABS: 50.0000 mg | ORAL_TABLET | Freq: Every day | ORAL | 0 refills | Status: DC

## 2016-03-25 NOTE — Assessment & Plan Note (Signed)
Decision today to treat with OMT was based on Physical Exam  After verbal consent patient was treated with   ME techniques in cervical, thoracic and lumbar areas Patient tolerated the procedure well with improvement in symptoms  Patient given exercises, stretches and lifestyle modifications including work positioning  See medications in patient instructions if given  Patient will follow up in 2-3 weeks

## 2016-03-25 NOTE — Assessment & Plan Note (Signed)
Eczema versus potential psoriatic arthritis. We'll continue to monitor. Triamcinolone cream prescribed.

## 2016-03-25 NOTE — Patient Instructions (Signed)
Good to see you  Cream if you need ti 2 times a day but try the prednisone daily for next 5 days STOP ALL SUPPLEMENTS Lets see how you do for the next 2 weeks.  Only low impact exercises for now.  See me again in 3 weeks.

## 2016-03-25 NOTE — Assessment & Plan Note (Signed)
Discussed with patient again at great length. I'm concern for patient having potential labral pathology. Patient has declined any imaging previously. Patient to continue with the home exercises but will decrease or supplements. We will see if that'll be any beneficial. Patient come back and see me again in 3 weeks.

## 2016-04-16 ENCOUNTER — Ambulatory Visit (INDEPENDENT_AMBULATORY_CARE_PROVIDER_SITE_OTHER): Payer: Managed Care, Other (non HMO) | Admitting: Family Medicine

## 2016-04-16 ENCOUNTER — Encounter: Payer: Self-pay | Admitting: Family Medicine

## 2016-04-16 VITALS — BP 122/78 | HR 86

## 2016-04-16 DIAGNOSIS — M2550 Pain in unspecified joint: Secondary | ICD-10-CM

## 2016-04-16 DIAGNOSIS — M999 Biomechanical lesion, unspecified: Secondary | ICD-10-CM

## 2016-04-16 DIAGNOSIS — M25552 Pain in left hip: Secondary | ICD-10-CM | POA: Diagnosis not present

## 2016-04-16 DIAGNOSIS — M25521 Pain in right elbow: Secondary | ICD-10-CM

## 2016-04-16 MED ORDER — MELOXICAM 15 MG PO TABS: 15.0000 mg | ORAL_TABLET | Freq: Every day | ORAL | 0 refills | Status: DC

## 2016-04-16 MED ORDER — VITAMIN D (ERGOCALCIFEROL) 1.25 MG (50000 UNIT) PO CAPS: 50000.0000 [IU] | ORAL_CAPSULE | ORAL | 0 refills | Status: DC

## 2016-04-16 NOTE — Patient Instructions (Signed)
I am sorry you are still hurting Check with insurance and see price of elbow MRI  And hip MR-arthrogram.  Michele Turner is your friend Stay active but max of 4 workout a week.  Once weekly vitamin D Meloxicam daily for 10 days then as needed See me again in 4 weeks. (30 minute appointment please)

## 2016-04-16 NOTE — Assessment & Plan Note (Signed)
Decision today to treat with OMT was based on Physical Exam  After verbal consent patient was treated with   ME techniques in cervical, thoracic and lumbar areas Patient tolerated the procedure well with improvement in symptoms  Patient given exercises, stretches and lifestyle modifications including work positioning  See medications in patient instructions if given  Patient will follow up in 4 weeks

## 2016-04-16 NOTE — Assessment & Plan Note (Signed)
Continues to have multiple different muscle skeletal complaints. Patient does do some over conditioning that I think is truly giving her some difficulty. We discussed the importance of recovery. Past medical family history is consistent for psoriatic arthritis and potential labs would need to be done again if patient continues to have difficulty. We discussed icing regimen, home exercises. Discussed vitamin D supplementation in case this is more autoimmune. Follow-up again in 4 weeks.

## 2016-04-16 NOTE — Assessment & Plan Note (Signed)
Discussed with patient if worsening symptoms she will need think MRI with her failing all other conservative therapy. Patient has done physical therapy for this, has failed everything else.

## 2016-04-16 NOTE — Progress Notes (Signed)
Subjective:    CC:  Left hip pain Follow-up Right elbow pain follow-up  HPI: Patient is complaining of more of the left hip pain. Has been seen for this previously. Patient decreased activity for one full week. States that she has stopped all of the over-the-counter medications with no significant improvement. Continues to have pain in the hip as well as continued have pain of the right elbow. Still working out most days if not every day of the week. Has difficulty staying still. Would state that there has been minimal change whatsoever.  Past Medical History:  Diagnosis Date  . Anxiety   . DVT (deep venous thrombosis) (HCC) 12/26/08   on OCP - YASMIN  . GERD (gastroesophageal reflux disease)   . Hiatal hernia    Past Surgical History:  Procedure Laterality Date  . BREAST ENHANCEMENT SURGERY    . INTRAUTERINE DEVICE INSERTION  02/02/09, 08/19/13   Mirena   Social History  Substance Use Topics  . Smoking status: Never Smoker  . Smokeless tobacco: Never Used  . Alcohol use No   No Known Allergies Family History  Problem Relation Age of Onset  . Asthma    . Hypertension    . Hyperlipidemia    . Breast cancer    . Colon cancer Paternal Grandmother     and paternal great grandfather  . Hypertension Mother   . Hyperlipidemia Mother   . Heart disease Mother   . Asthma Mother   . Pulmonary embolism Mother   . Osteopenia Mother   . Cancer Father     prostate  . Heart Problems Maternal Grandmother     born with hole in heart, now has pacemaker  . Osteoporosis Maternal Grandmother   . Diabetes Maternal Grandfather      Past medical history, Surgical history, Family history not pertinant except as noted below, Social history, Allergies, and medications have been entered into the medical record, reviewed, and no changes needed.   Review of Systems: No fevers, chills, night sweats, weight loss, chest pain, or shortness of breath.   Objective:   Blood pressure 122/78, pulse  86, SpO2 98 %.  General: Well Developed, well nourished, and in no acute distress.  Neuro: Alert and oriented x3, extra-ocular muscles intact, sensation grossly intact.  HEENT: Normocephalic, atraumatic, pupils equal round reactive to light, neck supple, no masses, no lymphadenopathy, thyroid nonpalpable.  Skin: Warm and dry, patient does have areas on the flexor surfaces think could be secondary to psoriasis versus eczema Cardiac:  no lower extremity edema. Respiratory: Not using accessory muscles, speaking in full sentences. Abdominal: NT, soft Gait: Nonantlagic, good balance and coordination Lymphatic: no lymphadenopathy in neck or axillae on palpation, non tender.  Musculoskeletal: Inspection and palpation of the right and left upper extremities including the shoulders elbows and wrist are unremarkable with full range of motion and good muscle strength and tone.  Hip: Left ROM IR: 25 Deg with pain and positive grind test still present. ER: 45 Deg, Flexion: 120 Deg, Extension: 100 Deg, Abduction: 45 Deg, Adduction: 45 Deg Strength IR: 5/5, ER: 5/5, Flexion: 5/5, Extension: 5/5, Abduction: 5/5, Adduction: 5/5 Pelvic alignment unremarkable to inspection and palpation. Standing hip rotation and gait without trendelenburg sign / unsteadiness. Tightness of piriformis but minimal pain Positive pain with internal rotation. Mild tightness with Pearlean BrownieFaber still present no worsening Increasing tenderness over the left sacroiliac joint Contralateral hip unremarkable  Osteopathic findings C2 flexed rotated and side bent right T3 extended rotated and  side bent left T7 extended rotated and side bent right L2 flexed rotated and side bent right Sacrum left on left   Impression and Recommendations:

## 2016-04-16 NOTE — Assessment & Plan Note (Signed)
Still concern the patient's left hip pain can be possibly labral tear. Patient has declined MR-arthrogram for further eval with patient wanting to avoid surgery.  Discussed PRP.   Once weekly vitamin D given Meloxciam given with patient being off anti-coagulation now. RTC in 4 weeks.

## 2016-07-09 ENCOUNTER — Encounter: Payer: Self-pay | Admitting: Family Medicine

## 2016-07-09 DIAGNOSIS — G5601 Carpal tunnel syndrome, right upper limb: Secondary | ICD-10-CM

## 2016-07-09 DIAGNOSIS — M7711 Lateral epicondylitis, right elbow: Secondary | ICD-10-CM

## 2016-08-20 ENCOUNTER — Ambulatory Visit (INDEPENDENT_AMBULATORY_CARE_PROVIDER_SITE_OTHER): Payer: Managed Care, Other (non HMO) | Admitting: Psychology

## 2016-08-20 DIAGNOSIS — F429 Obsessive-compulsive disorder, unspecified: Secondary | ICD-10-CM | POA: Diagnosis not present

## 2016-09-09 ENCOUNTER — Encounter: Payer: Self-pay | Admitting: Obstetrics and Gynecology

## 2016-09-09 ENCOUNTER — Ambulatory Visit (INDEPENDENT_AMBULATORY_CARE_PROVIDER_SITE_OTHER): Payer: 59 | Admitting: Obstetrics and Gynecology

## 2016-09-09 VITALS — BP 110/70 | HR 72 | Resp 16 | Ht 63.5 in | Wt 128.0 lb

## 2016-09-09 DIAGNOSIS — Z30431 Encounter for routine checking of intrauterine contraceptive device: Secondary | ICD-10-CM

## 2016-09-09 DIAGNOSIS — Z01419 Encounter for gynecological examination (general) (routine) without abnormal findings: Secondary | ICD-10-CM

## 2016-09-09 DIAGNOSIS — Z124 Encounter for screening for malignant neoplasm of cervix: Secondary | ICD-10-CM

## 2016-09-09 NOTE — Progress Notes (Signed)
41 y.o. G0P0 MarriedCaucasianF here for annual exam.   H/O DVT on OCP's, negative coagulation panel. Mirena placed in 11/16. Just occasional bleeding with the IUD, no regular cycles.    Patient's last menstrual period was 08/19/2016.          Sexually active: Yes.    The current method of family planning is IUD.    Exercising: Yes.    weight/ training  Smoker:  no  Health Maintenance: Pap:  09-06-15 WNL 08-26-14 ASCUS NEG HR HPV History of abnormal Pap:  yes MMG:  Never Colonoscopy:  Never BMD:   Never TDaP:  02/2015 Gardasil: N/A   reports that she has never smoked. She has never used smokeless tobacco. She reports that she drinks about 3.6 - 5.4 oz of alcohol per week . 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 ADD (attention deficit disorder)   . Anxiety   . Depression   . DVT (deep venous thrombosis) (HCC) 12/26/08   on OCP - YASMIN  . GERD (gastroesophageal reflux disease)   . Hiatal hernia     Past Surgical History:  Procedure Laterality Date  . BREAST ENHANCEMENT SURGERY    . INTRAUTERINE DEVICE INSERTION  02/02/09, 08/19/13   Mirena    Current Outpatient Prescriptions  Medication Sig Dispense Refill  . amphetamine-dextroamphetamine (ADDERALL) 10 MG tablet Take 1 tablet by mouth daily as needed.    [name]. aspirin 81 MG chewable tablet Chew 81 mg by mouth 2 (two) times daily.    [name]. buPROPion (WELLBUTRIN) 75 MG tablet Take 75 mg by mouth 2 (two) times daily.  5  . levonorgestrel (MIRENA) 20 MCG/24HR IUD 1 each by Intrauterine route once.      [name]. LEXAPRO 10 MG tablet Take 1 tablet (10 mg total) by mouth daily. Office visit due now 90 tablet 0  . lisdexamfetamine (VYVANSE) 70 MG capsule Take 1 capsule (70 mg total) by mouth daily. 30 capsule 0  . omeprazole-sodium bicarbonate (ZEGERID) 40-1100 MG per capsule Take 1 capsule by mouth daily.      [name]. URELLE (URELLE/URISED) 81 MG TABS Take 1 tablet (81 mg total) by mouth  as directed. 120 each 5  . VALTREX 1 G tablet TAKE AS DIRECTED. 30 tablet 11  . Vitamin D, Ergocalciferol, (DRISDOL) 50000 units CAPS capsule Take 1 capsule (50,000 Units total) by mouth every 7 (seven) days. 12 capsule 0   No current facility-administered medications for this visit.     Family History  Problem Relation Age of Onset  . Hypertension Mother   . Hyperlipidemia Mother   . Heart disease Mother   . Asthma Mother   . Pulmonary embolism Mother   . Osteopenia Mother   . Cancer Father     prostate  . Asthma    . Hypertension    . Hyperlipidemia    . Breast cancer    . Colon cancer Paternal Grandmother     and paternal great grandfather  . Heart Problems Maternal Grandmother     born with hole in heart, now has pacemaker  . Osteoporosis Maternal Grandmother   . Diabetes Maternal Grandfather     Review of Systems  Constitutional: Negative.   HENT: Negative.   Eyes: Negative.   Respiratory: Negative.   Cardiovascular: Negative.   Gastrointestinal: Negative.   Endocrine: Negative.   Genitourinary: Negative.   Musculoskeletal: Negative.   Skin: Negative.   Allergic/Immunologic: Negative.  Neurological: Negative.   Psychiatric/Behavioral: Negative.     Exam:   BP 110/70 (BP Location: Right Arm, Patient Position: Sitting, Cuff Size: Normal)   Pulse 72   Resp 16   Ht 5' 3.5" (1.613 m)   Wt 128 lb (58.1 kg)   LMP 08/19/2016   BMI 22.32 kg/m   Weight change: @WEIGHTCHANGE @ Height:   Height: 5' 3.5" (161.3 cm)  Ht Readings from Last 3 Encounters:  09/09/16 5' 3.5" (1.613 m)  11/29/15 5\' 4"  (1.626 m)  11/06/15 5\' 4"  (1.626 m)    General appearance: alert, cooperative and appears stated age Head: Normocephalic, without obvious abnormality, atraumatic Neck: no adenopathy, supple, symmetrical, trachea midline and thyroid normal to inspection and palpation Lungs: clear to auscultation bilaterally Cardiovascular: regular rate and rhythm Breasts: normal  appearance, no masses or tenderness Heart: regular rate and rhythm Abdomen: soft, non-tender; bowel sounds normal; no masses,  no organomegaly Extremities: extremities normal, atraumatic, no cyanosis or edema Skin: Skin color, texture, turgor normal. No rashes or lesions Lymph nodes: Cervical, supraclavicular, and axillary nodes normal. No abnormal inguinal nodes palpated Neurologic: Grossly normal   Pelvic: External genitalia:  no lesions              Urethra:  normal appearing urethra with no masses, tenderness or lesions              Bartholins and Skenes: normal                 Vagina: normal appearing vagina with normal color and discharge, no lesions              Cervix: no lesions and IUD string 3 cm               Bimanual Exam:  Uterus:  normal size, contour, position, consistency, mobility, non-tender and anteverted              Adnexa: no mass, fullness, tenderness               Rectovaginal: Confirms               Anus:  normal sphincter tone, no lesions  Chaperone was present for exam.  A:  Well Woman with normal exam  IUD check    P:   Pap with reflex hpv  Labs with primary MD  Discussed breast self exam  Discussed calcium and vit D intake

## 2016-09-09 NOTE — Patient Instructions (Signed)

## 2016-09-09 NOTE — Addendum Note (Signed)
Addended by: Tobi BastosJERTSON, JILL E on: 09/09/2016 04:35 PM   Modules accepted: Orders

## 2016-09-10 LAB — IPS PAP TEST WITH REFLEX TO HPV

## 2016-12-06 DIAGNOSIS — G5601 Carpal tunnel syndrome, right upper limb: Secondary | ICD-10-CM

## 2016-12-06 HISTORY — DX: Carpal tunnel syndrome, right upper limb: G56.01

## 2016-12-20 ENCOUNTER — Other Ambulatory Visit: Payer: Self-pay | Admitting: Orthopedic Surgery

## 2016-12-31 ENCOUNTER — Encounter (HOSPITAL_BASED_OUTPATIENT_CLINIC_OR_DEPARTMENT_OTHER): Payer: Self-pay | Admitting: *Deleted

## 2017-01-03 ENCOUNTER — Encounter (HOSPITAL_BASED_OUTPATIENT_CLINIC_OR_DEPARTMENT_OTHER): Payer: Self-pay | Admitting: *Deleted

## 2017-01-07 ENCOUNTER — Ambulatory Visit (HOSPITAL_BASED_OUTPATIENT_CLINIC_OR_DEPARTMENT_OTHER)
Admission: RE | Admit: 2017-01-07 | Discharge: 2017-01-07 | Disposition: A | Discharge status: Home / Self Care | Payer: 59 | Source: Ambulatory Visit | Attending: Orthopedic Surgery | Admitting: Orthopedic Surgery

## 2017-01-07 ENCOUNTER — Ambulatory Visit (HOSPITAL_BASED_OUTPATIENT_CLINIC_OR_DEPARTMENT_OTHER): Payer: 59 | Admitting: Anesthesiology

## 2017-01-07 ENCOUNTER — Encounter (HOSPITAL_BASED_OUTPATIENT_CLINIC_OR_DEPARTMENT_OTHER): Payer: Self-pay | Admitting: *Deleted

## 2017-01-07 ENCOUNTER — Encounter (HOSPITAL_BASED_OUTPATIENT_CLINIC_OR_DEPARTMENT_OTHER)
Admission: RE | Disposition: A | Discharge status: Home / Self Care | Payer: Self-pay | Source: Ambulatory Visit | Attending: Orthopedic Surgery

## 2017-01-07 DIAGNOSIS — I739 Peripheral vascular disease, unspecified: Secondary | ICD-10-CM | POA: Insufficient documentation

## 2017-01-07 DIAGNOSIS — G5601 Carpal tunnel syndrome, right upper limb: Secondary | ICD-10-CM | POA: Diagnosis present

## 2017-01-07 DIAGNOSIS — F419 Anxiety disorder, unspecified: Secondary | ICD-10-CM | POA: Insufficient documentation

## 2017-01-07 DIAGNOSIS — J45990 Exercise induced bronchospasm: Secondary | ICD-10-CM | POA: Diagnosis not present

## 2017-01-07 DIAGNOSIS — Z79899 Other long term (current) drug therapy: Secondary | ICD-10-CM | POA: Diagnosis not present

## 2017-01-07 DIAGNOSIS — M47812 Spondylosis without myelopathy or radiculopathy, cervical region: Secondary | ICD-10-CM | POA: Diagnosis not present

## 2017-01-07 DIAGNOSIS — Z7982 Long term (current) use of aspirin: Secondary | ICD-10-CM | POA: Insufficient documentation

## 2017-01-07 DIAGNOSIS — F329 Major depressive disorder, single episode, unspecified: Secondary | ICD-10-CM | POA: Diagnosis not present

## 2017-01-07 DIAGNOSIS — K219 Gastro-esophageal reflux disease without esophagitis: Secondary | ICD-10-CM | POA: Diagnosis not present

## 2017-01-07 DIAGNOSIS — F988 Other specified behavioral and emotional disorders with onset usually occurring in childhood and adolescence: Secondary | ICD-10-CM | POA: Diagnosis not present

## 2017-01-07 DIAGNOSIS — Z86718 Personal history of other venous thrombosis and embolism: Secondary | ICD-10-CM | POA: Insufficient documentation

## 2017-01-07 HISTORY — DX: Exercise induced bronchospasm: J45.990

## 2017-01-07 HISTORY — DX: Other cervical disc degeneration, unspecified cervical region: M50.30

## 2017-01-07 HISTORY — DX: Carpal tunnel syndrome, right upper limb: G56.01

## 2017-01-07 HISTORY — DX: Adverse effect of unspecified anesthetic, initial encounter: T41.45XA

## 2017-01-07 HISTORY — PX: CARPAL TUNNEL RELEASE: SHX101

## 2017-01-07 HISTORY — DX: Other complications of anesthesia, initial encounter: T88.59XA

## 2017-01-07 HISTORY — DX: Other cervical disc displacement, unspecified cervical region: M50.20

## 2017-01-07 HISTORY — DX: Personal history of other venous thrombosis and embolism: Z86.718

## 2017-01-07 HISTORY — DX: Cardiac murmur, unspecified: R01.1

## 2017-01-07 HISTORY — DX: Spondylosis without myelopathy or radiculopathy, cervical region: M47.812

## 2017-01-07 HISTORY — DX: Stiffness of unspecified joint, not elsewhere classified: M25.60

## 2017-01-07 SURGERY — CARPAL TUNNEL RELEASE
Anesthesia: Monitor Anesthesia Care | Site: Wrist | Laterality: Right

## 2017-01-07 MED ORDER — HYDROCODONE-ACETAMINOPHEN 5-325 MG PO TABS: 1.0000 | ORAL_TABLET | Freq: Four times a day (QID) | ORAL | 0 refills | Status: DC | PRN

## 2017-01-07 MED ORDER — 0.9 % SODIUM CHLORIDE (POUR BTL) OPTIME
TOPICAL | Status: DC | PRN
  Administered 2017-01-07: 150 mL

## 2017-01-07 MED ORDER — MIDAZOLAM HCL 2 MG/2ML IJ SOLN
1.0000 mg | INTRAMUSCULAR | Status: DC | PRN
  Administered 2017-01-07 (×2): 1 mg via INTRAVENOUS

## 2017-01-07 MED ORDER — CEFAZOLIN SODIUM-DEXTROSE 2-4 GM/100ML-% IV SOLN
2.0000 g | INTRAVENOUS | Status: AC
  Administered 2017-01-07: 2 g via INTRAVENOUS

## 2017-01-07 MED ORDER — CEFAZOLIN SODIUM-DEXTROSE 2-4 GM/100ML-% IV SOLN
INTRAVENOUS | Status: AC
Start: 2017-01-07 — End: ?
  Filled 2017-01-07: qty 100

## 2017-01-07 MED ORDER — FENTANYL CITRATE (PF) 100 MCG/2ML IJ SOLN
INTRAMUSCULAR | Status: AC
  Filled 2017-01-07: qty 2

## 2017-01-07 MED ORDER — PROPOFOL 10 MG/ML IV BOLUS
INTRAVENOUS | Status: DC | PRN
  Administered 2017-01-07: 20 mg via INTRAVENOUS

## 2017-01-07 MED ORDER — MIDAZOLAM HCL 2 MG/2ML IJ SOLN
INTRAMUSCULAR | Status: AC
  Filled 2017-01-07: qty 2

## 2017-01-07 MED ORDER — SCOPOLAMINE 1 MG/3DAYS TD PT72: 1.0000 | MEDICATED_PATCH | Freq: Once | TRANSDERMAL | Status: DC | PRN

## 2017-01-07 MED ORDER — CHLORHEXIDINE GLUCONATE 4 % EX LIQD: 60.0000 mL | Freq: Once | CUTANEOUS | Status: DC

## 2017-01-07 MED ORDER — BUPIVACAINE HCL (PF) 0.25 % IJ SOLN
INTRAMUSCULAR | Status: DC | PRN
  Administered 2017-01-07: 7 mL

## 2017-01-07 MED ORDER — LACTATED RINGERS IV SOLN
INTRAVENOUS | Status: DC
  Administered 2017-01-07: 09:00:00 via INTRAVENOUS

## 2017-01-07 MED ORDER — PROPOFOL 500 MG/50ML IV EMUL
INTRAVENOUS | Status: DC | PRN
  Administered 2017-01-07: 75 ug/kg/min via INTRAVENOUS

## 2017-01-07 MED ORDER — FENTANYL CITRATE (PF) 100 MCG/2ML IJ SOLN
50.0000 ug | INTRAMUSCULAR | Status: DC | PRN
  Administered 2017-01-07 (×2): 50 ug via INTRAVENOUS

## 2017-01-07 MED ORDER — ONDANSETRON HCL 4 MG/2ML IJ SOLN
INTRAMUSCULAR | Status: DC | PRN
  Administered 2017-01-07: 4 mg via INTRAVENOUS

## 2017-01-07 MED ORDER — ONDANSETRON HCL 4 MG/2ML IJ SOLN
INTRAMUSCULAR | Status: AC
  Filled 2017-01-07: qty 2

## 2017-01-07 SURGICAL SUPPLY — 36 items
BLADE SURG 15 STRL LF DISP TIS (BLADE) ×1 IMPLANT
BLADE SURG 15 STRL SS (BLADE) ×2
BNDG CMPR 9X4 STRL LF SNTH (GAUZE/BANDAGES/DRESSINGS)
BNDG COHESIVE 3X5 TAN STRL LF (GAUZE/BANDAGES/DRESSINGS) ×2 IMPLANT
BNDG ESMARK 4X9 LF (GAUZE/BANDAGES/DRESSINGS) IMPLANT
BNDG GAUZE ELAST 4 BULKY (GAUZE/BANDAGES/DRESSINGS) ×2 IMPLANT
CHLORAPREP W/TINT 26ML (MISCELLANEOUS) ×2 IMPLANT
CORD BIPOLAR FORCEPS 12FT (ELECTRODE) ×2 IMPLANT
COVER BACK TABLE 60X90IN (DRAPES) ×2 IMPLANT
COVER MAYO STAND STRL (DRAPES) ×2 IMPLANT
CUFF TOURNIQUET SINGLE 18IN (TOURNIQUET CUFF) ×2 IMPLANT
DRAPE EXTREMITY T 121X128X90 (DRAPE) ×2 IMPLANT
DRAPE SURG 17X23 STRL (DRAPES) ×2 IMPLANT
DRSG PAD ABDOMINAL 8X10 ST (GAUZE/BANDAGES/DRESSINGS) ×2 IMPLANT
GAUZE SPONGE 4X4 12PLY STRL (GAUZE/BANDAGES/DRESSINGS) ×2 IMPLANT
GAUZE XEROFORM 1X8 LF (GAUZE/BANDAGES/DRESSINGS) ×2 IMPLANT
GLOVE BIO SURGEON STRL SZ 6.5 (GLOVE) ×2 IMPLANT
GLOVE BIOGEL PI IND STRL 6.5 (GLOVE) IMPLANT
GLOVE BIOGEL PI IND STRL 8.5 (GLOVE) ×1 IMPLANT
GLOVE BIOGEL PI INDICATOR 6.5 (GLOVE) ×2
GLOVE BIOGEL PI INDICATOR 8.5 (GLOVE) ×1
GLOVE SURG ORTHO 8.0 STRL STRW (GLOVE) ×2 IMPLANT
GOWN STRL REUS W/ TWL LRG LVL3 (GOWN DISPOSABLE) ×1 IMPLANT
GOWN STRL REUS W/TWL LRG LVL3 (GOWN DISPOSABLE) ×4
GOWN STRL REUS W/TWL XL LVL3 (GOWN DISPOSABLE) ×2 IMPLANT
NDL PRECISIONGLIDE 27X1.5 (NEEDLE) IMPLANT
NEEDLE PRECISIONGLIDE 27X1.5 (NEEDLE) ×2 IMPLANT
NS IRRIG 1000ML POUR BTL (IV SOLUTION) ×2 IMPLANT
PACK BASIN DAY SURGERY FS (CUSTOM PROCEDURE TRAY) ×2 IMPLANT
STOCKINETTE 4X48 STRL (DRAPES) ×2 IMPLANT
SUT ETHILON 4 0 PS 2 18 (SUTURE) ×2 IMPLANT
SUT VICRYL 4-0 PS2 18IN ABS (SUTURE) IMPLANT
SYR BULB 3OZ (MISCELLANEOUS) ×2 IMPLANT
SYR CONTROL 10ML LL (SYRINGE) ×1 IMPLANT
TOWEL OR 17X24 6PK STRL BLUE (TOWEL DISPOSABLE) ×2 IMPLANT
UNDERPAD 30X30 (UNDERPADS AND DIAPERS) ×2 IMPLANT

## 2017-01-07 NOTE — Op Note (Signed)
Dictation Number [id number]

## 2017-01-07 NOTE — Transfer of Care (Signed)
Immediate Anesthesia Transfer of Care Note  Patient: Michele Turner  Procedure(s) Performed: Procedure(s) with comments: CARPAL TUNNEL RELEASE (Right) - REG/FAB  Patient Location: PACU  Anesthesia Type:Bier block  Level of Consciousness: awake, alert  and oriented  Airway & Oxygen Therapy: Patient Spontanous Breathing and Patient connected to face mask oxygen  Post-op Assessment: Report given to RN and Post -op Vital signs reviewed and stable  Post vital signs: Reviewed and stable  Last Vitals:  Vitals:   01/07/17 0831  BP: 109/69  Pulse: 73  Resp: 20  Temp: 36.6 C    Last Pain:  Vitals:   01/07/17 0831  TempSrc: Oral  PainSc:          Complications: No apparent anesthesia complications

## 2017-01-07 NOTE — Anesthesia Preprocedure Evaluation (Addendum)
Anesthesia Evaluation  Patient identified by MRN, date of birth, ID band Patient awake    Reviewed: Allergy & Precautions, NPO status , Patient's Chart, lab work & pertinent test results  Airway Mallampati: I  TM Distance: >3 FB Neck ROM: Full    Dental no notable dental hx.    Pulmonary neg pulmonary ROS, asthma ,    Pulmonary exam normal breath sounds clear to auscultation       Cardiovascular + Peripheral Vascular Disease  negative cardio ROS Normal cardiovascular exam Rhythm:Regular Rate:Normal     Neuro/Psych  Neuromuscular disease negative neurological ROS  negative psych ROS   GI/Hepatic negative GI ROS, Neg liver ROS, GERD  ,  Endo/Other  negative endocrine ROS  Renal/GU negative Renal ROS  negative genitourinary   Musculoskeletal negative musculoskeletal ROS (+)   Abdominal   Peds negative pediatric ROS (+)  Hematology negative hematology ROS (+)   Anesthesia Other Findings   Reproductive/Obstetrics negative OB ROS                            Anesthesia Physical Anesthesia Plan  ASA: II  Anesthesia Plan: MAC and Bier Block   Post-op Pain Management:    Induction: Intravenous  PONV Risk Score and Plan: 2 and Ondansetron, Dexamethasone and Metaclopromide  Airway Management Planned: Natural Airway  Additional Equipment:   Intra-op Plan:   Post-operative Plan:   Informed Consent: I have reviewed the patients History and Physical, chart, labs and discussed the procedure including the risks, benefits and alternatives for the proposed anesthesia with the patient or authorized representative who has indicated his/her understanding and acceptance.   Dental advisory given  Plan Discussed with: CRNA and Anesthesiologist  Anesthesia Plan Comments:        Anesthesia Quick Evaluation

## 2017-01-07 NOTE — Discharge Instructions (Addendum)

## 2017-01-07 NOTE — Brief Op Note (Signed)
01/07/2017  10:07 AM  PATIENT:  Michele Turner  41 y.o. female  PRE-OPERATIVE DIAGNOSIS:  RIGHT CARPAL TUNNEL RELEASE  POST-OPERATIVE DIAGNOSIS:  RIGHT CARPAL TUNNEL RELEASE  PROCEDURE:  Procedure(s) with comments: CARPAL TUNNEL RELEASE (Right) - REG/FAB  SURGEON:  Surgeon(s) and Role:    Cindee Salt* Iridessa Harrow, MD - Primary  PHYSICIAN ASSISTANT:   ASSISTANTS: none   ANESTHESIA:   local and regional  EBL:  Total I/O In: 400 [I.V.:400] Out: -   BLOOD ADMINISTERED:none  DRAINS: none   LOCAL MEDICATIONS USED:  BUPIVICAINE   SPECIMEN:  No Specimen  DISPOSITION OF SPECIMEN:  N/A  COUNTS:  YES  TOURNIQUET:   Total Tourniquet Time Documented: Forearm (Right) - 22 minutes Total: Forearm (Right) - 22 minutes   DICTATION: .Other Dictation: Dictation Number [id number]  PLAN OF CARE: Discharge to home after PACU  PATIENT DISPOSITION:  PACU - hemodynamically stable.
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
## 2017-01-07 NOTE — Op Note (Signed)
NAMMarland Turner:  Bronson CurbTERSON, Alexxus          ACCOUNT NO.:  [id number]  MEDICAL RECORD NO.:  [id number]  LOCATION:                                 FACILITY:  PHYSICIAN:  Cindee SaltGary Sokhna Christoph, M.D.            DATE OF BIRTH:  DATE OF PROCEDURE:  01/07/2017 DATE OF DISCHARGE:                              OPERATIVE REPORT   PREOPERATIVE DIAGNOSIS:  Carpal tunnel syndrome, right hand.  POSTOPERATIVE DIAGNOSIS:  Carpal tunnel syndrome, right hand.  OPERATION:  Decompression right median nerve.  SURGEON:  Cindee SaltGary Vishal Sandlin, MD.  ANESTHESIA:  Forearm-based IV regional with local infiltration.  [facility]  HISTORY:  The patient is a 41 year old female with a history of numbness and tingling of her right arm.  She has had positive nerve conductions. She has cervical spine problems.  She has seen Dr. __________for this. She has elected to undergo surgical decompression to the median nerve, being aware that this may be a double crush.  Pre, peri, and postoperative course have been discussed along with risks and complications.  She is aware that there is no guarantee to the surgery, the possibility of infection; recurrence of injury to arteries, nerves, tendons; incomplete relief of symptoms and dystrophy.  In the preoperative area, the patient was seen, the extremity marked by both patient and surgeon.  Antibiotic given.  PROCEDURE IN DETAIL:  The patient was brought to the operating room, where a forearm-based IV regional anesthetic was carried out without difficulty under the direction of the Anesthesia Department.  She was prepped using ChloraPrep and in supine position with the right arm free. A 3-minute dry time was allowed and time-out taken, confirming the patient and procedure.  After adequate anesthesia was afforded, a longitudinal was incision was made in the right palm.  This was carried down through subcutaneous tissue.  Bleeders were electrocauterized with bipolar.   The palmar fascia was split.  The superficial palmar arch was identified.  The flexor tendon to the ring and little finger was identified.  Retractors were placed retracting the median nerve and flexor tendons radially.  The ulnar nerve was protected ulnarly.  The flexor retinaculum was then incised on its ulnar border.  A right angle and Sewell retractor were placed between skin and forearm fascia.  The underlying tendons and tenosynovium tissue nerve were then dissected free with blunt dissection.  Blunt scissors were then used to transect the proximal aspect of the flexor retinaculum and distal forearm fascia approximately 2 cm proximal to the wrist crease under direct vision. The canal was explored.  An area of compression to the nerve was apparent.  Motor branch entered into muscle distally.  The wound was copiously irrigated with saline.  The skin was then closed with interrupted 4-0 nylon sutures.  A local infiltration with 0.25% bupivacaine without epinephrine was given.  Approximately 8 mL was used.  A sterile compressive dressing with the fingers free was applied.  On deflation of the tourniquet, all fingers immediately pinked.  She was taken to the recovery room for observation in satisfactory condition.  She will be discharged to home [facility]  [place] in 1 week, on [place].          ______________________________ Cindee Salt, M.D.     GK/MEDQ  D:  01/07/2017  T:  01/07/2017  Job:  [id number]

## 2017-01-07 NOTE — Anesthesia Postprocedure Evaluation (Signed)
Anesthesia Post Note  Patient: Michele Turner  Procedure(s) Performed: Procedure(s) (LRB): CARPAL TUNNEL RELEASE (Right)     Patient location during evaluation: PACU Anesthesia Type: MAC Level of consciousness: awake and alert Pain management: pain level controlled Vital Signs Assessment: post-procedure vital signs reviewed and stable Respiratory status: spontaneous breathing, nonlabored ventilation, respiratory function stable and patient connected to nasal cannula oxygen Cardiovascular status: stable and blood pressure returned to baseline Anesthetic complications: no    Last Vitals:  Vitals:   01/07/17 1015 01/07/17 1030  BP: 102/71 91/67  Pulse: 66 67  Resp: 14 20  Temp:      Last Pain:  Vitals:   01/07/17 1030  TempSrc:   PainSc: 0-No pain                 Jerrin Recore

## 2017-01-09 ENCOUNTER — Encounter (HOSPITAL_BASED_OUTPATIENT_CLINIC_OR_DEPARTMENT_OTHER): Payer: Self-pay | Admitting: Orthopedic Surgery
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
## 2017-05-05 ENCOUNTER — Ambulatory Visit: Payer: Self-pay

## 2017-05-05 ENCOUNTER — Encounter: Payer: Self-pay | Admitting: Family Medicine

## 2017-05-05 ENCOUNTER — Ambulatory Visit (INDEPENDENT_AMBULATORY_CARE_PROVIDER_SITE_OTHER): Payer: 59 | Admitting: Family Medicine

## 2017-05-05 VITALS — BP 120/70 | HR 86 | Ht 64.0 in | Wt 125.0 lb

## 2017-05-05 DIAGNOSIS — M79604 Pain in right leg: Secondary | ICD-10-CM

## 2017-05-05 DIAGNOSIS — M76899 Other specified enthesopathies of unspecified lower limb, excluding foot: Secondary | ICD-10-CM

## 2017-05-05 MED ORDER — NITROGLYCERIN 0.2 MG/HR TD PT24: MEDICATED_PATCH | TRANSDERMAL | 1 refills | Status: DC

## 2017-05-05 NOTE — Patient Instructions (Signed)
Good to dee you  The hamstring looks good.  Ice is your friend Continue the compression  Whole body vibration (huretl)  Walk on flat surfaces Send me a message in 2 weeks and if not better we can consider PRP Otherwise see me again in 3 weeks to make sure you are doing well.

## 2017-05-05 NOTE — Assessment & Plan Note (Signed)
Patient on this problem 3 years ago. Seems to be having a recurrent issue. Patient didn't have scar tissue noted and I think the patient actually didn't do more of the scar tissue itself. We discussed the differential includes a lumbar radiculopathy, home exercises were given. Patient given a compression sleeve. We discussed avoiding any high impact exercises. We discussed the importance of recovery. Follow-up again in 4-6 weeks

## 2017-05-26 ENCOUNTER — Encounter: Payer: Self-pay | Admitting: Family Medicine

## 2017-05-26 ENCOUNTER — Ambulatory Visit: Payer: 59 | Admitting: Family Medicine

## 2017-05-26 ENCOUNTER — Ambulatory Visit: Payer: Self-pay

## 2017-05-26 VITALS — BP 128/80 | HR 84 | Ht 64.0 in

## 2017-05-26 DIAGNOSIS — M79604 Pain in right leg: Secondary | ICD-10-CM

## 2017-05-26 DIAGNOSIS — M76899 Other specified enthesopathies of unspecified lower limb, excluding foot: Secondary | ICD-10-CM | POA: Diagnosis not present

## 2017-05-26 NOTE — Assessment & Plan Note (Signed)
Patient has had partial tearing previously.  Considering tissue formation with increasing Doppler flow.  Patient has been compliant with the conservative therapy and is finally making progress.  I am encouraged by this.  Patient will start to advance activity accordingly.  Patient given very specific instructions.  Discussed with patient at great length, we discussed icing regimen.  Continue compression.  Following up with me again in 4 weeks.  Spent  25 minutes with patient face-to-face and had greater than 50% of counseling including as described above in assessment and plan.
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
## 2017-05-26 NOTE — Patient Instructions (Signed)
Good to see you  Michele Turner is your friend.  Get back to working out.  In 2 weeks then ok to go to [employer] 1 time a week.  Then 2 times a week for 2 weeks Then up to 3 times a week  You are doing amazing See me again in 4-6 weeks

## 2017-06-17 ENCOUNTER — Telehealth: Payer: Self-pay | Admitting: Obstetrics and Gynecology

## 2017-06-17 NOTE — Telephone Encounter (Signed)
Left message to call Ugo Thoma at [phone].  

## 2017-06-17 NOTE — Telephone Encounter (Signed)
Patient sent in the following MyChart message:  ---- Message from Mychart, Generic sent at 06/17/2017 7:29 AM EST -----    Appointment Request From: Michele Turner    With Provider: Romualdo BolkJill Evelyn Jertson, MD Ginette Otto[[facility]]    Preferred Date Range: 06/19/2017 - 06/25/2017    Preferred Times: Any time    Reason for visit: Request an Appointment    Comments:  need to come in to have left breast examined. pain in left nipple and under arm persisting for two weeks   Routing to triage for further assessment and scheduling.  Last seen: 09/09/16

## 2017-06-18 NOTE — Telephone Encounter (Signed)
Spoke with patient in regards to MyChart message as seen below. Denies nipple d/c or skin changes. OV scheduled for 12/13 at 1:45pm with Dr. Hyacinth MeekerMiller. Patient is agreeable to date and time.   Routing to provider for final review. Patient is agreeable to disposition. Will close encounter.

## 2017-06-19 ENCOUNTER — Other Ambulatory Visit: Payer: Self-pay

## 2017-06-19 ENCOUNTER — Ambulatory Visit: Payer: 59 | Admitting: Obstetrics & Gynecology

## 2017-06-19 ENCOUNTER — Encounter: Payer: Self-pay | Admitting: Obstetrics & Gynecology

## 2017-06-19 VITALS — BP 118/70 | HR 84 | Resp 16 | Ht 64.0 in | Wt 125.0 lb

## 2017-06-19 DIAGNOSIS — N644 Mastodynia: Secondary | ICD-10-CM | POA: Diagnosis not present
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
## 2017-06-23 ENCOUNTER — Ambulatory Visit: Payer: Self-pay | Admitting: Family Medicine

## 2017-09-10 ENCOUNTER — Other Ambulatory Visit: Payer: Self-pay

## 2017-09-10 ENCOUNTER — Encounter: Payer: Self-pay | Admitting: Obstetrics and Gynecology

## 2017-09-10 ENCOUNTER — Ambulatory Visit: Payer: 59 | Admitting: Obstetrics and Gynecology

## 2017-09-10 VITALS — BP 122/78 | HR 76 | Resp 12

## 2017-09-10 DIAGNOSIS — Z01419 Encounter for gynecological examination (general) (routine) without abnormal findings: Secondary | ICD-10-CM

## 2017-09-10 NOTE — Patient Instructions (Signed)
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
## 2018-08-04 ENCOUNTER — Ambulatory Visit: Payer: 59 | Admitting: Gastroenterology

## 2018-08-04 ENCOUNTER — Encounter: Payer: Self-pay | Admitting: Gastroenterology

## 2018-08-04 VITALS — BP 96/62 | HR 82 | Ht 64.0 in | Wt 130.0 lb

## 2018-08-04 DIAGNOSIS — R1084 Generalized abdominal pain: Secondary | ICD-10-CM | POA: Diagnosis not present

## 2018-08-04 MED ORDER — DICYCLOMINE HCL 20 MG PO TABS: 20.0000 mg | ORAL_TABLET | Freq: Three times a day (TID) | ORAL | 1 refills | Status: AC

## 2018-08-04 NOTE — Patient Instructions (Signed)
Please keep a food diary as well as records of when your symptoms occur.   I encourage you to investigate the FODMAP diet using the following website: [email or web address]  Try Bentyl up to four times daily for control of your abdominal cramping.  Add Align - once daily.  Return to this clinic with your food and symptom diarrhea.
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
## 2018-08-07 ENCOUNTER — Ambulatory Visit: Payer: 59 | Admitting: Family Medicine

## 2018-08-07 ENCOUNTER — Encounter: Payer: Self-pay | Admitting: Family Medicine

## 2018-08-07 VITALS — BP 114/78 | HR 86 | Temp 97.8°F | Ht 64.0 in | Wt 130.6 lb

## 2018-08-07 DIAGNOSIS — E559 Vitamin D deficiency, unspecified: Secondary | ICD-10-CM | POA: Diagnosis not present

## 2018-08-07 DIAGNOSIS — F418 Other specified anxiety disorders: Secondary | ICD-10-CM

## 2018-08-07 DIAGNOSIS — Z7689 Persons encountering health services in other specified circumstances: Secondary | ICD-10-CM

## 2018-08-07 DIAGNOSIS — F988 Other specified behavioral and emotional disorders with onset usually occurring in childhood and adolescence: Secondary | ICD-10-CM | POA: Diagnosis not present

## 2018-08-07 LAB — VITAMIN D 25 HYDROXY (VIT D DEFICIENCY, FRACTURES): VITD: 43.3 ng/mL (ref 30.00–100.00)
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
## 2018-08-13 ENCOUNTER — Encounter: Payer: Self-pay | Admitting: Family Medicine

## 2018-08-13 ENCOUNTER — Ambulatory Visit (INDEPENDENT_AMBULATORY_CARE_PROVIDER_SITE_OTHER): Payer: 59 | Admitting: Family Medicine

## 2018-08-13 VITALS — BP 120/74 | HR 83 | Temp 98.0°F | Ht 64.0 in | Wt 129.6 lb

## 2018-08-13 DIAGNOSIS — Z0000 Encounter for general adult medical examination without abnormal findings: Secondary | ICD-10-CM

## 2018-08-13 DIAGNOSIS — Z8349 Family history of other endocrine, nutritional and metabolic diseases: Secondary | ICD-10-CM

## 2018-08-13 DIAGNOSIS — F988 Other specified behavioral and emotional disorders with onset usually occurring in childhood and adolescence: Secondary | ICD-10-CM

## 2018-08-13 DIAGNOSIS — Z1239 Encounter for other screening for malignant neoplasm of breast: Secondary | ICD-10-CM | POA: Diagnosis not present

## 2018-08-13 DIAGNOSIS — Z5320 Procedure and treatment not carried out because of patient's decision for unspecified reasons: Secondary | ICD-10-CM

## 2018-08-13 LAB — BASIC METABOLIC PANEL
BUN: 14 mg/dL (ref 6–23)
CHLORIDE: 103 meq/L (ref 96–112)
CO2: 28 mEq/L (ref 19–32)
CREATININE: 0.76 mg/dL (ref 0.40–1.20)
Calcium: 9 mg/dL (ref 8.4–10.5)
GFR: 84.04 mL/min (ref >60.00)
GLUCOSE: 98 mg/dL (ref 70–99)
POTASSIUM: 3.6 meq/L (ref 3.5–5.1)
Sodium: 137 mEq/L (ref 135–145)

## 2018-08-13 LAB — LIPID PANEL
CHOL/HDL RATIO: 2
Cholesterol: 246 mg/dL — ABNORMAL HIGH (ref 0–200)
HDL: 117.7 mg/dL (ref >39.00)
LDL CALC: 120 mg/dL — AB (ref 0–99)
NONHDL: 128.34
Triglycerides: 42 mg/dL (ref 0.0–149.0)
VLDL: 8.4 mg/dL (ref 0.0–40.0)

## 2018-08-13 LAB — T4, FREE: Free T4: 0.8 ng/dL (ref 0.60–1.60)

## 2018-08-13 LAB — ALT: ALT: 15 U/L (ref 0–35)

## 2018-08-13 LAB — AST: AST: 20 U/L (ref 0–37)

## 2018-08-13 LAB — TSH: TSH: 1.21 u[IU]/mL (ref 0.35–4.50)

## 2018-08-13 MED ORDER — LISDEXAMFETAMINE DIMESYLATE 70 MG PO CAPS: 70.0000 mg | ORAL_CAPSULE | Freq: Every day | ORAL | 0 refills | Status: DC

## 2018-08-13 NOTE — Patient Instructions (Signed)
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
## 2018-08-14 ENCOUNTER — Encounter: Payer: Self-pay | Admitting: Family Medicine

## 2018-08-14 LAB — T3: T3, Total: 96 ng/dL (ref 76–181)

## 2018-09-10 ENCOUNTER — Ambulatory Visit: Payer: Self-pay

## 2018-10-01 ENCOUNTER — Ambulatory Visit: Payer: 59 | Admitting: Obstetrics and Gynecology

## 2018-10-06 ENCOUNTER — Ambulatory Visit: Payer: Self-pay

## 2018-11-16 ENCOUNTER — Other Ambulatory Visit: Payer: Self-pay | Admitting: Family Medicine

## 2018-11-16 DIAGNOSIS — F988 Other specified behavioral and emotional disorders with onset usually occurring in childhood and adolescence: Secondary | ICD-10-CM

## 2018-11-16 NOTE — Telephone Encounter (Signed)
Set pt up for 3 mo f/u to get med refill

## 2018-11-17 ENCOUNTER — Other Ambulatory Visit: Payer: Self-pay

## 2018-11-17 ENCOUNTER — Encounter: Payer: Self-pay | Admitting: Family Medicine

## 2018-11-17 ENCOUNTER — Ambulatory Visit: Payer: 59 | Admitting: Family Medicine

## 2018-11-17 DIAGNOSIS — S83241A Other tear of medial meniscus, current injury, right knee, initial encounter: Secondary | ICD-10-CM | POA: Diagnosis not present

## 2018-11-17 DIAGNOSIS — S83249A Other tear of medial meniscus, current injury, unspecified knee, initial encounter: Secondary | ICD-10-CM | POA: Insufficient documentation

## 2018-11-17 NOTE — Patient Instructions (Signed)
Good to see you  You should do great  Very small meniscal tear but will do well  No jumping or twisting for 3 weeks A knee sleeve should  Be good.  See me again in 4 weeks if not perfect
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
## 2018-11-17 NOTE — Assessment & Plan Note (Signed)
Meniscus tear noted.  Discussed icing regimen and home exercise.  Discussed which activities of doing which wants to avoid.  Discussed with athletic trainer.  Follow-up again in 4 to 6 weeks

## 2018-11-18 ENCOUNTER — Ambulatory Visit (INDEPENDENT_AMBULATORY_CARE_PROVIDER_SITE_OTHER): Payer: 59 | Admitting: Family Medicine

## 2018-11-18 ENCOUNTER — Encounter: Payer: Self-pay | Admitting: Family Medicine

## 2018-11-18 VITALS — BP 102/64 | HR 67

## 2018-11-18 DIAGNOSIS — F988 Other specified behavioral and emotional disorders with onset usually occurring in childhood and adolescence: Secondary | ICD-10-CM | POA: Diagnosis not present

## 2018-11-18 MED ORDER — LISDEXAMFETAMINE DIMESYLATE 70 MG PO CAPS: 70.0000 mg | ORAL_CAPSULE | Freq: Every day | ORAL | 0 refills | Status: DC

## 2018-11-18 MED ORDER — VILAZODONE HCL 10 MG PO TABS: 10.0000 mg | ORAL_TABLET | Freq: Every day | ORAL | 3 refills | Status: AC

## 2018-11-18 NOTE — Telephone Encounter (Signed)
Ok perfect thank you.
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
## 2018-12-03 ENCOUNTER — Encounter: Payer: Self-pay | Admitting: Family Medicine

## 2018-12-30 ENCOUNTER — Encounter: Payer: Self-pay | Admitting: Family Medicine

## 2018-12-30 ENCOUNTER — Ambulatory Visit (INDEPENDENT_AMBULATORY_CARE_PROVIDER_SITE_OTHER): Payer: 59 | Admitting: Family Medicine

## 2018-12-30 ENCOUNTER — Other Ambulatory Visit: Payer: Self-pay

## 2018-12-30 DIAGNOSIS — S83241D Other tear of medial meniscus, current injury, right knee, subsequent encounter: Secondary | ICD-10-CM

## 2018-12-30 NOTE — Assessment & Plan Note (Signed)
Patient doing remarkably better.  No signs of any impingement or locking at the moment.  Encourage patient to increase to phase 2 exercises with increasing range of motion, strengthening, focusing on hip abductor and VMO strengthening.  Follow-up again in 8 weeks

## 2018-12-30 NOTE — Patient Instructions (Signed)
Good to see you!  Keep going See me again in 6 weeks
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
## 2019-01-01 ENCOUNTER — Ambulatory Visit: Payer: 59

## 2019-01-08 DIAGNOSIS — L4050 Arthropathic psoriasis, unspecified: Secondary | ICD-10-CM

## 2019-01-08 HISTORY — DX: Arthropathic psoriasis, unspecified: L40.50

## 2019-01-20 ENCOUNTER — Ambulatory Visit: Payer: 59 | Admitting: Obstetrics and Gynecology

## 2019-01-20 ENCOUNTER — Encounter: Payer: Self-pay | Admitting: Obstetrics and Gynecology

## 2019-01-20 ENCOUNTER — Other Ambulatory Visit: Payer: Self-pay

## 2019-01-20 VITALS — BP 102/66 | HR 78 | Temp 97.6°F | Resp 14 | Ht 64.0 in

## 2019-01-20 DIAGNOSIS — N898 Other specified noninflammatory disorders of vagina: Secondary | ICD-10-CM

## 2019-01-20 DIAGNOSIS — Z01419 Encounter for gynecological examination (general) (routine) without abnormal findings: Secondary | ICD-10-CM

## 2019-01-20 DIAGNOSIS — Z30431 Encounter for routine checking of intrauterine contraceptive device: Secondary | ICD-10-CM | POA: Diagnosis not present

## 2019-01-20 NOTE — Patient Instructions (Signed)
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
## 2019-01-20 NOTE — Addendum Note (Signed)
Addended by: Dorothy Spark on: 01/20/2019 05:05 PM   Modules accepted: Orders

## 2019-01-21 LAB — VAGINITIS/VAGINOSIS, DNA PROBE
Candida Species: NEGATIVE
Gardnerella vaginalis: NEGATIVE
Trichomonas vaginosis: NEGATIVE

## 2019-02-09 ENCOUNTER — Other Ambulatory Visit: Payer: Self-pay

## 2019-02-09 ENCOUNTER — Ambulatory Visit
Admission: RE | Admit: 2019-02-09 | Discharge: 2019-02-09 | Disposition: A | Discharge status: Home / Self Care | Payer: 59 | Source: Ambulatory Visit | Attending: Family Medicine | Admitting: Family Medicine

## 2019-02-09 ENCOUNTER — Other Ambulatory Visit: Payer: Self-pay | Admitting: Family Medicine

## 2019-02-09 DIAGNOSIS — Z1239 Encounter for other screening for malignant neoplasm of breast: Secondary | ICD-10-CM

## 2019-02-10 ENCOUNTER — Ambulatory Visit: Payer: 59 | Admitting: Family Medicine

## 2019-02-11 ENCOUNTER — Other Ambulatory Visit: Payer: Self-pay | Admitting: Family Medicine

## 2019-02-11 DIAGNOSIS — R928 Other abnormal and inconclusive findings on diagnostic imaging of breast: Secondary | ICD-10-CM

## 2019-02-12 ENCOUNTER — Other Ambulatory Visit: Payer: Self-pay | Admitting: Family Medicine

## 2019-02-12 ENCOUNTER — Other Ambulatory Visit: Payer: Self-pay

## 2019-02-12 ENCOUNTER — Ambulatory Visit
Admission: RE | Admit: 2019-02-12 | Discharge: 2019-02-12 | Disposition: A | Discharge status: Home / Self Care | Payer: 59 | Source: Ambulatory Visit | Attending: Family Medicine | Admitting: Family Medicine

## 2019-02-12 DIAGNOSIS — R928 Other abnormal and inconclusive findings on diagnostic imaging of breast: Secondary | ICD-10-CM

## 2019-02-12 DIAGNOSIS — N6489 Other specified disorders of breast: Secondary | ICD-10-CM

## 2019-02-12 DIAGNOSIS — F988 Other specified behavioral and emotional disorders with onset usually occurring in childhood and adolescence: Secondary | ICD-10-CM

## 2019-02-12 MED ORDER — LISDEXAMFETAMINE DIMESYLATE 70 MG PO CAPS: 70.0000 mg | ORAL_CAPSULE | Freq: Every day | ORAL | 0 refills | Status: DC

## 2019-02-12 NOTE — Telephone Encounter (Signed)
Dr. Loletha Grayer please advise last OV 11/18/2018 last refill 5/13 #[phone] 0 refills

## 2019-03-22 ENCOUNTER — Ambulatory Visit: Payer: 59 | Admitting: Family Medicine
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
## 2019-04-16 ENCOUNTER — Encounter: Payer: Self-pay | Admitting: Family Medicine

## 2019-04-16 ENCOUNTER — Other Ambulatory Visit: Payer: Self-pay

## 2019-04-16 ENCOUNTER — Ambulatory Visit: Payer: 59 | Admitting: Family Medicine

## 2019-04-16 ENCOUNTER — Ambulatory Visit: Payer: Self-pay

## 2019-04-16 VITALS — BP 110/60 | HR 83 | Ht 64.0 in | Wt 124.0 lb

## 2019-04-16 DIAGNOSIS — M25511 Pain in right shoulder: Secondary | ICD-10-CM

## 2019-04-16 DIAGNOSIS — M75111 Incomplete rotator cuff tear or rupture of right shoulder, not specified as traumatic: Secondary | ICD-10-CM | POA: Diagnosis not present

## 2019-04-16 DIAGNOSIS — G8929 Other chronic pain: Secondary | ICD-10-CM | POA: Diagnosis not present

## 2019-04-16 MED ORDER — NITROGLYCERIN 0.1 MG/HR TD PT24: MEDICATED_PATCH | TRANSDERMAL | 12 refills | Status: DC

## 2019-04-16 NOTE — Patient Instructions (Addendum)
Good to see you Nitroglycerin Protocol   Apply 1/4 nitroglycerin patch to affected area daily.  Change position of patch within the affected area every 24 hours.  You may experience a headache during the first 1-2 weeks of using the patch, these should subside.  If you experience headaches after beginning nitroglycerin patch treatment, you may take your preferred over the counter pain reliever.  Another side effect of the nitroglycerin patch is skin irritation or rash related to patch adhesive.  Please notify our office if you develop more severe headaches or rash, and stop the patch.  Tendon healing with nitroglycerin patch may require 12 to 24 weeks depending on the extent of injury.  Men should not use if taking Viagra, Cialis, or Levitra.   Do not use if you have migraines or rosacea. Keep hands in peripheral vision Wall squats  Exercise 3 times a week  See me again in 5-6 weeks

## 2019-04-17 ENCOUNTER — Encounter: Payer: Self-pay | Admitting: Family Medicine

## 2019-04-17 DIAGNOSIS — M75101 Unspecified rotator cuff tear or rupture of right shoulder, not specified as traumatic: Secondary | ICD-10-CM | POA: Insufficient documentation

## 2019-04-17 NOTE — Assessment & Plan Note (Signed)
Partial rotator cuff tear.  Causing impingement.  Do believe patient will do well with conservative therapy, starting nitroglycerin patches, icing regimen, follow-up again 4 to 6 weeks

## 2019-04-19 ENCOUNTER — Encounter: Payer: Self-pay | Admitting: Family Medicine

## 2019-04-19 DIAGNOSIS — W548XXA Other contact with dog, initial encounter: Secondary | ICD-10-CM

## 2019-04-21 MED ORDER — AMOXICILLIN-POT CLAVULANATE 875-125 MG PO TABS: 1.0000 | ORAL_TABLET | Freq: Two times a day (BID) | ORAL | 0 refills | Status: AC

## 2019-04-28 ENCOUNTER — Encounter: Payer: Self-pay | Admitting: Family Medicine

## 2019-04-28 MED ORDER — ALPRAZOLAM 0.25 MG PO TABS: 0.2500 mg | ORAL_TABLET | Freq: Every evening | ORAL | 0 refills | Status: AC | PRN

## 2019-05-08 ENCOUNTER — Encounter: Payer: Self-pay | Admitting: Family Medicine

## 2019-05-11 ENCOUNTER — Encounter: Payer: Self-pay | Admitting: Family Medicine

## 2019-05-14 ENCOUNTER — Encounter: Payer: Self-pay | Admitting: Family Medicine

## 2019-05-14 ENCOUNTER — Ambulatory Visit: Payer: 59 | Admitting: Family Medicine

## 2019-05-14 ENCOUNTER — Other Ambulatory Visit: Payer: Self-pay

## 2019-05-14 DIAGNOSIS — F988 Other specified behavioral and emotional disorders with onset usually occurring in childhood and adolescence: Secondary | ICD-10-CM

## 2019-05-14 MED ORDER — LISDEXAMFETAMINE DIMESYLATE 70 MG PO CAPS: 70.0000 mg | ORAL_CAPSULE | Freq: Every day | ORAL | 0 refills | Status: AC
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
## 2019-05-20 ENCOUNTER — Ambulatory Visit (INDEPENDENT_AMBULATORY_CARE_PROVIDER_SITE_OTHER)
Admission: RE | Admit: 2019-05-20 | Discharge: 2019-05-20 | Disposition: A | Discharge status: Home / Self Care | Payer: 59 | Source: Ambulatory Visit | Attending: Family Medicine | Admitting: Family Medicine

## 2019-05-20 ENCOUNTER — Other Ambulatory Visit: Payer: Self-pay

## 2019-05-20 ENCOUNTER — Ambulatory Visit: Payer: 59 | Admitting: Family Medicine

## 2019-05-20 ENCOUNTER — Ambulatory Visit: Payer: Self-pay

## 2019-05-20 ENCOUNTER — Encounter: Payer: Self-pay | Admitting: Family Medicine

## 2019-05-20 VITALS — BP 122/78 | HR 66 | Ht 64.0 in | Wt 129.0 lb

## 2019-05-20 DIAGNOSIS — M25511 Pain in right shoulder: Secondary | ICD-10-CM

## 2019-05-20 DIAGNOSIS — G8929 Other chronic pain: Secondary | ICD-10-CM

## 2019-05-20 DIAGNOSIS — M75111 Incomplete rotator cuff tear or rupture of right shoulder, not specified as traumatic: Secondary | ICD-10-CM | POA: Diagnosis not present

## 2019-05-20 NOTE — Assessment & Plan Note (Signed)
Rotator cuff tear.  Patient does still have some mild retraction.  Still partial thickness.  Patient has good strength.  Start formal physical therapy, x-rays pending, discussed avoiding activities with hands at a peripheral vision.  Follow-up again in 4 to 8 weeks.  Follow-up again in 6 weeks and if continuing to have pain will need to consider MRI or possible injection.
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
## 2019-05-21 ENCOUNTER — Encounter: Payer: Self-pay | Admitting: Family Medicine

## 2019-05-28 ENCOUNTER — Other Ambulatory Visit: Payer: Self-pay

## 2019-05-28 ENCOUNTER — Ambulatory Visit: Payer: 59 | Attending: Family Medicine | Admitting: Physical Therapy

## 2019-05-28 DIAGNOSIS — M6281 Muscle weakness (generalized): Secondary | ICD-10-CM | POA: Diagnosis present

## 2019-05-28 DIAGNOSIS — R29898 Other symptoms and signs involving the musculoskeletal system: Secondary | ICD-10-CM | POA: Diagnosis present

## 2019-05-28 DIAGNOSIS — M25511 Pain in right shoulder: Secondary | ICD-10-CM | POA: Diagnosis not present

## 2019-05-28 DIAGNOSIS — R293 Abnormal posture: Secondary | ICD-10-CM | POA: Diagnosis present

## 2019-05-28 NOTE — Patient Instructions (Addendum)
    Home exercise program created by Keayra Graham, PT.  For questions, please contact [facility] via phone at [phone] or email at [email or web address]  [facility] [place] [address]  [address] [place], [place], [address] Phone: [phone]   Fax:  [phone]    
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
## 2019-06-08 ENCOUNTER — Ambulatory Visit: Payer: Managed Care, Other (non HMO) | Attending: Family Medicine

## 2019-06-08 ENCOUNTER — Other Ambulatory Visit: Payer: Self-pay

## 2019-06-08 DIAGNOSIS — R29898 Other symptoms and signs involving the musculoskeletal system: Secondary | ICD-10-CM | POA: Insufficient documentation

## 2019-06-08 DIAGNOSIS — M25511 Pain in right shoulder: Secondary | ICD-10-CM | POA: Diagnosis not present

## 2019-06-08 DIAGNOSIS — M6281 Muscle weakness (generalized): Secondary | ICD-10-CM | POA: Insufficient documentation

## 2019-06-08 DIAGNOSIS — R293 Abnormal posture: Secondary | ICD-10-CM | POA: Insufficient documentation
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
## 2019-06-10 ENCOUNTER — Other Ambulatory Visit: Payer: Self-pay

## 2019-06-10 ENCOUNTER — Encounter: Payer: Self-pay | Admitting: Physical Therapy

## 2019-06-10 ENCOUNTER — Ambulatory Visit: Payer: Managed Care, Other (non HMO) | Admitting: Physical Therapy

## 2019-06-10 DIAGNOSIS — M6281 Muscle weakness (generalized): Secondary | ICD-10-CM

## 2019-06-10 DIAGNOSIS — R29898 Other symptoms and signs involving the musculoskeletal system: Secondary | ICD-10-CM

## 2019-06-10 DIAGNOSIS — M25511 Pain in right shoulder: Secondary | ICD-10-CM | POA: Diagnosis not present

## 2019-06-10 DIAGNOSIS — R293 Abnormal posture: Secondary | ICD-10-CM
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
## 2019-06-10 NOTE — Patient Instructions (Signed)

## 2019-06-15 ENCOUNTER — Ambulatory Visit: Payer: Managed Care, Other (non HMO)

## 2019-06-15 ENCOUNTER — Other Ambulatory Visit: Payer: Self-pay

## 2019-06-15 DIAGNOSIS — M25511 Pain in right shoulder: Secondary | ICD-10-CM

## 2019-06-15 DIAGNOSIS — M6281 Muscle weakness (generalized): Secondary | ICD-10-CM

## 2019-06-15 DIAGNOSIS — R29898 Other symptoms and signs involving the musculoskeletal system: Secondary | ICD-10-CM

## 2019-06-15 DIAGNOSIS — R293 Abnormal posture: Secondary | ICD-10-CM
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
## 2019-06-17 ENCOUNTER — Encounter: Payer: 59 | Admitting: Physical Therapy

## 2019-06-18 ENCOUNTER — Ambulatory Visit: Payer: Managed Care, Other (non HMO) | Admitting: Physical Therapy

## 2019-06-22 ENCOUNTER — Other Ambulatory Visit: Payer: Self-pay

## 2019-06-22 ENCOUNTER — Ambulatory Visit: Payer: Managed Care, Other (non HMO)

## 2019-06-22 DIAGNOSIS — R29898 Other symptoms and signs involving the musculoskeletal system: Secondary | ICD-10-CM

## 2019-06-22 DIAGNOSIS — M6281 Muscle weakness (generalized): Secondary | ICD-10-CM

## 2019-06-22 DIAGNOSIS — M25511 Pain in right shoulder: Secondary | ICD-10-CM

## 2019-06-22 DIAGNOSIS — R293 Abnormal posture: Secondary | ICD-10-CM
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
## 2019-06-24 ENCOUNTER — Ambulatory Visit: Payer: Managed Care, Other (non HMO) | Admitting: Physical Therapy

## 2019-06-29 ENCOUNTER — Other Ambulatory Visit: Payer: Self-pay

## 2019-06-29 ENCOUNTER — Ambulatory Visit: Payer: Managed Care, Other (non HMO)

## 2019-06-29 DIAGNOSIS — R29898 Other symptoms and signs involving the musculoskeletal system: Secondary | ICD-10-CM

## 2019-06-29 DIAGNOSIS — R293 Abnormal posture: Secondary | ICD-10-CM

## 2019-06-29 DIAGNOSIS — M25511 Pain in right shoulder: Secondary | ICD-10-CM

## 2019-06-29 DIAGNOSIS — M6281 Muscle weakness (generalized): Secondary | ICD-10-CM
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
## 2019-06-30 ENCOUNTER — Ambulatory Visit (INDEPENDENT_AMBULATORY_CARE_PROVIDER_SITE_OTHER): Payer: Managed Care, Other (non HMO)

## 2019-06-30 ENCOUNTER — Ambulatory Visit: Payer: 59 | Admitting: Family Medicine

## 2019-06-30 ENCOUNTER — Encounter: Payer: Self-pay | Admitting: Family Medicine

## 2019-06-30 VITALS — BP 110/80 | HR 78 | Ht 64.0 in | Wt 126.0 lb

## 2019-06-30 DIAGNOSIS — M75111 Incomplete rotator cuff tear or rupture of right shoulder, not specified as traumatic: Secondary | ICD-10-CM

## 2019-06-30 DIAGNOSIS — G8929 Other chronic pain: Secondary | ICD-10-CM

## 2019-06-30 DIAGNOSIS — M25511 Pain in right shoulder: Secondary | ICD-10-CM

## 2019-06-30 NOTE — Assessment & Plan Note (Signed)
Has been making progress with physical therapy.  1 visit still ago.  Encourage patient has been do the home exercises fairly regularly but to avoid significant heavy lifting.  Injection given today secondary to the reactive bursitis that was also noted on ultrasound today.  I believe the patient will do relatively well with the home exercises and icing regimen.  Follow-up with me again in 4 to 8 weeks.

## 2019-06-30 NOTE — Patient Instructions (Addendum)
Good to see you Happy Holidays See me again in 6 weeks 
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
## 2019-07-08 ENCOUNTER — Ambulatory Visit: Payer: Managed Care, Other (non HMO) | Admitting: Physical Therapy

## 2019-07-23 ENCOUNTER — Telehealth: Payer: Self-pay | Admitting: Obstetrics and Gynecology

## 2019-07-23 DIAGNOSIS — Z01419 Encounter for gynecological examination (general) (routine) without abnormal findings: Secondary | ICD-10-CM

## 2019-07-23 NOTE — Telephone Encounter (Signed)
Patient would like appointment for iud removal and insertion. 

## 2019-07-23 NOTE — Telephone Encounter (Signed)
Spoke with pt. Pt states needing IUD exchange since Nov 2021 will be 5 years. Pt states would like appt in feb or march afternoons. Pt scheduled for 09/07/19 at 4pm with Dr Hyacinth Meeker. Pt instructed to take Motrin 800 mg with food and water one hour before procedure. Pt agreeable and verbalized understanding. Pt aware of call for benefits. Pt has AEX scheduled 01/2020 with Dr Oscar La.  Will route to Dr Hyacinth Meeker for review and will close encounter.  CC: Zachery Dauer for precert. Orders placed.

## 2019-07-27 ENCOUNTER — Telehealth: Payer: Self-pay | Admitting: Obstetrics & Gynecology

## 2019-07-27 NOTE — Telephone Encounter (Signed)
Call placed to get new insurance information. °

## 2019-08-12 NOTE — Telephone Encounter (Signed)
Call placed to convey benefits for iud exchange. Spoke with the patient and conveyed the benefits.Patient understands/agreeable with the benefits. Patient is aware of the cancellation policy. Appointment scheduled 09/07/19.

## 2019-08-20 ENCOUNTER — Ambulatory Visit
Admission: RE | Admit: 2019-08-20 | Discharge: 2019-08-20 | Disposition: A | Discharge status: Home / Self Care | Payer: Managed Care, Other (non HMO) | Source: Ambulatory Visit | Attending: Family Medicine | Admitting: Family Medicine

## 2019-08-20 ENCOUNTER — Other Ambulatory Visit: Payer: Self-pay | Admitting: Family Medicine

## 2019-08-20 ENCOUNTER — Other Ambulatory Visit: Payer: Self-pay

## 2019-08-20 DIAGNOSIS — N6489 Other specified disorders of breast: Secondary | ICD-10-CM

## 2019-09-01 ENCOUNTER — Encounter: Payer: Self-pay | Admitting: Family Medicine

## 2019-09-01 ENCOUNTER — Ambulatory Visit (INDEPENDENT_AMBULATORY_CARE_PROVIDER_SITE_OTHER): Payer: 59 | Admitting: Family Medicine

## 2019-09-01 ENCOUNTER — Other Ambulatory Visit: Payer: Self-pay

## 2019-09-01 DIAGNOSIS — S39011A Strain of muscle, fascia and tendon of abdomen, initial encounter: Secondary | ICD-10-CM | POA: Insufficient documentation

## 2019-09-01 DIAGNOSIS — S39013A Strain of muscle, fascia and tendon of pelvis, initial encounter: Secondary | ICD-10-CM

## 2019-09-01 NOTE — Assessment & Plan Note (Signed)
Mild, discussed compression, home exercises given, discussed avoiding high impact exercises for short time.  No true tear appreciated so I do not think nitroglycerin is necessary.  Follow-up again in 4 to 6 weeks

## 2019-09-01 NOTE — Patient Instructions (Signed)
Thigh compression  Ice after activity See me in 4-5 weeks
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
## 2019-09-07 ENCOUNTER — Encounter: Payer: Self-pay | Admitting: Obstetrics & Gynecology

## 2019-09-07 ENCOUNTER — Other Ambulatory Visit: Payer: Self-pay

## 2019-09-07 ENCOUNTER — Ambulatory Visit (INDEPENDENT_AMBULATORY_CARE_PROVIDER_SITE_OTHER): Payer: 59 | Admitting: Obstetrics & Gynecology

## 2019-09-07 VITALS — BP 110/68 | HR 72 | Temp 98.4°F | Resp 10 | Ht 64.0 in

## 2019-09-07 DIAGNOSIS — Z3009 Encounter for other general counseling and advice on contraception: Secondary | ICD-10-CM

## 2019-09-07 DIAGNOSIS — Z30433 Encounter for removal and reinsertion of intrauterine contraceptive device: Secondary | ICD-10-CM | POA: Diagnosis not present
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
## 2019-09-08 ENCOUNTER — Encounter: Payer: Self-pay | Admitting: Obstetrics & Gynecology

## 2019-09-08 ENCOUNTER — Telehealth: Payer: Self-pay | Admitting: Obstetrics & Gynecology

## 2019-09-08 DIAGNOSIS — Z30431 Encounter for routine checking of intrauterine contraceptive device: Secondary | ICD-10-CM

## 2019-09-08 DIAGNOSIS — R140 Abdominal distension (gaseous): Secondary | ICD-10-CM

## 2019-09-08 DIAGNOSIS — R102 Pelvic and perineal pain: Secondary | ICD-10-CM

## 2019-09-08 NOTE — Telephone Encounter (Signed)
Michele Turner "[place]"  [facility]  Phone Number: [phone]  Hey my friend,   It was nice seeing you yesterday. I was wondering if it is normal to have lower abdominal distention post IUD removal and replacement? It almost looks swollen. I know everybody has some distention to a point but this is a lot more than normal. I'm still cramping some but using Aleve 2x a day to manage it. What are tour thoughts?   Thanks

## 2019-09-08 NOTE — Telephone Encounter (Signed)
Please schedule an appointment for IUD check with ultrasound tomorrow and follow up with Dr. Hyacinth Meeker.   Cc- Dr. Hyacinth Meeker

## 2019-09-08 NOTE — Telephone Encounter (Signed)
Spoke with patient. Patient was seen in office on 09/07/19 for Mirena IUD exchange. Patient reports abdomen is more distended than previous IUD exchange. Abdomen is soft to touch, cramping 2/10 and spotting, does not require a pad. Denies fever/chills, N/V. Reports normal BMs. Patient took xanax 0.5 mg and Aleve when she got home yesterday and applied a heating pad, took 2 more Aleve and 1mg  of Xanax before bed. Patient reports she felt "ok " this morning, was just concerned about bloating.    Advised patient symptoms should resolve. If symptoms do not resolve or new symptoms develop, return call to office for OV. Advised patient to continue Aleve or Motrin PRN and use heating pad. Advised Dr. is out of the office today, will review with covering provider and f/u if any additional recommendations. Patient agreeable.   Routing to covering provider, Dr. Hyacinth Meeker.

## 2019-09-08 NOTE — Telephone Encounter (Signed)
Spoke with patient, advised as seen below per Dr. Edward Jolly. Patient agreeable to proceed with PUS. PUS scheduled for 3/4 at 8am, consult to follow with Dr. Hyacinth Meeker. Order placed for precert. Patient verbalizes understanding and is agreeable.   Message to business office for precert.   Routing to provider for final review. Patient is agreeable to disposition. Will close encounter.  Cc: Dr. Hyacinth Meeker, [facility]

## 2019-09-09 ENCOUNTER — Ambulatory Visit (INDEPENDENT_AMBULATORY_CARE_PROVIDER_SITE_OTHER): Payer: 59

## 2019-09-09 ENCOUNTER — Encounter: Payer: Self-pay | Admitting: Obstetrics & Gynecology

## 2019-09-09 ENCOUNTER — Ambulatory Visit (INDEPENDENT_AMBULATORY_CARE_PROVIDER_SITE_OTHER): Payer: 59 | Admitting: Obstetrics & Gynecology

## 2019-09-09 ENCOUNTER — Other Ambulatory Visit: Payer: Self-pay

## 2019-09-09 VITALS — BP 110/60 | HR 80 | Temp 97.7°F | Resp 12 | Ht 64.0 in

## 2019-09-09 DIAGNOSIS — R140 Abdominal distension (gaseous): Secondary | ICD-10-CM | POA: Diagnosis not present

## 2019-09-09 DIAGNOSIS — R102 Pelvic and perineal pain: Secondary | ICD-10-CM

## 2019-09-09 DIAGNOSIS — Z30431 Encounter for routine checking of intrauterine contraceptive device: Secondary | ICD-10-CM | POA: Diagnosis not present

## 2019-09-09 NOTE — Progress Notes (Signed)
44 y.o. G0P0 Married White or Caucasian female here for pelvic ultrasound due to increased abdominal bloating after IUD was placed on 09/07/2019.  She called yesterday with concerns about bloating.  Was not having fever.  Cramping from IUD insertion was improved but she was anxious about this.  Patient's last menstrual period was 08/27/2019.  Contraception: IUD  Findings:  UTERUS: 6.8 x 4.8 x 4.7cm EMS: 3.66mm and IUD is in correct location ADNEXA: Left ovary:  2.2 x 1.2 x 0.9cm       Right ovary: 2.8 x 1.5 x 1.4cm with 1.2cm follicle CUL DE SAC: no free fluid  Discussion:  Findings reviewed.  Pictures reviewed.  IUD is in correct location.  Possible causes of bloating discussed including new IUD with increased progesterone systemically.  If this is the cause, she will have decreasing symptoms over the next several days.  We did review risk of uterine perforation with IUD insertion but this does not seem the case as IUD is in correct location.  She has many questions which were addressed today.  .  Assessment:  Bloating after IUD insertion with correct placement of IUD noted on PUS today Right dominant ovarian follicle noted Anxiety  Plan:  She is going to monitor symptoms and give me an update in a week or so.  She knows to call back if symptoms don't diminish or if symptoms worsen.  She is reassured today.  In addition to reviewing images and discussing results, additional 20 minutes was spent with pt answering questions/addressing concerns she has about IUD placement including prevention of this next time IUD removal replacement occurs.

## 2019-09-28 ENCOUNTER — Encounter: Payer: Self-pay | Admitting: Obstetrics & Gynecology

## 2019-10-19 ENCOUNTER — Encounter: Payer: Self-pay | Admitting: Obstetrics & Gynecology

## 2019-10-20 ENCOUNTER — Telehealth: Payer: Self-pay

## 2019-10-20 NOTE — Telephone Encounter (Signed)
Tried calling patient to scheduled appointment for tomorrow 10/21/19, per Dr. Hyacinth Meeker. No answer. Left message for patient to call me back.

## 2019-10-21 NOTE — Telephone Encounter (Signed)
Spoke with patient. OV scheduled for 10/22/19 at 4pm with Dr. Hyacinth Meeker.  [id number] prescreen negative, precautions reviewed. Patient is agreeable to date and time.   Routing to provider for final review. Patient is agreeable to disposition. Will close encounter.

## 2019-10-22 ENCOUNTER — Encounter: Payer: Self-pay | Admitting: Obstetrics & Gynecology

## 2019-10-22 ENCOUNTER — Other Ambulatory Visit: Payer: Self-pay

## 2019-10-22 ENCOUNTER — Ambulatory Visit (INDEPENDENT_AMBULATORY_CARE_PROVIDER_SITE_OTHER): Payer: 59 | Admitting: Obstetrics & Gynecology

## 2019-10-22 VITALS — BP 110/68 | HR 68 | Temp 97.0°F | Resp 16

## 2019-10-22 DIAGNOSIS — Z3009 Encounter for other general counseling and advice on contraception: Secondary | ICD-10-CM | POA: Diagnosis not present

## 2019-10-22 DIAGNOSIS — E349 Endocrine disorder, unspecified: Secondary | ICD-10-CM
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
## 2019-10-25 ENCOUNTER — Encounter: Payer: Self-pay | Admitting: Obstetrics & Gynecology

## 2019-10-26 ENCOUNTER — Telehealth: Payer: Self-pay | Admitting: Obstetrics & Gynecology

## 2019-10-26 NOTE — Telephone Encounter (Signed)
Routing to Dr. Hyacinth Meeker to advise on surgery planning.   Cc: Nolen Mu, RN, Webb Silversmith

## 2019-10-26 NOTE — Telephone Encounter (Signed)
Visit Follow-Up Question Received: Yesterday Message Contents  Rachell, Druckenmiller "[name]" sent to [facility]  Phone Number: [phone]  Dr Hyacinth Meeker,   First of all I wanted to apologize for taking up so much of your time on Friday. I felt horrible that you still had two patients left after me. I hope it didn't ruin your weekend or Friday night.   I am so very appreciative of all you do for me. When can we schedule this tubal ligation? Do I need to check my insurance?   Thanks so much

## 2019-10-27 NOTE — Telephone Encounter (Signed)
Pt is interested in having IUD removed so she can stop having any hormones in her body and proceeding with tubal excision/ligation.  I would prefer bilateral salpingectomy for her if possible for contraception.  Ok to proceed with scheduling.  I would remove IUD in the OR at the same time as tubal excision procedure.    Noreene Larsson, please let pt know we will precert this and then contact for scheduling.

## 2019-10-27 NOTE — Telephone Encounter (Signed)
Call to patient. Advised of recommendations from Dr. Hyacinth Meeker. Discussed Lap BS versus Lap BTSP with IUD removal.  Recovery time reviewed.  Patient desires to proceed. Will await call from business office with insurance information.  Encounter closed.

## 2019-11-01 ENCOUNTER — Telehealth: Payer: Self-pay | Admitting: Obstetrics & Gynecology
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
## 2019-11-01 NOTE — Telephone Encounter (Signed)
Call to patient. Per DPR, OK to leave message on voicemail.   Left voicemail requesting a return call to [place] to review benefits for recommended surgery with M. Suzanne Miller, MD    

## 2019-11-01 NOTE — Telephone Encounter (Addendum)
Spoke with patient regarding surgery benefits. Patient acknowledges understanding of information presented. Patient is aware that benefits presented are for professional benefits only. Patient is aware that once surgery is scheduled, the hospital will call with separate benefits. Patient is aware of surgery cancellation policy.  Patient to call back tomorrow morning to proceed with scheduling. Patient would like to proceed with Lap BS.

## 2019-11-02 ENCOUNTER — Telehealth: Payer: Self-pay

## 2019-11-02 ENCOUNTER — Other Ambulatory Visit: Payer: Self-pay

## 2019-11-02 NOTE — Telephone Encounter (Signed)
Patient canceled 8 week recheck for IUD. Patient stated she was seen not too long ago and feels like she does not need to keep this appointment.

## 2019-11-02 NOTE — Progress Notes (Signed)
Erroneous encounter

## 2019-11-02 NOTE — Telephone Encounter (Signed)
Spoke with pt. Pt had called to cancel IUD recheck due to being seen in OV on 10/22/19.  Pt wanting to discuss more before surgery. Pt wanting to know if IUD removal with Lap BS would cause her to go in menopause early.  Advised pt will review with Dr Hyacinth Meeker and return call to pt with recommendations/advice. Pt agreeable.   Routing to Dr Hyacinth Meeker.

## 2019-11-03 NOTE — Telephone Encounter (Signed)
Left message to call Keeley Sussman at [phone]. 

## 2019-11-04 ENCOUNTER — Ambulatory Visit: Payer: Self-pay | Admitting: Obstetrics & Gynecology

## 2019-11-04 NOTE — Telephone Encounter (Signed)
No, that procedure should not affect the timing of menopause.  Thanks.

## 2019-11-05 NOTE — Telephone Encounter (Signed)
Spoke with pt. Pt given recommendations per Dr Hyacinth Meeker. Pt agreeable.   Routing to Dr Hyacinth Meeker for review.  Encounter closed.

## 2019-11-12 NOTE — Telephone Encounter (Signed)
Spoke with patient. Patient would like to proceed with surgery on 12/13/2019. Lap BS scheduled for 12/13/2019 at 0730 at [facility]. Pre op scheduled for 11/22/2019 at 9 am with Dr.Miller. COVID test scheduled for 12/09/2019 at 3:05 pm at [facility] location. Patient is aware of the need to quarantine after test until surgery. 2 week post op scheduled for 12/27/2019 at 2:30 pm with Dr.Miller. Patient is agreeable to all dates and times. Surgery instructions reviewed with patient and to Joy to give to patient at her pre op appointment.

## 2019-11-12 NOTE — Telephone Encounter (Signed)
Patient returning call.

## 2019-11-15 NOTE — Telephone Encounter (Signed)
Left message to call Michele Turner at [phone].  Post op needs to be moved to 4 weeks post surgery.  Per Dr.Miller patient can not perform lifting workouts for 4 weeks post op.

## 2019-11-15 NOTE — Telephone Encounter (Signed)
Patient returned a call to Kaitlyn. °

## 2019-11-15 NOTE — Telephone Encounter (Signed)
Spoke with patient. Post op moved to 01/13/2020 at 1 pm with Dr.Miller. Patient is agreeable to date and time. Patient verbalizes understanding of workout restrictions.  Routing to provider and will close encounter.

## 2019-11-17 ENCOUNTER — Other Ambulatory Visit: Payer: Self-pay | Admitting: Obstetrics & Gynecology

## 2019-11-19 ENCOUNTER — Other Ambulatory Visit: Payer: Self-pay

## 2019-11-22 ENCOUNTER — Other Ambulatory Visit: Payer: Self-pay

## 2019-11-22 ENCOUNTER — Ambulatory Visit (INDEPENDENT_AMBULATORY_CARE_PROVIDER_SITE_OTHER): Payer: 59 | Admitting: Obstetrics & Gynecology

## 2019-11-22 ENCOUNTER — Other Ambulatory Visit (HOSPITAL_COMMUNITY)
Admission: RE | Admit: 2019-11-22 | Discharge: 2019-11-22 | Disposition: A | Discharge status: Home / Self Care | Payer: 59 | Source: Ambulatory Visit | Attending: Obstetrics & Gynecology | Admitting: Obstetrics & Gynecology

## 2019-11-22 ENCOUNTER — Encounter: Payer: Self-pay | Admitting: Obstetrics & Gynecology

## 2019-11-22 VITALS — BP 112/76 | HR 80 | Temp 97.3°F | Resp 10 | Ht 64.0 in

## 2019-11-22 DIAGNOSIS — Z124 Encounter for screening for malignant neoplasm of cervix: Secondary | ICD-10-CM

## 2019-11-22 DIAGNOSIS — Z3009 Encounter for other general counseling and advice on contraception: Secondary | ICD-10-CM
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
## 2019-11-23 LAB — CYTOLOGY - PAP
Comment: NEGATIVE
Diagnosis: NEGATIVE
High risk HPV: NEGATIVE

## 2019-11-25 ENCOUNTER — Encounter: Payer: Self-pay | Admitting: Obstetrics & Gynecology

## 2019-11-29 ENCOUNTER — Other Ambulatory Visit: Payer: Self-pay | Admitting: Family Medicine

## 2019-11-30 NOTE — Telephone Encounter (Signed)
Last OV 05/14/19 Last fill 11/18/18  #90/3 Pt need an office visit.

## 2019-12-01 ENCOUNTER — Other Ambulatory Visit: Payer: Self-pay | Admitting: Family Medicine

## 2019-12-03 ENCOUNTER — Other Ambulatory Visit: Payer: Self-pay

## 2019-12-03 ENCOUNTER — Encounter (HOSPITAL_BASED_OUTPATIENT_CLINIC_OR_DEPARTMENT_OTHER): Payer: Self-pay | Admitting: Obstetrics & Gynecology

## 2019-12-03 NOTE — Progress Notes (Signed)
Spoke w/ via phone for pre-op interview---patient Lab needs dos----    Cbc, urine preg          COVID test ------12-09-2019@305  pm Arrive at -------530 am 12-13-2019 NPO after ------midnight Medications to take morning of surgery -----pepcid, viibyrd, zyrtec Diabetic medication -----n/a Patient Special Instructions -----pt  instructed dr Hyacinth Meeker to stop 81 mg asa 7 days before surgery Pre-Op special Istructions -----none Patient verbalized understanding of instructions that were given at this phone interview. Patient denies shortness of breath, chest pain, fever, cough a this phone interview.

## 2019-12-05 ENCOUNTER — Encounter: Payer: Self-pay | Admitting: Obstetrics & Gynecology

## 2019-12-07 ENCOUNTER — Telehealth: Payer: Self-pay

## 2019-12-07 ENCOUNTER — Telehealth: Payer: Self-pay | Admitting: *Deleted

## 2019-12-07 NOTE — Telephone Encounter (Signed)
Per review of Epic, patient is scheduled for a laparoscopic bilateral salpingectomy on 12/13/19.   Routing to Dr. Hyacinth Meeker to review MyChart message and advise on post-op restrictions.

## 2019-12-07 NOTE — Telephone Encounter (Signed)
Spoke with patient. Patient would like to cancel her surgery for 12/13/2019. Patient has decided she would like to think about this longer. Feels she made a quick decision without weighing options. Would like to cancel surgery and keep IUD at this time. Aware of surgery cancellation fee.  Routing to provider and will close encounter.

## 2019-12-07 NOTE — Telephone Encounter (Signed)
Michele Fling "Nicholson"  [facility]  Phone Number: [phone]  Hey Dr Hyacinth Meeker,  The countdown is on for June 7th. My personal trainer asked how long should I expect to be unable to work out with her. We don't do cardio our focus is on weights.. like deadlifts,squats, rows, bench press and shoulder press. No Abs at all. I also have gone back to [employer] and that is mainly cardio (with a weight lifting component mainly with dumbells and body weight), but I only power walk on an incline. Do I need to put a freeze on my membership for these places or can I just hold off for a week or two?   Thank you so much   [name]

## 2019-12-07 NOTE — Telephone Encounter (Signed)
Patient would like to cancel surgery

## 2019-12-07 NOTE — Telephone Encounter (Signed)
I don't want her to do anything except light cardio for a week and no weights for four weeks.  She should put these on hold for one month.  I can write a letter if needed.

## 2019-12-07 NOTE — Telephone Encounter (Signed)
Call to patient, no answer, left detailed message, ok per dpr, name identified on voicemail. Advised as seen below per Dr. Hyacinth Meeker. Advised to return call to office or send MyChart message if any additional questions. If letter is needed, return call to advise where letter will need to go.   Encounter closed.

## 2019-12-09 ENCOUNTER — Other Ambulatory Visit (HOSPITAL_COMMUNITY): Payer: Self-pay

## 2019-12-13 ENCOUNTER — Ambulatory Visit (HOSPITAL_BASED_OUTPATIENT_CLINIC_OR_DEPARTMENT_OTHER): Admission: RE | Admit: 2019-12-13 | Payer: 59 | Source: Home / Self Care | Admitting: Obstetrics & Gynecology

## 2019-12-13 HISTORY — DX: Dermatitis, unspecified: L30.9

## 2019-12-13 HISTORY — DX: Attention-deficit hyperactivity disorder, unspecified type: F90.9

## 2019-12-13 HISTORY — DX: Hypothyroidism, unspecified: E03.9

## 2019-12-13 SURGERY — SALPINGECTOMY, BILATERAL, LAPAROSCOPIC
Anesthesia: Choice

## 2019-12-27 ENCOUNTER — Ambulatory Visit: Payer: 59 | Admitting: Obstetrics & Gynecology

## 2020-01-13 ENCOUNTER — Ambulatory Visit: Payer: 59 | Admitting: Obstetrics & Gynecology

## 2020-01-26 ENCOUNTER — Ambulatory Visit: Payer: 59 | Admitting: Obstetrics and Gynecology

## 2020-01-31 ENCOUNTER — Ambulatory Visit: Payer: 59 | Admitting: Obstetrics and Gynecology

## 2020-05-25 ENCOUNTER — Other Ambulatory Visit: Payer: Self-pay

## 2020-05-25 ENCOUNTER — Encounter: Payer: Self-pay | Admitting: Family Medicine

## 2020-05-25 ENCOUNTER — Ambulatory Visit: Payer: 59 | Admitting: Family Medicine

## 2020-05-25 ENCOUNTER — Ambulatory Visit (INDEPENDENT_AMBULATORY_CARE_PROVIDER_SITE_OTHER): Payer: 59

## 2020-05-25 ENCOUNTER — Ambulatory Visit: Payer: Self-pay

## 2020-05-25 VITALS — BP 114/78 | HR 76 | Ht 64.0 in | Wt 132.0 lb

## 2020-05-25 DIAGNOSIS — R1032 Left lower quadrant pain: Secondary | ICD-10-CM

## 2020-05-25 NOTE — Patient Instructions (Addendum)
Thank you for coming in today.  Get xray today.   Work with PT on hip flexor strain.   If you cannot get in with [facility] is a good option. Let me know if you need a referral.   If not improving let me know. I would recommend MRI arthrogram.  Usually at [facility].

## 2020-05-25 NOTE — Progress Notes (Signed)
I, Michele Turner, LAT, ATC, am serving as scribe for Dr. Clementeen Graham.  Michele Turner is a 44 y.o. female who presents to [facility] at [facility] today for L hip pain.  She was last seen by Dr. [name] on 09/01/19 for L groin pain that began after doing mountain climbers as part of a workout.  Since then, pt reports L hip/groin pain since Sunday w/ no known MOI.  She locates her pain to her L groin.  She notes some point somebody told her that she has femoral acetabular impingement possibly.  Radiating pain: no Swelling: no Aggravating factors: squats, lunges, RDLs, straight leg raises; walking uphill: L hip flexion, aBd and IR Treatments tried: dry needling; compression; quad stretching   Pertinent review of systems: No fevers or chills  Relevant historical information: Prior hip flexor injury right groin February 2021   Exam:  BP 114/78 (BP Location: Left Arm, Patient Position: Sitting, Cuff Size: Normal)   Pulse 76   Ht 5\' 4"  (1.626 m)   Wt 132 lb (59.9 kg)   SpO2 99%   BMI 22.66 kg/m  General: Well Developed, well nourished, and in no acute distress.   MSK: Left hip normal-appearing nontender normal motion normal strength.  No pain with FABER or FADIR test Some pain with resisted hip flexion especially with leg externally rotated. Normal gait. Leg lengths equal    Lab and Radiology Results X-ray images left hip obtained today personally and independently interpreted No fracture or significant malalignment. Tiny calcification at tip of acetabulum indicates possible chronic labrum tear. Await formal radiology interpretation  Diagnostic Limited MSK Ultrasound of: Left hip No hip effusion.  Normal-appearing muscular and tendinous structures anterior hip Impression: No large hip effusion     Assessment and Plan: 44 y.o. female with left hip pain thought to be due to hip flexor strain.  Fortunately not ongoing for very long.  X-ray does  support possibility of mild impingement but does not show severe hip impingement findings per my interpretation.  Plan for physical therapy already established.  Could switch to [facility] if needed.  If not improving patient will notify me we will proceed with MRI arthrogram to evaluate for hip impingement and labral tear.   PDMP not reviewed this encounter. Orders Placed This Encounter  Procedures  . 45 LIMITED JOINT SPACE STRUCTURES LOW LEFT(NO LINKED CHARGES)    Order Specific Question:   Reason for Exam (SYMPTOM  OR DIAGNOSIS REQUIRED)    Answer:   L groin pain    Order Specific Question:   Preferred imaging location?    Answer:   [facility]  . DG HIP UNILAT WITH PELVIS 2-3 VIEWS LEFT    Standing Status:   Future    Number of Occurrences:   1    Standing Expiration Date:   05/25/2021    Order Specific Question:   Reason for Exam (SYMPTOM  OR DIAGNOSIS REQUIRED)    Answer:   eval left hip    Order Specific Question:   Is patient pregnant?    Answer:   No    Order Specific Question:   Preferred imaging location?    Answer:   05/27/2021   No orders of the defined types were placed in this encounter.    Discussed warning signs or symptoms. Please see discharge instructions. Patient expresses understanding.   The above documentation has been reviewed and is accurate and complete Kyra Searles, M.D.

## 2020-05-29 NOTE — Progress Notes (Signed)
Xray shows no fracture or severe arthritis. There is a small bony fragment at the tip of the socket that may represent a labrum tear. If not better I will get a MRI arthrogram which will show everything in more detail.

## 2020-07-10 NOTE — Progress Notes (Unsigned)
I, [name], LAT, ATC, am serving as scribe for Dr. Clementeen Graham.  Michele Turner is a 45 y.o. female who presents to [facility] at [facility] today for R shoulder/upper back pain.  She was last seen by Dr. Denyse Amass on 05/25/20 for her L hip/groin and was referred to PT.  Since her last visit, pt reports pain started Sunday morning, 1/2, when she woke up. No known MOI. Pt sleeps on her back. She locates her pain to trap, lat, lateral border of scapula. Pt reports increased pain with cervical rotation to the R and cervical flexion. No pain w/ shoulder AROM.  Overall symptoms are improving today compared to yesterday.  She is physically active and does resistance training most of the days of the week.  She notes that the cramping that I saw her for in November has almost completely resolved however she still has some pain off and on in the groin.  Radiating pain: sometimes- posterior aspect ofupper arm R shoulder mechanical symptoms: no Aggravating factors: breathing Treatments tried: dry needling, massage, NSAIDS  Diagnostic testing: L hip XR- 05/25/20; R shoulder XR- 05/20/19   Pertinent review of systems: No fevers or chills.  No shortness of breath cough or congestion.  No chest pain or palpitations.  Relevant historical information: History of DVT   Exam:  BP 122/76 (BP Location: Right Arm, Patient Position: Sitting, Cuff Size: Normal)   Pulse 90   Ht 5\' 4"  (1.626 m)   Wt 132 lb (59.9 kg)   SpO2 97%   BMI 22.66 kg/m  General: Well Developed, well nourished, and in no acute distress.   MSK: C-spine normal-appearing normal cervical motion. Nontender cervical midline. Tender palpation right trapezius and rhomboid. Upper extremity strength reflexes and sensation are equal and normal throughout.  Scapular motion normal bilaterally.   Lab and Radiology Results    EXAM: CERVICAL SPINE  4+ VIEWS  COMPARISON:  None.  FINDINGS: There is  no fracture or subluxation or prevertebral soft tissue swelling. Slight reversal of the cervical lordosis with moderate degenerative disc disease at C5-6. No facet arthritis. No foraminal stenosis.  IMPRESSION: No acute abnormalities.  Degenerative disc disease at C5-6.   Electronically Signed   By: M.D.   On: 12/28/2014 16:12 I, 12/30/2014, personally (independently) visualized and performed the interpretation of the images attached in this note.     Assessment and Plan: 45 y.o. female with left trapezius and rhomboid/periscapular pain ongoing for a few days.  Pain was quite severe yesterday but is improving a bit today.  Differential includes rhomboid or trapezius dysfunction as well as latissimus dorsi dysfunction and spasm.  Additionally differential includes T1 radiculopathy.  I think the muscle spasm and dysfunction is more likely than a radiculopathy.  Discussed options.  She already has access to physical therapy and dry needling which I think is ultimately her best option.  Recommend heating pad TENS unit.  Will prescribe low-dose tizanidine to try as well with not very optimistic about this medicine.  For the possibility that this is radiculopathy I have prescribed prednisone and gabapentin.  She will try the gabapentin here and they are not with tizanidine at first to see if it helps any.  If not improving can take the prednisone.  If not improving overall patient will notify me proceed with next steps including possibly advanced imaging.   PDMP not reviewed this encounter. No orders of the defined types were placed in this  encounter.  Meds ordered this encounter  Medications  . gabapentin (NEURONTIN) 300 MG capsule    Sig: Take 1 capsule (300 mg total) by mouth 3 (three) times daily as needed.    Dispense:  90 capsule    Refill:  3  . tizanidine (ZANAFLEX) 2 MG capsule    Sig: Take 1-2 capsules (2-4 mg total) by mouth 3 (three) times daily as needed  for muscle spasms.    Dispense:  60 capsule    Refill:  1  . predniSONE (DELTASONE) 50 MG tablet    Sig: Take 1 tablet (50 mg total) by mouth daily.    Dispense:  5 tablet    Refill:  0     Discussed warning signs or symptoms. Please see discharge instructions. Patient expresses understanding.   The above documentation has been reviewed and is accurate and complete Clementeen Graham, M.D.

## 2020-07-11 ENCOUNTER — Ambulatory Visit (INDEPENDENT_AMBULATORY_CARE_PROVIDER_SITE_OTHER): Payer: 59 | Admitting: Family Medicine

## 2020-07-11 ENCOUNTER — Other Ambulatory Visit: Payer: Self-pay

## 2020-07-11 VITALS — BP 122/76 | HR 90 | Ht 64.0 in | Wt 132.0 lb

## 2020-07-11 DIAGNOSIS — M7918 Myalgia, other site: Secondary | ICD-10-CM

## 2020-07-11 MED ORDER — TIZANIDINE HCL 2 MG PO CAPS: 2.0000 mg | ORAL_CAPSULE | Freq: Three times a day (TID) | ORAL | 1 refills | Status: DC | PRN

## 2020-07-11 MED ORDER — GABAPENTIN 300 MG PO CAPS: 300.0000 mg | ORAL_CAPSULE | Freq: Three times a day (TID) | ORAL | 3 refills | Status: DC | PRN

## 2020-07-11 MED ORDER — PREDNISONE 50 MG PO TABS: 50.0000 mg | ORAL_TABLET | Freq: Every day | ORAL | 0 refills | Status: DC

## 2020-07-11 NOTE — Patient Instructions (Addendum)
Thank you for coming in today.  Plan to continue PT.  Let me know if you need a formal order.   Use heat and TENS unit.   Try the tizanidine muscle relaxer mostly at bedtime.   Try the gabapentin for nerve pain again mostly at bedtime.  At first do not take the tizanidine and gabapentin together.   If not improving ok to take the prednisone.

## 2020-09-11 ENCOUNTER — Ambulatory Visit: Payer: 59 | Admitting: Physician Assistant

## 2020-09-11 ENCOUNTER — Encounter: Payer: Self-pay | Admitting: Physician Assistant

## 2020-09-11 VITALS — BP 128/78 | HR 87 | Ht 64.0 in | Wt 135.0 lb

## 2020-09-11 DIAGNOSIS — R1013 Epigastric pain: Secondary | ICD-10-CM

## 2020-09-11 DIAGNOSIS — K219 Gastro-esophageal reflux disease without esophagitis: Secondary | ICD-10-CM | POA: Diagnosis not present

## 2020-09-11 NOTE — Progress Notes (Signed)
Reviewed and agree with management plans. ? ?Devlynn Knoff L. Lucille Crichlow, MD, MPH  ?
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
## 2020-09-11 NOTE — Patient Instructions (Signed)
If you are age 43 or older, your body mass index should be between 23-30. Your Body mass index is 23.17 kg/m. If this is out of the aforementioned range listed, please consider follow up with your Primary Care Provider.  If you are age 66 or younger, your body mass index should be between 19-25. Your Body mass index is 23.17 kg/m. If this is out of the aformentioned range listed, please consider follow up with your Primary Care Provider.   Please purchase the following medications over the counter and take as directed: Omeprazole 20 mg daily 30-60 minutes for 2 weeks.  Can also use DGL Licorice & Aloe Juice.   Call if not any better, ask for [facility].

## 2020-09-25 ENCOUNTER — Ambulatory Visit (HOSPITAL_BASED_OUTPATIENT_CLINIC_OR_DEPARTMENT_OTHER): Payer: 59 | Admitting: Obstetrics & Gynecology

## 2020-10-17 ENCOUNTER — Ambulatory Visit
Admission: RE | Admit: 2020-10-17 | Discharge: 2020-10-17 | Disposition: A | Discharge status: Home / Self Care | Payer: 59 | Source: Ambulatory Visit | Attending: Family Medicine | Admitting: Family Medicine

## 2020-10-17 ENCOUNTER — Encounter (HOSPITAL_COMMUNITY): Payer: 59

## 2020-10-17 ENCOUNTER — Other Ambulatory Visit: Payer: Self-pay

## 2020-10-17 DIAGNOSIS — N6489 Other specified disorders of breast: Secondary | ICD-10-CM

## 2020-11-14 ENCOUNTER — Ambulatory Visit (HOSPITAL_BASED_OUTPATIENT_CLINIC_OR_DEPARTMENT_OTHER): Payer: 59 | Admitting: Obstetrics & Gynecology

## 2021-03-06 ENCOUNTER — Telehealth: Payer: Self-pay | Admitting: Physician Assistant

## 2021-03-06 NOTE — Telephone Encounter (Signed)
Lm on vm for patient to return call 

## 2021-03-06 NOTE — Telephone Encounter (Addendum)
Spoke with patient in regards to recommendations. Pt was advised to go to ED if she had worsening pain, pt asks"What are they gonna do?". Advised patient that the ED could order things more urgently than we can as outpatient. Pt has been scheduled for a follow up with Hyacinth Meeker, PA-C on Monday, 04/09/21 at 2 PM. Pt verbalized understanding of all information and had no concerns at the end of the call.

## 2021-03-06 NOTE — Telephone Encounter (Signed)
Spoke with patient, she reports that she has had an episode like she used to. She states that it feels like an "alien is in her intestines". Patient states that she has been having a small amount of heartburn, sweating, pens/needles feeling, along with lower abdominal cramping/pain. Pt reports that she has been taking Famotidine and Bentyl to see if that would help. Pt states that she had to take 3 Bentyl yesterday, she is not sure if she is on 10 mg or 20 mg. She states that the pain was so bad yesterday that she was in the fetal position and developed severe nausea which caused vomiting. Pt states that she never vomits and wonders if she may have Norovirus or may have developed lactose intolerance because she has been eating a lot of cheese. She states that she had 3 episodes of watery diarrhea last night. She states that the Bentyl helps some but does not seem as effective as it used to be. Denies any fever or new foods. Next available appt is not until October. Please advise, thanks.

## 2021-03-06 NOTE — Telephone Encounter (Signed)
Inbound call from patient stating she has been experiencing severe abdominal pain and cramping.  Informed patient next availability wont be until beginning of October but is requesting to be seen sooner.  Please advise.

## 2021-04-09 ENCOUNTER — Ambulatory Visit: Payer: 59 | Admitting: Physician Assistant

## 2021-04-09 ENCOUNTER — Encounter: Payer: Self-pay | Admitting: Physician Assistant

## 2021-04-09 VITALS — BP 121/68 | HR 74 | Ht 64.0 in | Wt 139.6 lb

## 2021-04-09 DIAGNOSIS — R1013 Epigastric pain: Secondary | ICD-10-CM | POA: Diagnosis not present

## 2021-04-09 DIAGNOSIS — R194 Change in bowel habit: Secondary | ICD-10-CM | POA: Diagnosis not present

## 2021-04-09 DIAGNOSIS — K219 Gastro-esophageal reflux disease without esophagitis: Secondary | ICD-10-CM

## 2021-04-09 MED ORDER — NA SULFATE-K SULFATE-MG SULF 17.5-3.13-1.6 GM/177ML PO SOLN: 1.0000 | Freq: Once | ORAL | 0 refills | Status: AC

## 2021-04-09 NOTE — Patient Instructions (Signed)
You have been scheduled for an endoscopy and colonoscopy. Please follow the written instructions given to you at your visit today. Please pick up your prep supplies at the pharmacy within the next 1-3 days. If you use inhalers (even only as needed), please bring them with you on the day of your procedure.  If you are age 43 or older, your body mass index should be between 23-30. Your Body mass index is 23.96 kg/m. If this is out of the aforementioned range listed, please consider follow up with your Primary Care Provider.  If you are age 76 or younger, your body mass index should be between 19-25. Your Body mass index is 23.96 kg/m. If this is out of the aformentioned range listed, please consider follow up with your Primary Care Provider.   __________________________________________________________  The [facility] providers would like to encourage you to use [facility] to communicate with providers for non-urgent requests or questions.  Due to long hold times on the telephone, sending your provider a message by [facility] may be a faster and more efficient way to get a response.  Please allow 48 business hours for a response.  Please remember that this is for non-urgent requests.
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
## 2021-04-10 NOTE — Progress Notes (Signed)
Reviewed and agree with management plans. ? ?Leatha Rohner L. Niv Darley, MD, MPH  ?

## 2021-04-16 ENCOUNTER — Telehealth: Payer: Self-pay | Admitting: Physician Assistant

## 2021-04-16 NOTE — Telephone Encounter (Signed)
Spoke with patient and advised that she will be able to keep procedure as scheduled. Advised that she will need to continue the diet as directed moving forward. Pt verbalized understanding and had no concerns at the end of the call.

## 2021-04-16 NOTE — Telephone Encounter (Signed)
Inbound call from pt requesting a call back stating she is scheduled for a procedure on 04/18/21 and she ate beans and corn over the weekend by mistake. Pt wanted to know if she could still have her procedure. Please advise. Thank you.

## 2021-04-18 ENCOUNTER — Other Ambulatory Visit: Payer: Self-pay

## 2021-04-18 ENCOUNTER — Ambulatory Visit (AMBULATORY_SURGERY_CENTER): Payer: 59 | Admitting: Gastroenterology

## 2021-04-18 ENCOUNTER — Encounter: Payer: Self-pay | Admitting: Gastroenterology

## 2021-04-18 VITALS — BP 122/67 | HR 67 | Temp 97.1°F | Resp 15 | Ht 64.0 in | Wt 139.0 lb

## 2021-04-18 DIAGNOSIS — R194 Change in bowel habit: Secondary | ICD-10-CM | POA: Diagnosis not present

## 2021-04-18 DIAGNOSIS — K449 Diaphragmatic hernia without obstruction or gangrene: Secondary | ICD-10-CM

## 2021-04-18 DIAGNOSIS — K209 Esophagitis, unspecified without bleeding: Secondary | ICD-10-CM

## 2021-04-18 DIAGNOSIS — R197 Diarrhea, unspecified: Secondary | ICD-10-CM

## 2021-04-18 DIAGNOSIS — K3189 Other diseases of stomach and duodenum: Secondary | ICD-10-CM

## 2021-04-18 DIAGNOSIS — K219 Gastro-esophageal reflux disease without esophagitis: Secondary | ICD-10-CM

## 2021-04-18 DIAGNOSIS — K2100 Gastro-esophageal reflux disease with esophagitis, without bleeding: Secondary | ICD-10-CM | POA: Diagnosis not present

## 2021-04-18 DIAGNOSIS — R1013 Epigastric pain: Secondary | ICD-10-CM

## 2021-04-18 MED ORDER — SODIUM CHLORIDE 0.9 % IV SOLN: 500.0000 mL | Freq: Once | INTRAVENOUS | Status: DC

## 2021-04-18 MED ORDER — PANTOPRAZOLE SODIUM 40 MG PO TBEC: 40.0000 mg | DELAYED_RELEASE_TABLET | Freq: Two times a day (BID) | ORAL | 0 refills | Status: DC

## 2021-04-18 NOTE — Progress Notes (Signed)
Pt's states no medical or surgical changes since previsit or office visit. 

## 2021-04-18 NOTE — Op Note (Signed)
[facility] Patient Name: Michele Turner Procedure Date: 04/18/2021 10:35 AM MRN: [id number] Endoscopist: Tressia Danas MD, MD Age: 45 Referring MD:  Date of Birth: 1976/01/03 Gender: Female Account #: [id number] Procedure:                Upper GI endoscopy Indications:              Abdominal pain, Diarrhea Medicines:                Monitored Anesthesia Care Procedure:                Pre-Anesthesia Assessment:                           - Prior to the procedure, a History and Physical                            was performed, and patient medications and                            allergies were reviewed. The patient's tolerance of                            previous anesthesia was also reviewed. The risks                            and benefits of the procedure and the sedation                            options and risks were discussed with the patient.                            All questions were answered, and informed consent                            was obtained. Prior Anticoagulants: The patient has                            taken no previous anticoagulant or antiplatelet                            agents. ASA Grade Assessment: II - A patient with                            mild systemic disease. After reviewing the risks                            and benefits, the patient was deemed in                            satisfactory condition to undergo the procedure.                           After obtaining informed consent, the endoscope was  passed under direct vision. Throughout the                            procedure, the patient's blood pressure, pulse, and                            oxygen saturations were monitored continuously. The                            Endoscope was introduced through the mouth, and                            advanced to the third part of duodenum. The upper                            GI endoscopy was  accomplished without difficulty.                            The patient tolerated the procedure well. Scope In: Scope Out: Findings:                 LA Class A reflux esophagitis. The Z-line was found                            36 cm from the incisors. Biopsies were taken from                            the mid/proximal and distal esophagus with a cold                            forceps for histology. Estimated blood loss was                            minimal.                           The entire examined stomach was normal. A small                            hiatal hernia is present. Biopsies were taken from                            the antrum, body, and fundus. with a cold forceps                            for histology. Estimated blood loss was minimal.                           The examined duodenum was normal. Biopsies were                            taken with a cold forceps for histology. Estimated  blood loss was minimal.                           The cardia and gastric fundus were normal on                            retroflexion.                           The exam was otherwise without abnormality. Complications:            No immediate complications. Estimated blood loss:                            Minimal. Estimated Blood Loss:     Estimated blood loss was minimal. Impression:               - LA Class A reflux esophagitis. Biopsied.                           - Normal stomach. Biopsied.                           - Small hiatal hernia.                           - Normal examined duodenum. Biopsied.                           - The examination was otherwise normal. Recommendation:           - Patient has a contact number available for                            emergencies. The signs and symptoms of potential                            delayed complications were discussed with the                            patient. Return to normal activities  tomorrow.                            Written discharge instructions were provided to the                            patient.                           - Resume previous diet.                           - Continue present medications.                           - Start pantoprazole 40 mg BID x 10 weeks.                           -  Reflux lifestyle modifications.                           - Await pathology results. Tressia Danas MD, MD 04/18/2021 10:52:04 AM This report has been signed electronically.
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
## 2021-04-18 NOTE — Progress Notes (Signed)
Called to room to assist during endoscopic procedure.  Patient ID and intended procedure confirmed with present staff. Received instructions for my participation in the procedure from the performing physician.  

## 2021-04-18 NOTE — Patient Instructions (Signed)
Discharge instructions given. Handouts on Hiatal Hernia and Esophagitis. Resume previous medications. Prescription sent to pharmacy. YOU HAD AN ENDOSCOPIC PROCEDURE TODAY AT THE [facility]:   Refer to the procedure report that was given to you for any specific questions about what was found during the examination.  If the procedure report does not answer your questions, please call your gastroenterologist to clarify.  If you requested that your care partner not be given the details of your procedure findings, then the procedure report has been included in a sealed envelope for you to review at your convenience later.  YOU SHOULD EXPECT: Some feelings of bloating in the abdomen. Passage of more gas than usual.  Walking can help get rid of the air that was put into your GI tract during the procedure and reduce the bloating. If you had a lower endoscopy (such as a colonoscopy or flexible sigmoidoscopy) you may notice spotting of blood in your stool or on the toilet paper. If you underwent a bowel prep for your procedure, you may not have a normal bowel movement for a few days.  Please Note:  You might notice some irritation and congestion in your nose or some drainage.  This is from the oxygen used during your procedure.  There is no need for concern and it should clear up in a day or so.  SYMPTOMS TO REPORT IMMEDIATELY:  Following lower endoscopy (colonoscopy or flexible sigmoidoscopy):  Excessive amounts of blood in the stool  Significant tenderness or worsening of abdominal pains  Swelling of the abdomen that is new, acute  Fever of 100F or higher  Following upper endoscopy (EGD)  Vomiting of blood or coffee ground material  New chest pain or pain under the shoulder blades  Painful or persistently difficult swallowing  New shortness of breath  Fever of 100F or higher  Black, tarry-looking stools  For urgent or emergent issues, a gastroenterologist can be reached at any hour  by calling [phone]. Do not use MyChart messaging for urgent concerns.    DIET:  We do recommend a small meal at first, but then you may proceed to your regular diet.  Drink plenty of fluids but you should avoid alcoholic beverages for 24 hours.  ACTIVITY:  You should plan to take it easy for the rest of today and you should NOT DRIVE or use heavy machinery until tomorrow (because of the sedation medicines used during the test).    FOLLOW UP: Our staff will call the number listed on your records 48-72 hours following your procedure to check on you and address any questions or concerns that you may have regarding the information given to you following your procedure. If we do not reach you, we will leave a message.  We will attempt to reach you two times.  During this call, we will ask if you have developed any symptoms of COVID 19. If you develop any symptoms (ie: fever, flu-like symptoms, shortness of breath, cough etc.) before then, please call [phone].  If you test positive for Covid 19 in the 2 weeks post procedure, please call and report this information to [place].    If any biopsies were taken you will be contacted by phone or by letter within the next 1-3 weeks.  Please call us at [phone] if you have not heard about the biopsies in 3 weeks.    SIGNATURES/CONFIDENTIALITY: You and/or your care partner have signed paperwork which will be entered into your electronic medical  record.  These signatures attest to the fact that that the information above on your After Visit Summary has been reviewed and is understood.  Full responsibility of the confidentiality of this discharge information lies with you and/or your care-partner.

## 2021-04-18 NOTE — Progress Notes (Signed)
Report to PACU, RN, vss, BBS= Clear.  

## 2021-04-18 NOTE — Op Note (Signed)
[facility] Patient Name: Michele Turner Procedure Date: 04/18/2021 10:33 AM MRN: [id number] Endoscopist: Tressia Danas MD, MD Age: 45 Referring MD:  Date of Birth: 01/25/1976 Gender: Female Account #: [id number] Procedure:                Colonoscopy Indications:              Abdominal pain, Clinically significant diarrhea of                            unexplained origin Medicines:                Monitored Anesthesia Care Procedure:                Pre-Anesthesia Assessment:                           - Prior to the procedure, a History and Physical                            was performed, and patient medications and                            allergies were reviewed. The patient's tolerance of                            previous anesthesia was also reviewed. The risks                            and benefits of the procedure and the sedation                            options and risks were discussed with the patient.                            All questions were answered, and informed consent                            was obtained. Prior Anticoagulants: The patient has                            taken no previous anticoagulant or antiplatelet                            agents. ASA Grade Assessment: II - A patient with                            mild systemic disease. After reviewing the risks                            and benefits, the patient was deemed in                            satisfactory condition to undergo the procedure.  After obtaining informed consent, the colonoscope                            was passed under direct vision. Throughout the                            procedure, the patient's blood pressure, pulse, and                            oxygen saturations were monitored continuously. The                            PCF-HQ190L Colonoscope was introduced through the                            anus and advanced to the 10  cm into the ileum. The                            colonoscopy was performed with moderate difficulty                            due to a redundant colon. Successful completion of                            the procedure was aided by applying abdominal                            pressure. The patient tolerated the procedure well.                            The quality of the bowel preparation was excellent.                            The terminal ileum, ileocecal valve, appendiceal                            orifice, and rectum were photographed. Scope In: 10:52:59 AM Scope Out: 11:05:06 AM Scope Withdrawal Time: 0 hours 8 minutes 32 seconds  Total Procedure Duration: 0 hours 12 minutes 7 seconds  Findings:                 The perianal and digital rectal examinations were                            normal.                           The entire examined colonic mucosa appeared normal.                            Biopsies were taken from the right colon,                            transverse colon, and left colon with a cold  forceps for histology. Estimated blood loss was                            minimal.                           The terminal ileum appeared normal. Biopsies were                            taken with a cold forceps for histology. Estimated                            blood loss was minimal.                           The exam was otherwise without abnormality on                            direct and retroflexion views. Complications:            No immediate complications. Estimated blood loss:                            Minimal. Estimated Blood Loss:     Estimated blood loss was minimal. Impression:               - The entire examined colon is normal. Biopsied.                           - The examined portion of the ileum was normal.                            Biopsied.                           - The examination was otherwise normal on direct                             and retroflexion views. Recommendation:           - Patient has a contact number available for                            emergencies. The signs and symptoms of potential                            delayed complications were discussed with the                            patient. Return to normal activities tomorrow.                            Written discharge instructions were provided to the                            patient.                           -  Resume previous diet.                           - Continue present medications.                           - Await pathology results.                           - Repeat colonoscopy in 10 years for screening                            purposes, earlier with new symptoms.                           - Emerging evidence supports eating a diet of                            fruits, vegetables, grains, calcium, and yogurt                            while reducing red meat and alcohol may reduce the                            risk of colon cancer. Tressia Danas MD, MD 04/18/2021 11:08:49 AM This report has been signed electronically.

## 2021-04-20 ENCOUNTER — Telehealth: Payer: Self-pay

## 2021-04-20 NOTE — Telephone Encounter (Signed)
Second post procedure follow up call, no answer 

## 2021-04-20 NOTE — Telephone Encounter (Signed)
Attempted f/u call. No answer, left VM. 

## 2021-05-01 ENCOUNTER — Other Ambulatory Visit: Payer: Self-pay

## 2021-05-01 ENCOUNTER — Telehealth: Payer: Self-pay

## 2021-05-01 DIAGNOSIS — K580 Irritable bowel syndrome with diarrhea: Secondary | ICD-10-CM

## 2021-05-01 MED ORDER — RIFAXIMIN 550 MG PO TABS: 550.0000 mg | ORAL_TABLET | Freq: Three times a day (TID) | ORAL | 0 refills | Status: AC

## 2021-05-01 NOTE — Telephone Encounter (Signed)
PRIOR AUTHORIZATION  PA initiation date: 05/01/21  Medication: Xifaxan Insurance Company: [employer] Submission completed electronically through [employer] My Meds: Yes  Will await insurance response re: approval/denial.  Azariyah Levay (Key: [id number]) Xifaxan 550MG  tablets   Form [employer] Non-Medicare Xifaxan 550mg  Form  Plan Contact [phone] phone [phone] fax Created 6 minutes ago Sent to Plan 1 minute ago Determination

## 2021-05-02 NOTE — Telephone Encounter (Signed)
NOT REQUIRED  Medication: Xifaxan Insurance Company: [employer] PA response: NOT REQUIRED Approval dates: N/A - not indicated on letter Misc. Notes: Letter received, labeled and sent to HIM for scanning purposes and for our future references

## 2021-05-14 ENCOUNTER — Ambulatory Visit (INDEPENDENT_AMBULATORY_CARE_PROVIDER_SITE_OTHER): Payer: 59

## 2021-05-14 ENCOUNTER — Other Ambulatory Visit: Payer: Self-pay

## 2021-05-14 ENCOUNTER — Encounter: Payer: Self-pay | Admitting: Cardiology

## 2021-05-14 ENCOUNTER — Ambulatory Visit: Payer: 59 | Admitting: Cardiology

## 2021-05-14 VITALS — BP 118/72 | HR 81 | Ht 64.0 in | Wt 130.6 lb

## 2021-05-14 DIAGNOSIS — R002 Palpitations: Secondary | ICD-10-CM | POA: Diagnosis not present

## 2021-05-14 DIAGNOSIS — I82402 Acute embolism and thrombosis of unspecified deep veins of left lower extremity: Secondary | ICD-10-CM

## 2021-05-14 DIAGNOSIS — I80292 Phlebitis and thrombophlebitis of other deep vessels of left lower extremity: Secondary | ICD-10-CM | POA: Diagnosis not present
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
## 2021-05-14 NOTE — Progress Notes (Unsigned)
7 day ZIO XT serial # [id number] from office inventory given to patient.

## 2021-05-14 NOTE — Patient Instructions (Signed)
Medication Instructions:    Not needed  *If you need a refill on your cardiac medications before your next appointment, please call your pharmacy*   Lab Work:  Not needed    Testing/Procedures:  Will be mailed to you 5 to 10 days Your physician has recommended that you wear a holter monitor 7 days. Zio Holter monitors are medical devices that record the heart's electrical activity. Doctors most often use these monitors to diagnose arrhythmias. Arrhythmias are problems with the speed or rhythm of the heartbeat. The monitor is a small, portable device. You can wear one while you do your normal daily activities. This is usually used to diagnose what is causing palpitations/syncope (passing out).   Follow-Up: At [facility], you and your health needs are our priority.  As part of our continuing mission to provide you with exceptional heart care, we have created designated Provider Care Teams.  These Care Teams include your primary Cardiologist (physician) and Advanced Practice Providers (APPs -  Physician Assistants and Nurse Practitioners) who all work together to provide you with the care you need, when you need it.     Your next appointment:   2 month(s)  The format for your next appointment:   In Person  Provider:   Dr Michail Jewels- Long Term Monitor Instructions  Your physician has requested you wear a ZIO patch monitor for 7 days.  This is a single patch monitor. [employer] supplies one patch monitor per enrollment. Additional stickers are not available. Please do not apply patch if you will be having a Nuclear Stress Test,  Echocardiogram, Cardiac CT, MRI, or Chest Xray during the period you would be wearing the  monitor. The patch cannot be worn during these tests. You cannot remove and re-apply the  ZIO XT patch monitor.  Your ZIO patch monitor will be mailed 3 day USPS to your address on file. It may take 3-5 days  to receive your monitor after you have been  enrolled.  Once you have received your monitor, please review the enclosed instructions. Your monitor  has already been registered assigning a specific monitor serial # to you.  Billing and Patient Assistance Program Information  We have supplied [employer] with any of your insurance information on file for billing purposes. [employer] offers a sliding scale Patient Assistance Program for patients that do not have  insurance, or whose insurance does not completely cover the cost of the ZIO monitor.  You must apply for the Patient Assistance Program to qualify for this discounted rate.  To apply, please call [employer] at [phone], select option 4, select option 2, ask to apply for  Patient Assistance Program. Meredeth Ide will ask your household income, and how many people  are in your household. They will quote your out-of-pocket cost based on that information.  [employer] will also be able to set up a 53-month, interest-free payment plan if needed.  Applying the monitor   Shave hair from upper left chest.  Hold abrader disc by orange tab. Rub abrader in 40 strokes over the upper left chest as  indicated in your monitor instructions.  Clean area with 4 enclosed alcohol pads. Let dry.  Apply patch as indicated in monitor instructions. Patch will be placed under collarbone on left  side of chest with arrow pointing upward.  Rub patch adhesive wings for 2 minutes. Remove white label marked "1". Remove the white  label marked "2". Rub patch adhesive wings for 2 additional minutes.  While looking in a mirror, press and release button in center of patch. A small green light will  flash 3-4 times. This will be your only indicator that the monitor has been turned on.  Do not shower for the first 24 hours. You may shower after the first 24 hours.  Press the button if you feel a symptom. You will hear a small click. Record Date, Time and  Symptom in the Patient Logbook.  When you are ready to remove the  patch, follow instructions on the last 2 pages of Patient  Logbook. Stick patch monitor onto the last page of Patient Logbook.  Place Patient Logbook in the blue and white box. Use locking tab on box and tape box closed  securely. The blue and white box has prepaid postage on it. Please place it in the mailbox as  soon as possible. Your physician should have your test results approximately 7 days after the  monitor has been mailed back to [facility].  Call [facility] Customer Care at [phone] if you have questions regarding  your ZIO XT patch monitor. Call them immediately if you see an orange light blinking on your  monitor.  If your monitor falls off in less than 4 days, contact our Monitor department at [phone].  If your monitor becomes loose or falls off after 4 days call [employer] at [phone] for  suggestions on securing your monitor

## 2021-05-19 ENCOUNTER — Encounter: Payer: Self-pay | Admitting: Cardiology

## 2021-05-19 NOTE — Assessment & Plan Note (Signed)
No further episodes.  She does have some mild discoloration in her legs that may be related to venous stasis.  This could be possibly changes, but she has not had any major issues since 2014.  To consider lower extremity venous Dopplers to evaluate for reflux, but not overly worrisome to her at this point.

## 2021-05-19 NOTE — Assessment & Plan Note (Addendum)
Sounds like she is having brief tachycardic episodes which are mostly be related to anxiety or panic attacks.  Sounds like more sinus tachycardia than true arrhythmia.   Will evaluate with an event monitor-7 days  Encouraged maintaining hydration and avoiding caffeine/sweets.

## 2021-05-19 NOTE — Assessment & Plan Note (Signed)
No further evidence of DVT since 2014.  Has been on anticoagulation until 2017.  No longer taking DOAC or warfarin.  Started on aspirin.

## 2021-05-26 ENCOUNTER — Other Ambulatory Visit: Payer: Self-pay | Admitting: Gastroenterology

## 2021-05-26 DIAGNOSIS — K209 Esophagitis, unspecified without bleeding: Secondary | ICD-10-CM

## 2021-05-26 DIAGNOSIS — R1013 Epigastric pain: Secondary | ICD-10-CM

## 2021-05-26 DIAGNOSIS — K219 Gastro-esophageal reflux disease without esophagitis: Secondary | ICD-10-CM

## 2021-06-06 ENCOUNTER — Encounter: Payer: Self-pay | Admitting: Gastroenterology

## 2021-06-06 ENCOUNTER — Ambulatory Visit: Payer: 59 | Admitting: Gastroenterology

## 2021-06-06 VITALS — BP 118/76 | HR 91 | Ht 64.0 in | Wt 128.0 lb

## 2021-06-06 DIAGNOSIS — K219 Gastro-esophageal reflux disease without esophagitis: Secondary | ICD-10-CM

## 2021-06-06 DIAGNOSIS — R1013 Epigastric pain: Secondary | ICD-10-CM

## 2021-06-06 MED ORDER — SUCRALFATE 1 GM/10ML PO SUSP: 1.0000 g | Freq: Four times a day (QID) | ORAL | 1 refills | Status: DC

## 2021-06-06 NOTE — Patient Instructions (Addendum)
I recommend that you continue the pantoprazole 40 mg twice daily. You may continue to use famotidine 20 mg twice daily as you need to. I am recommending a trial of Carafate 1g up to four times daily.  Some people find herbs and other natural remedies, in addition to licorice and aloe vera juice, are helpful in treating heartburn symptoms.  Chamomile. A cup of chamomile tea may have a soothing effect on the digestive tract. Avoid chamomile if you're allergic to ragweed.  Ginger. The root of the ginger plant is another well-known herbal digestive aid and has been a folk remedy for heartburn for centuries.  Other natural remedies. A variety of other remedies have been used over the centuries, but not enough scientific studies have been done to confirm their effectiveness. Catnip, fennel, marshmallow root, and papaya tea have all been said to aid in digestion and act as a buffer to stop heartburn. Some people eat fresh papaya as a digestive aid. Others swear by raw potato juice, three times a day. However, these remedies have not been reviewed for safety or effectiveness by the FDA.   Please let me know how you are feeling after trying the Carafate for least 3-4 weeks.  If you are not feeling better, we will proceed with further evaluation of your gallbladder.  Please let me know if you have any other questions or concerns in the meantime.
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
## 2021-06-18 ENCOUNTER — Other Ambulatory Visit: Payer: Self-pay

## 2021-06-18 MED ORDER — SUCRALFATE 1 G PO TABS: 1.0000 g | ORAL_TABLET | Freq: Three times a day (TID) | ORAL | 2 refills | Status: AC

## 2021-06-22 ENCOUNTER — Telehealth: Payer: Self-pay | Admitting: *Deleted

## 2021-06-22 ENCOUNTER — Telehealth (INDEPENDENT_AMBULATORY_CARE_PROVIDER_SITE_OTHER): Payer: 59 | Admitting: Cardiology

## 2021-06-22 ENCOUNTER — Other Ambulatory Visit: Payer: Self-pay

## 2021-06-22 ENCOUNTER — Encounter: Payer: Self-pay | Admitting: Cardiology

## 2021-06-22 DIAGNOSIS — R002 Palpitations: Secondary | ICD-10-CM | POA: Diagnosis not present
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
## 2021-06-22 NOTE — Patient Instructions (Addendum)
Medication Instructions:  none *If you need a refill on your cardiac medications before your next appointment, please call your pharmacy*   Lab Work: none    Testing/Procedures: none   Follow-Up: At [facility], you and your health needs are our priority.  As part of our continuing mission to provide you with exceptional heart care, we have created designated Provider Care Teams.  These Care Teams include your primary Cardiologist (physician) and Advanced Practice Providers (APPs -  Physician Assistants and Nurse Practitioners) who all work together to provide you with the care you need, when you need it.    Your next appointment:   PRN  worsening symptoms  The format for your next appointment:   In Person  Provider:   None     Other Instructions  GREAT NEWS ON THE MONITOR RESULTS!!!  HAPPY HOLIDAYS!!! MERRY CHRISTMAS!!!

## 2021-06-22 NOTE — Telephone Encounter (Signed)
°  Patient Consent for Virtual Visit        Michele Turner has provided verbal consent on 06/22/2021 for a virtual visit (video or telephone).   CONSENT FOR VIRTUAL VISIT FOR:  Michele Turner  By participating in this virtual visit I agree to the following:  I hereby voluntarily request, consent and authorize [facility] and its employed or contracted physicians, physician assistants, nurse practitioners or other licensed health care professionals (the Practitioner), to provide me with telemedicine health care services (the Services") as deemed necessary by the treating Practitioner. I acknowledge and consent to receive the Services by the Practitioner via telemedicine. I understand that the telemedicine visit will involve communicating with the Practitioner through live audiovisual communication technology and the disclosure of certain medical information by electronic transmission. I acknowledge that I have been given the opportunity to request an in-person assessment or other available alternative prior to the telemedicine visit and am voluntarily participating in the telemedicine visit.  I understand that I have the right to withhold or withdraw my consent to the use of telemedicine in the course of my care at any time, without affecting my right to future care or treatment, and that the Practitioner or I may terminate the telemedicine visit at any time. I understand that I have the right to inspect all information obtained and/or recorded in the course of the telemedicine visit and may receive copies of available information for a reasonable fee.  I understand that some of the potential risks of receiving the Services via telemedicine include:  Delay or interruption in medical evaluation due to technological equipment failure or disruption; Information transmitted may not be sufficient (e.g. poor resolution of images) to allow for appropriate medical decision making  by the Practitioner; and/or  In rare instances, security protocols could fail, causing a breach of personal health information.  Furthermore, I acknowledge that it is my responsibility to provide information about my medical history, conditions and care that is complete and accurate to the best of my ability. I acknowledge that Practitioner's advice, recommendations, and/or decision may be based on factors not within their control, such as incomplete or inaccurate data provided by me or distortions of diagnostic images or specimens that may result from electronic transmissions. I understand that the practice of medicine is not an exact science and that Practitioner makes no warranties or guarantees regarding treatment outcomes. I acknowledge that a copy of this consent can be made available to me via my patient portal [facility] MyChart), or I can request a printed copy by calling the office of [facility].    I understand that my insurance will be billed for this visit.   I have read or had this consent read to me. I understand the contents of this consent, which adequately explains the benefits and risks of the Services being provided via telemedicine.  I have been provided ample opportunity to ask questions regarding this consent and the Services and have had my questions answered to my satisfaction. I give my informed consent for the services to be provided through the use of telemedicine in my medical care

## 2021-06-22 NOTE — Telephone Encounter (Signed)
RN spoke to patient. Instruction were given  from today's virtual visit 06/22/21 .  AVS SUMMARY has been sent by mychart . ° ° Patient verbalized understanding  °

## 2021-06-22 NOTE — Assessment & Plan Note (Signed)
Monitor did not show any episodes of atrial runs or PAT/SVT.  Although Sjoberg sinus rhythm.  She did note symptoms so this would suggest that she is feeling sinus tachycardia. Ectopic P waves discussed and explained.  Patient was reassured. Encouraged her to maintain adequate hydration and avoid triggers such as caffeine and sweets. Also discussed vagal maneuvers.

## 2021-07-24 ENCOUNTER — Ambulatory Visit (INDEPENDENT_AMBULATORY_CARE_PROVIDER_SITE_OTHER): Payer: 59

## 2021-07-24 ENCOUNTER — Ambulatory Visit: Payer: Self-pay

## 2021-07-24 ENCOUNTER — Ambulatory Visit: Payer: 59 | Admitting: Family Medicine

## 2021-07-24 ENCOUNTER — Other Ambulatory Visit: Payer: Self-pay

## 2021-07-24 ENCOUNTER — Encounter: Payer: Self-pay | Admitting: Family Medicine

## 2021-07-24 VITALS — BP 110/72 | HR 81 | Ht 64.0 in | Wt 128.4 lb

## 2021-07-24 DIAGNOSIS — M25561 Pain in right knee: Secondary | ICD-10-CM | POA: Diagnosis not present

## 2021-07-24 DIAGNOSIS — M25512 Pain in left shoulder: Secondary | ICD-10-CM

## 2021-07-24 DIAGNOSIS — G8929 Other chronic pain: Secondary | ICD-10-CM

## 2021-07-24 NOTE — Progress Notes (Signed)
I, Michele Turner, LAT, ATC, am serving as scribe for Dr. Lynne Leader.  Michele Turner is a 46 y.o. female who presents to [place] at [facility] today for R knee pain.  She was last seen by Dr. Georgina Snell on 07/11/20 for rhomboid pain.  Today, pt reports R knee pain x 4 months that has been progressively worsening over the last week.  She locates her pain to below her patella and also along the medial and lateral borders.  R knee swelling: no R knee mechanical symptoms: yes, grinding Aggravating factors: loaded squatting; lunges Treatments tried: Aleve;   Additionally she notes some left lateral shoulder pain associate with some popping with overhead motion.  This produces little bit of pain.  Also occurs with weight lifting.  No radiating pain below the level of the elbow.  No weakness or numbness distally.  Pertinent review of systems: no fever or chills  Relevant historical information: Avid weightlifter.  History of DVT.   Exam:  BP 110/72 (BP Location: Left Arm, Patient Position: Sitting, Cuff Size: Normal)    Pulse 81    Ht 5\' 4"  (1.626 m)    Wt 128 lb 6.4 oz (58.2 kg)    SpO2 99%    BMI 22.04 kg/m  General: Well Developed, well nourished, and in no acute distress.   MSK: Right knee normal-appearing no significant joint effusion. Normal motion. Tender palpation anterior knee. Stable ligamentous exam. Negative Murray's test. Intact strength. Hip abduction strength is intact.  Left shoulder normal-appearing Nontender. Normal shoulder motion. Intact strength. Positive Hawkins and Neer's test.  Negative Yergason's and speeds test.    Lab and Radiology Results  Diagnostic Limited MSK Ultrasound of: Right knee and left shoulder Right knee: Tendon is intact and normal-appearing Small joint effusion is present. Patellar tendon is normal-appearing Medial lateral joint lines are normal-appearing No Baker's cyst. Left shoulder: Normal biceps  tendon.  Subscapularis tendon normal. Supraspinatus tendon is intact.   Moderate subacromial bursitis is present. Infraspinatus tendon is normal-appearing AC joint mild effusion Impression: Right knee mild joint effusion otherwise normal-appearing Left shoulder moderate subacromial bursitis.  X-ray images right knee and left shoulder obtained today personally and independently interpreted  Right knee: No severe DJD.  No acute fractures.  Left shoulder: No cute fractures.  No significant degenerative changes.  Await formal radiology review    Assessment and Plan: 46 y.o. female with  Right knee pain thought to be patellofemoral pain syndrome versus chondromalacia patella. Plan for home exercise program taught in clinic today by ATC.  Additionally discussed modification of weightlifting.  Recommend avoid deep knee bends or squats below 90 degrees knee flexion.  Left shoulder pain due to subacromial bursitis and impingement.  Again home exercise program taught in clinic today by ATC.  If not improving consider injection.  Recheck in about 6 weeks.   [id number]; 15 additional minutes spent for Therapeutic exercises as stated in above notes.  This included exercises focusing on stretching, strengthening, with significant focus on eccentric aspects.   Long term goals include an improvement in range of motion, strength, endurance as well as avoiding reinjury. Patient's frequency would include in 1-2 times a day, 3-5 times a week for a duration of 6-12 weeks.  Proper technique shown and discussed handout in great detail with ATC.  All questions were discussed and answered.    PDMP not reviewed this encounter. Orders Placed This Encounter  Procedures   [place] LIMITED JOINT SPACE STRUCTURES LOW  RIGHT(NO LINKED CHARGES)    Order Specific Question:   Reason for Exam (SYMPTOM  OR DIAGNOSIS REQUIRED)    Answer:   R knee pain    Order Specific Question:   Preferred imaging location?    Answer:    [place]   DG Knee AP/LAT W/[place] Right    Standing Status:   Future    Number of Occurrences:   1    Standing Expiration Date:   08/24/2021    Order Specific Question:   Reason for Exam (SYMPTOM  OR DIAGNOSIS REQUIRED)    Answer:   R knee pain    Order Specific Question:   Is patient pregnant?    Answer:   No    Order Specific Question:   Preferred imaging location?    Answer:   [facility]   DG Shoulder Left    Standing Status:   Future    Number of Occurrences:   1    Standing Expiration Date:   08/24/2021    Order Specific Question:   Reason for Exam (SYMPTOM  OR DIAGNOSIS REQUIRED)    Answer:   L shoulder pain    Order Specific Question:   Is patient pregnant?    Answer:   No    Order Specific Question:   Preferred imaging location?    Answer:   [facility]   No orders of the defined types were placed in this encounter.    Discussed warning signs or symptoms. Please see discharge instructions. Patient expresses understanding.   The above documentation has been reviewed and is accurate and complete Lynne Leader, M.D.

## 2021-07-24 NOTE — Patient Instructions (Addendum)
Good to see you today.  Please perform the exercise program that we have prepared for you and gone over in detail on a daily basis.  In addition to the handout you were provided you can access your program through: [email or web address]   Your unique program code is:   [email or web address] and : [email or web address]  Please get an Xray today before you leave.  Follow-up: 6 weeks

## 2021-07-25 NOTE — Progress Notes (Signed)
Left shoulder x-ray looks normal to radiology

## 2021-07-25 NOTE — Progress Notes (Signed)
Right knee x-ray looks normal to radiology

## 2021-08-14 ENCOUNTER — Encounter: Payer: Self-pay | Admitting: Family Medicine

## 2021-08-16 ENCOUNTER — Other Ambulatory Visit: Payer: Self-pay

## 2021-08-16 ENCOUNTER — Encounter: Payer: Self-pay | Admitting: Gastroenterology

## 2021-08-16 ENCOUNTER — Ambulatory Visit: Payer: Self-pay

## 2021-08-16 ENCOUNTER — Ambulatory Visit: Payer: 59 | Admitting: Family Medicine

## 2021-08-16 VITALS — BP 100/70 | HR 97 | Ht 64.0 in | Wt 127.8 lb

## 2021-08-16 DIAGNOSIS — G8929 Other chronic pain: Secondary | ICD-10-CM

## 2021-08-16 DIAGNOSIS — M25512 Pain in left shoulder: Secondary | ICD-10-CM

## 2021-08-16 DIAGNOSIS — M25511 Pain in right shoulder: Secondary | ICD-10-CM | POA: Diagnosis not present

## 2021-08-16 NOTE — Patient Instructions (Addendum)
Good to see you.  You had B shoulder injections.  Call or go to the ER if you develop a large red swollen joint with extreme pain or oozing puss.   I've referred you to [facility].  Their office will call you to schedule but please let us know if you don't hear from them in one week regarding scheduling.  Follow-up:as needed

## 2021-08-16 NOTE — Progress Notes (Signed)
I, Philbert Riser, LAT, ATC acting as a scribe for Clementeen Graham, MD.  Michele Turner is a 46 y.o. female who presents to [facility] at [facility] today for bilat shoulder pain. Pt was previously seen by Dr. Denyse Amass on 07/24/21 for R PFPS and L shoulder pain due to subacromial bursitis and impingement and was taught HEP.  Today, pt reports B ant shoulder pain w/ some radiating pain into her R post shoulder.  She reports popping in her L shoulder and can reproduce this popping when interlocking her hands behind her back.   Dx imaging: 07/24/21 L shoulder XR  05/20/19 R shoulder XR  12/28/14 C-spine XR  Pertinent review of systems: No fevers or chills  Relevant historical information: History of DVT   Exam:  BP 100/70 (BP Location: Right Arm, Patient Position: Sitting, Cuff Size: Normal)    Pulse 97    Ht 5\' 4"  (1.626 m)    Wt 127 lb 12.8 oz (58 kg)    SpO2 98%    BMI 21.94 kg/m  General: Well Developed, well nourished, and in no acute distress.   MSK: Right shoulder Well-developed musculature. Normal shoulder motion. Pain with overhead motion. Positive Hawkins and Neer's test. Intact strength.  Left shoulder: Well-developed musculature. Normal shoulder motion. Family range of motion. Positive Hawkins and Neer's test. Intact strength.    Lab and Radiology Results  Procedure: Real-time Ultrasound Guided Injection of right shoulder subacromial bursa Device: Philips Affiniti 50G Images permanently stored and available for review in PACS Verbal informed consent obtained.  Discussed risks and benefits of procedure. Warned about infection bleeding damage to structures skin hypopigmentation and fat atrophy among others. Patient expresses understanding and agreement Time-out conducted.   Noted no overlying erythema, induration, or other signs of local infection.   Skin prepped in a sterile fashion.   Local anesthesia: Topical Ethyl chloride.   With  sterile technique and under real time ultrasound guidance: 40 mg of Kenalog and 2 mL Marcaine injected into subacromial bursa. Fluid seen entering the bursa.   Completed without difficulty   Pain immediately resolved suggesting accurate placement of the medication.   Advised to call if fevers/chills, erythema, induration, drainage, or persistent bleeding.   Images permanently stored and available for review in the ultrasound unit.  Impression: Technically successful ultrasound guided injection.    Procedure: Real-time Ultrasound Guided Injection of left shoulder subacromial bursa Device: Philips Affiniti 50G Images permanently stored and available for review in PACS Verbal informed consent obtained.  Discussed risks and benefits of procedure. Warned about infection bleeding damage to structures skin hypopigmentation and fat atrophy among others. Patient expresses understanding and agreement Time-out conducted.   Noted no overlying erythema, induration, or other signs of local infection.   Skin prepped in a sterile fashion.   Local anesthesia: Topical Ethyl chloride.   With sterile technique and under real time ultrasound guidance: 40 mg of Kenalog and 2 L of Marcaine injected into subacromial bursa. Fluid seen entering the bursa.   Completed without difficulty   Pain immediately resolved suggesting accurate placement of the medication.   Advised to call if fevers/chills, erythema, induration, drainage, or persistent bleeding.   Images permanently stored and available for review in the ultrasound unit.  Impression: Technically successful ultrasound guided injection.   EXAM: LEFT SHOULDER - 2+ VIEW   COMPARISON:  None.   FINDINGS: There is no evidence of fracture or dislocation. There is no evidence of arthropathy or other focal  bone abnormality. Soft tissues are unremarkable.   IMPRESSION: Negative.     Electronically Signed   By: Elgie Collard M.D.   On: 07/25/2021  00:50 I, Clementeen Graham, personally (independently) visualized and performed the interpretation of the images attached in this note.    Assessment and Plan: 46 y.o. female with bilateral shoulder pain thought to be due to subacromial bursitis.  Plan for subacromial injection today and referral to physical therapy.  She is an excellent candidate for a little bit of physical therapy for teaching and guidance especially with her weight lifting activity.  [facility] is a great choice because they exist in a gym where weight lifting equipment is available.  Recheck back with me as needed.   PDMP not reviewed this encounter. Orders Placed This Encounter  Procedures   [place] LIMITED JOINT SPACE STRUCTURES UP BILAT(NO LINKED CHARGES)    Standing Status:   Future    Number of Occurrences:   1    Standing Expiration Date:   02/13/2022    Order Specific Question:   Reason for Exam (SYMPTOM  OR DIAGNOSIS REQUIRED)    Answer:   bilateral shoulder pain    Order Specific Question:   Preferred imaging location?    Answer:   [facility] referral to Physical Therapy    Referral Priority:   Routine    Referral Type:   Physical Medicine    Referral Reason:   Specialty Services Required    Requested Specialty:   Physical Therapy    Number of Visits Requested:   1   No orders of the defined types were placed in this encounter.    Discussed warning signs or symptoms. Please see discharge instructions. Patient expresses understanding.   The above documentation has been reviewed and is accurate and complete Clementeen Graham, M.D.

## 2021-08-17 ENCOUNTER — Other Ambulatory Visit: Payer: Self-pay

## 2021-08-17 DIAGNOSIS — K209 Esophagitis, unspecified without bleeding: Secondary | ICD-10-CM

## 2021-08-17 DIAGNOSIS — R1013 Epigastric pain: Secondary | ICD-10-CM

## 2021-08-17 DIAGNOSIS — K219 Gastro-esophageal reflux disease without esophagitis: Secondary | ICD-10-CM

## 2021-08-17 MED ORDER — PANTOPRAZOLE SODIUM 40 MG PO TBEC: 40.0000 mg | DELAYED_RELEASE_TABLET | Freq: Two times a day (BID) | ORAL | 11 refills | Status: AC

## 2021-09-04 ENCOUNTER — Other Ambulatory Visit: Payer: Self-pay

## 2021-09-04 ENCOUNTER — Ambulatory Visit: Payer: 59 | Admitting: Family Medicine

## 2021-09-04 VITALS — BP 124/78 | HR 91 | Ht 64.0 in | Wt 125.6 lb

## 2021-09-04 DIAGNOSIS — M25512 Pain in left shoulder: Secondary | ICD-10-CM

## 2021-09-04 DIAGNOSIS — M25561 Pain in right knee: Secondary | ICD-10-CM | POA: Diagnosis not present

## 2021-09-04 DIAGNOSIS — G8929 Other chronic pain: Secondary | ICD-10-CM

## 2021-09-04 DIAGNOSIS — M25511 Pain in right shoulder: Secondary | ICD-10-CM

## 2021-09-04 MED ORDER — TIZANIDINE HCL 2 MG PO CAPS: 2.0000 mg | ORAL_CAPSULE | Freq: Three times a day (TID) | ORAL | 1 refills | Status: AC | PRN

## 2021-09-04 NOTE — Progress Notes (Signed)
I, Philbert Riser, LAT, ATC acting as a scribe for Clementeen Graham, MD.  Michele Turner is a 46 y.o. female who presents to [facility] at [facility] today for f/u chronic R knee pain. Pt was last seen by Dr. Denyse Amass on 08/16/21 for bilat shoulder pain and on 07/24/21 for her R knee pain. Pt was taught HEP for her R knee and advised to modify weightlifting to avoid knee flexion beyond 90 degrees and pt was referred to [facility]. Today, pt reports R knee is feeling OK. Pt has modified her activity to minimize the pain.   Pt reports has been diligent about working on HEP for her shoulder. Pt feels the prior steroid injection helped to calm down the inflammation. Pt would like a refill of the tizanidine as she is out of the rx.  She was referred to [facility] rehab.  She notes that she and her physical therapist are not perfectly see an eye to eye and she has seen a physical therapist previously at [facility] and wonders if she can go there maybe.  Dx imaging: 07/24/21 L shoulder & R knee XR             05/20/19 R shoulder XR             12/28/14 C-spine XR  Pertinent review of systems: no fever or chills  Relevant historical information: History of DVT   Exam:  BP 124/78    Pulse 91    Ht 5\' 4"  (1.626 m)    Wt 125 lb 9.6 oz (57 kg)    SpO2 100%    BMI 21.56 kg/m  General: Well Developed, well nourished, and in no acute distress.   MSK: Normal shoulder and knee motion.    Lab and Radiology Results EXAM: LEFT SHOULDER - 2+ VIEW   COMPARISON:  None.   FINDINGS: There is no evidence of fracture or dislocation. There is no evidence of arthropathy or other focal bone abnormality. Soft tissues are unremarkable.   IMPRESSION: Negative.     Electronically Signed   By: M.D.   On: 07/25/2021 00:50    EXAM: RIGHT KNEE 3 VIEWS   COMPARISON:  None.   FINDINGS: No evidence of fracture, dislocation, or joint effusion. No evidence of  arthropathy or other focal bone abnormality. Soft tissues are unremarkable.   IMPRESSION: Negative.     Electronically Signed   By: 07/27/2021 M.D.   On: 07/24/2021 21:56  EXAM: RIGHT SHOULDER - 2+ VIEW   COMPARISON:  None.   FINDINGS: No fracture or dislocation of the right shoulder. Joint spaces are well preserved. The partially imaged right chest is unremarkable.   IMPRESSION: No fracture or dislocation of the right shoulder. Joint spaces are well preserved.     Electronically Signed   By: 07/26/2021 M.D.   On: 05/20/2019 15:22 I, 13/06/2019, personally (independently) visualized and performed the interpretation of the images attached in this note.   Assessment and Plan: 46 y.o. female with bilateral shoulder pain and right knee pain.  Improved slightly with PT.  I think she will need continued ongoing PT for a little bit longer.  However there seems to be a bit of personality difference between [place] and her physical therapist.  I gave her some strategies to help open the communication up with her physical therapist but I also placed a referral to the [facility] [place] location where she knows a physical  therapist there that she already has a relationship with.  That may be a better option going forward.  Check back with me as needed.  Tizanidine refilled.   PDMP not reviewed this encounter. Orders Placed This Encounter  Procedures   Ambulatory referral to Physical Therapy    Referral Priority:   Routine    Referral Type:   Physical Medicine    Referral Reason:   Specialty Services Required    Requested Specialty:   Physical Therapy    Number of Visits Requested:   1   Meds ordered this encounter  Medications   tizanidine (ZANAFLEX) 2 MG capsule    Sig: Take 1-2 capsules (2-4 mg total) by mouth 3 (three) times daily as needed for muscle spasms.    Dispense:  60 capsule    Refill:  1     Discussed warning signs or symptoms. Please see discharge  instructions. Patient expresses understanding.   The above documentation has been reviewed and is accurate and complete Clementeen Graham, M.D.

## 2021-09-04 NOTE — Patient Instructions (Addendum)
Thank you for coming in today.   I've referred you to Physical Therapy.  Let us know if you don't hear from them in one week.   Recheck back as needed 

## 2021-09-14 ENCOUNTER — Other Ambulatory Visit: Payer: Self-pay | Admitting: Obstetrics & Gynecology

## 2021-09-14 ENCOUNTER — Other Ambulatory Visit: Payer: Self-pay | Admitting: Gastroenterology

## 2021-09-14 DIAGNOSIS — N6489 Other specified disorders of breast: Secondary | ICD-10-CM

## 2021-09-25 IMAGING — DX DG SHOULDER 2+V*R*
3 series · 3 of 3 positions shown · non-contrast
Comparison: None.

CLINICAL DATA: Torn right rotator cuff four weeks ago, persistent
pain

EXAM:
RIGHT SHOULDER - 2+ VIEW

[grashey]
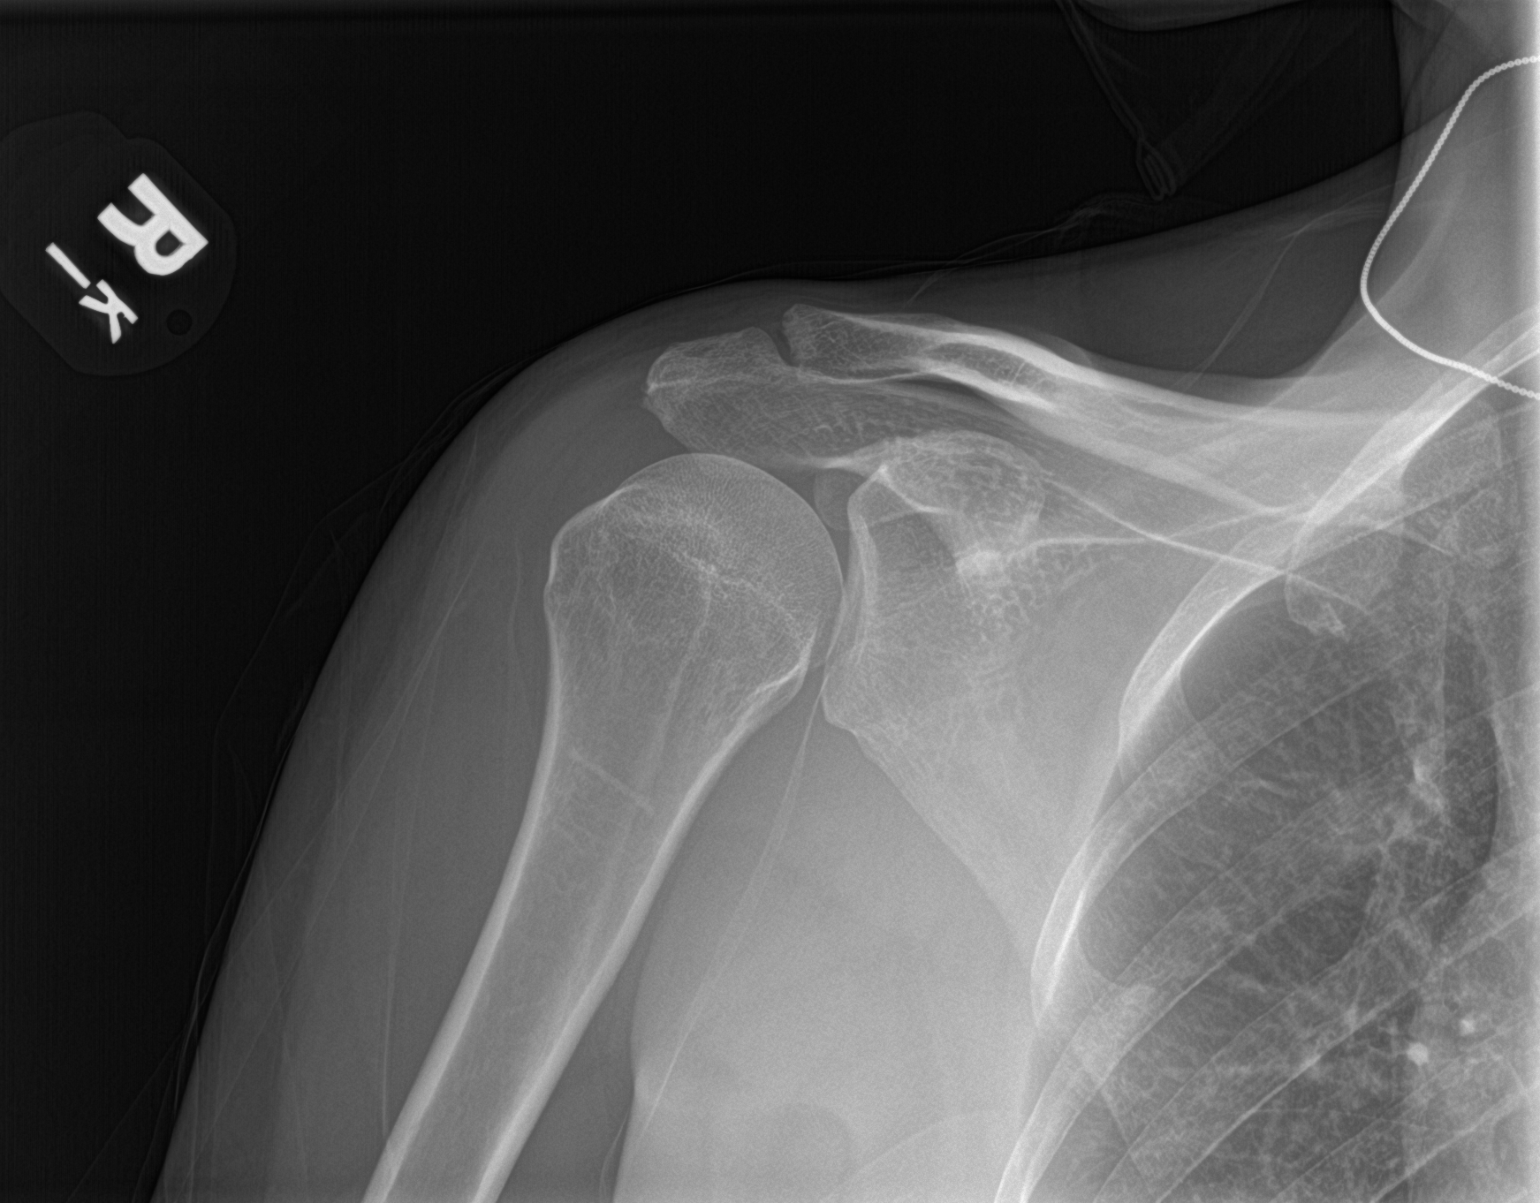

[y view]
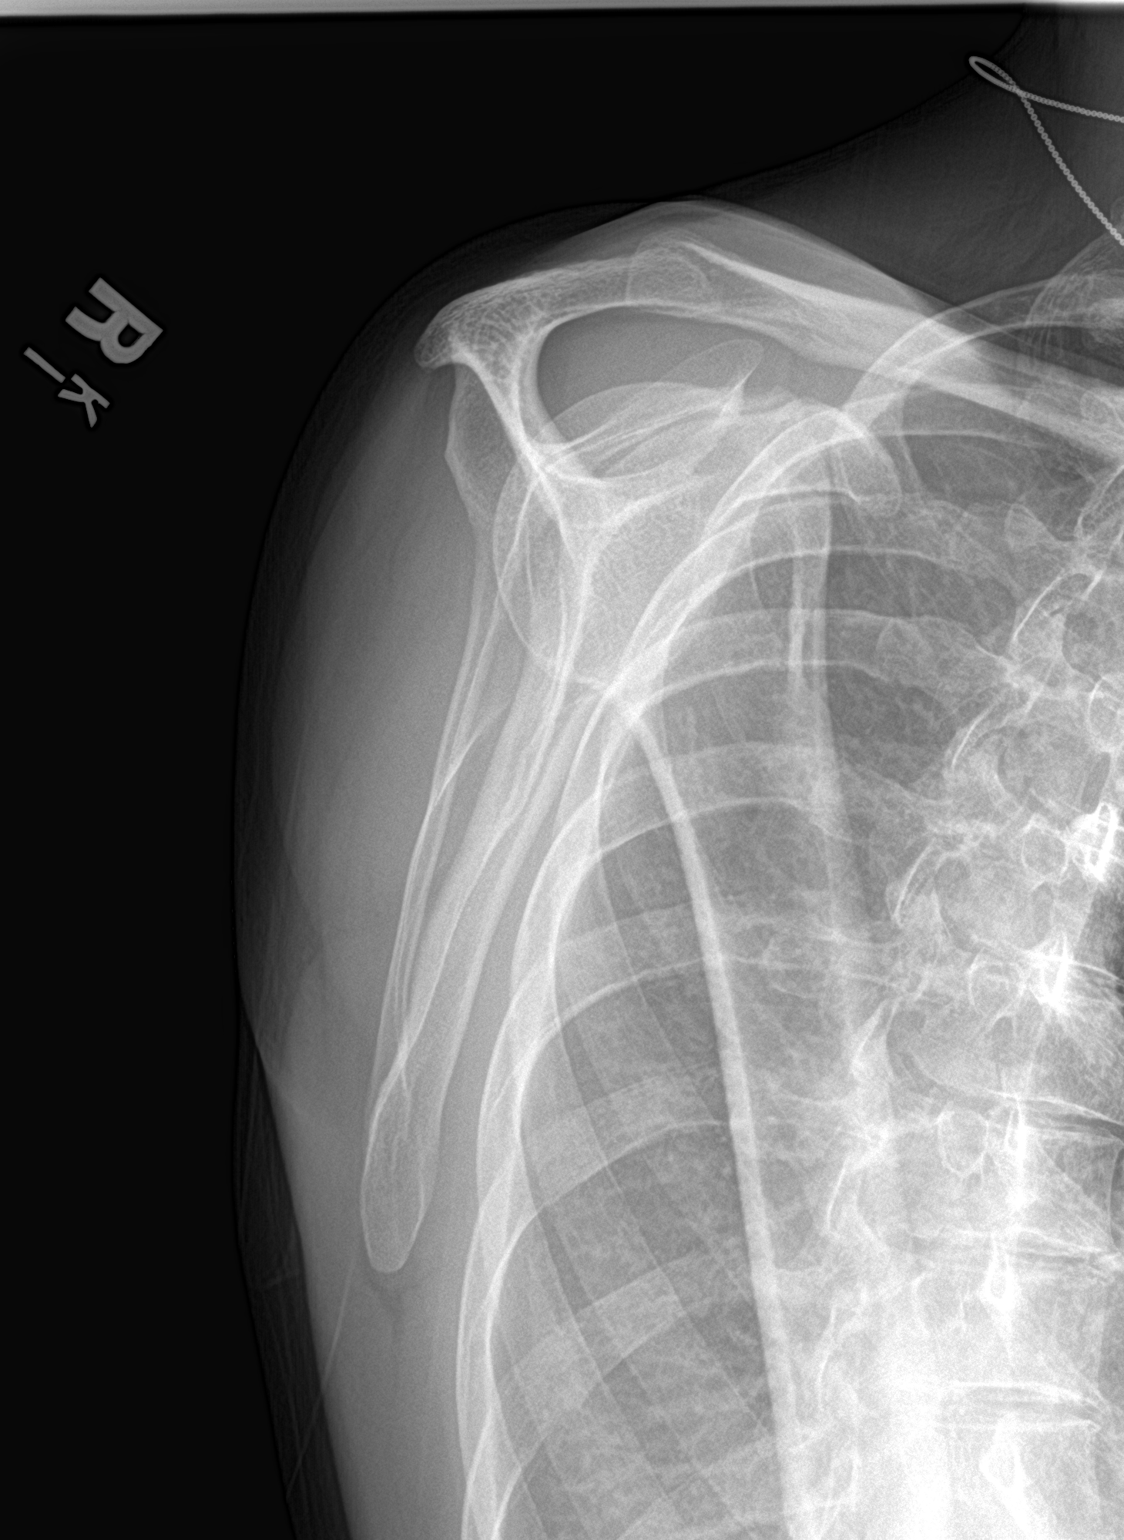

[shoulder axial]
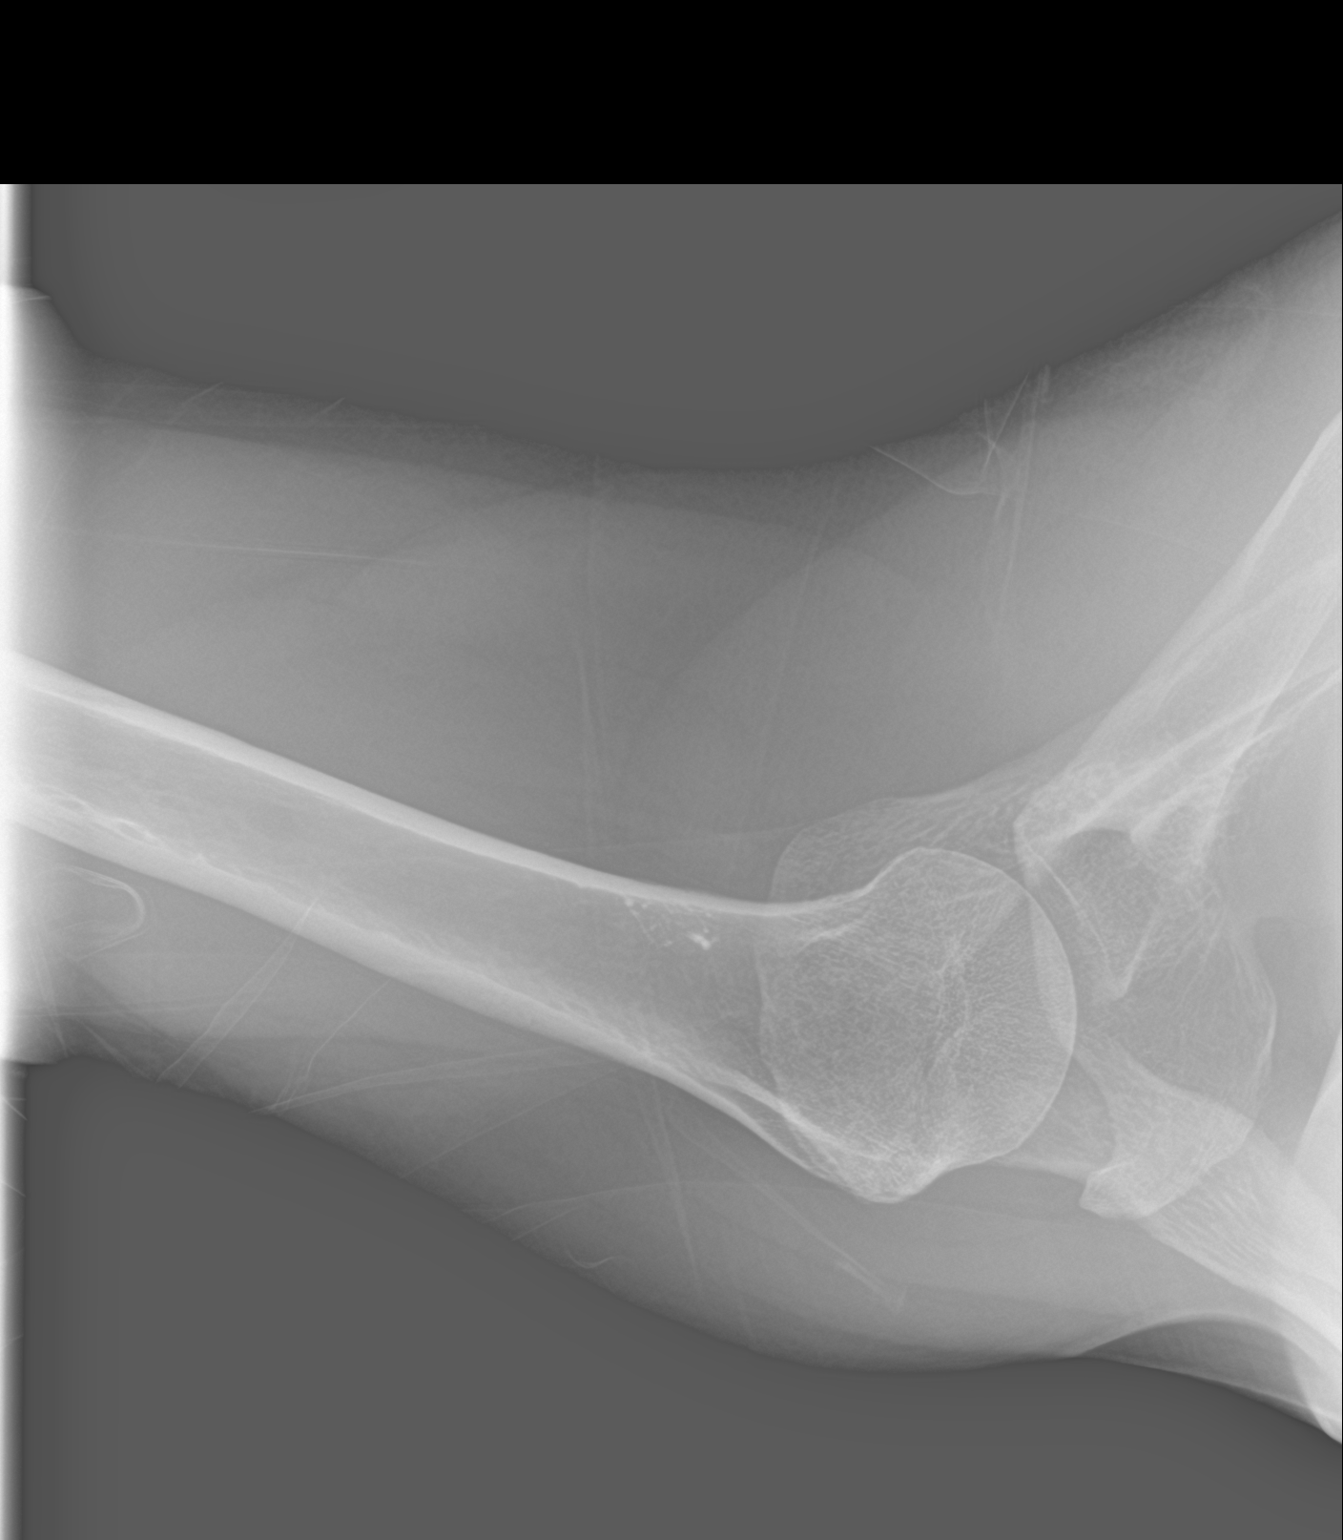

[3 of 3 positions shown; findings below may reference images not displayed]

FINDINGS: No fracture or dislocation of the right shoulder. Joint spaces are
well preserved. The partially imaged right chest is unremarkable.
IMPRESSION: No fracture or dislocation of the right shoulder. Joint spaces are
well preserved.

## 2021-09-26 ENCOUNTER — Other Ambulatory Visit: Payer: Self-pay

## 2021-09-26 ENCOUNTER — Ambulatory Visit (HOSPITAL_BASED_OUTPATIENT_CLINIC_OR_DEPARTMENT_OTHER): Payer: 59 | Attending: Family Medicine | Admitting: Physical Therapy

## 2021-09-26 ENCOUNTER — Encounter (HOSPITAL_BASED_OUTPATIENT_CLINIC_OR_DEPARTMENT_OTHER): Payer: Self-pay | Admitting: Physical Therapy

## 2021-09-26 DIAGNOSIS — G8929 Other chronic pain: Secondary | ICD-10-CM | POA: Diagnosis present

## 2021-09-26 DIAGNOSIS — M25512 Pain in left shoulder: Secondary | ICD-10-CM | POA: Insufficient documentation

## 2021-09-26 DIAGNOSIS — M25561 Pain in right knee: Secondary | ICD-10-CM | POA: Insufficient documentation

## 2021-09-26 DIAGNOSIS — M25511 Pain in right shoulder: Secondary | ICD-10-CM | POA: Insufficient documentation
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
## 2021-09-27 ENCOUNTER — Encounter (HOSPITAL_BASED_OUTPATIENT_CLINIC_OR_DEPARTMENT_OTHER): Payer: Self-pay | Admitting: Physical Therapy

## 2021-10-10 ENCOUNTER — Encounter (HOSPITAL_BASED_OUTPATIENT_CLINIC_OR_DEPARTMENT_OTHER): Payer: Self-pay | Admitting: Physical Therapy

## 2021-10-10 ENCOUNTER — Ambulatory Visit (HOSPITAL_BASED_OUTPATIENT_CLINIC_OR_DEPARTMENT_OTHER): Payer: 59 | Attending: Family Medicine | Admitting: Physical Therapy

## 2021-10-10 DIAGNOSIS — M25561 Pain in right knee: Secondary | ICD-10-CM | POA: Diagnosis present

## 2021-10-10 DIAGNOSIS — M25511 Pain in right shoulder: Secondary | ICD-10-CM | POA: Insufficient documentation

## 2021-10-10 DIAGNOSIS — M25512 Pain in left shoulder: Secondary | ICD-10-CM | POA: Insufficient documentation

## 2021-10-10 DIAGNOSIS — G8929 Other chronic pain: Secondary | ICD-10-CM | POA: Insufficient documentation
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
## 2021-10-11 ENCOUNTER — Encounter (HOSPITAL_BASED_OUTPATIENT_CLINIC_OR_DEPARTMENT_OTHER): Payer: Self-pay | Admitting: Physical Therapy

## 2021-10-16 ENCOUNTER — Encounter (HOSPITAL_BASED_OUTPATIENT_CLINIC_OR_DEPARTMENT_OTHER): Payer: 59 | Admitting: Physical Therapy

## 2021-10-18 ENCOUNTER — Ambulatory Visit
Admission: RE | Admit: 2021-10-18 | Discharge: 2021-10-18 | Disposition: A | Discharge status: Home / Self Care | Payer: 59 | Source: Ambulatory Visit | Attending: Obstetrics & Gynecology | Admitting: Obstetrics & Gynecology

## 2021-10-18 ENCOUNTER — Ambulatory Visit: Payer: 59

## 2021-10-18 DIAGNOSIS — N6489 Other specified disorders of breast: Secondary | ICD-10-CM

## 2021-10-22 ENCOUNTER — Encounter (HOSPITAL_BASED_OUTPATIENT_CLINIC_OR_DEPARTMENT_OTHER): Payer: Self-pay | Admitting: Obstetrics & Gynecology

## 2021-10-22 ENCOUNTER — Ambulatory Visit (INDEPENDENT_AMBULATORY_CARE_PROVIDER_SITE_OTHER): Payer: 59 | Admitting: Obstetrics & Gynecology

## 2021-10-22 VITALS — BP 121/75 | HR 75 | Ht 64.0 in | Wt 127.4 lb

## 2021-10-22 DIAGNOSIS — Z975 Presence of (intrauterine) contraceptive device: Secondary | ICD-10-CM | POA: Insufficient documentation

## 2021-10-22 DIAGNOSIS — Z9882 Breast implant status: Secondary | ICD-10-CM | POA: Insufficient documentation

## 2021-10-22 DIAGNOSIS — Z01419 Encounter for gynecological examination (general) (routine) without abnormal findings: Secondary | ICD-10-CM | POA: Diagnosis not present

## 2021-10-22 DIAGNOSIS — I82402 Acute embolism and thrombosis of unspecified deep veins of left lower extremity: Secondary | ICD-10-CM

## 2021-10-22 DIAGNOSIS — Z86718 Personal history of other venous thrombosis and embolism: Secondary | ICD-10-CM | POA: Diagnosis not present
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
## 2021-10-23 ENCOUNTER — Encounter (HOSPITAL_BASED_OUTPATIENT_CLINIC_OR_DEPARTMENT_OTHER): Payer: Self-pay | Admitting: Physical Therapy

## 2021-10-23 ENCOUNTER — Ambulatory Visit (HOSPITAL_BASED_OUTPATIENT_CLINIC_OR_DEPARTMENT_OTHER): Payer: 59 | Admitting: Physical Therapy

## 2021-10-23 DIAGNOSIS — G8929 Other chronic pain: Secondary | ICD-10-CM

## 2021-10-23 DIAGNOSIS — M25561 Pain in right knee: Secondary | ICD-10-CM | POA: Diagnosis not present
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
## 2021-11-01 ENCOUNTER — Encounter (HOSPITAL_BASED_OUTPATIENT_CLINIC_OR_DEPARTMENT_OTHER): Payer: Self-pay

## 2021-11-01 ENCOUNTER — Emergency Department (HOSPITAL_BASED_OUTPATIENT_CLINIC_OR_DEPARTMENT_OTHER): Payer: 59

## 2021-11-01 ENCOUNTER — Other Ambulatory Visit: Payer: Self-pay

## 2021-11-01 ENCOUNTER — Emergency Department (HOSPITAL_BASED_OUTPATIENT_CLINIC_OR_DEPARTMENT_OTHER)
Admission: EM | Admit: 2021-11-01 | Discharge: 2021-11-01 | Disposition: A | Discharge status: Home / Self Care | Payer: 59 | Attending: Emergency Medicine | Admitting: Emergency Medicine

## 2021-11-01 DIAGNOSIS — R519 Headache, unspecified: Secondary | ICD-10-CM | POA: Diagnosis not present

## 2021-11-01 DIAGNOSIS — R002 Palpitations: Secondary | ICD-10-CM | POA: Insufficient documentation

## 2021-11-01 DIAGNOSIS — Z7982 Long term (current) use of aspirin: Secondary | ICD-10-CM | POA: Diagnosis not present

## 2021-11-01 DIAGNOSIS — E860 Dehydration: Secondary | ICD-10-CM | POA: Diagnosis not present

## 2021-11-01 DIAGNOSIS — J45909 Unspecified asthma, uncomplicated: Secondary | ICD-10-CM | POA: Insufficient documentation

## 2021-11-01 DIAGNOSIS — E039 Hypothyroidism, unspecified: Secondary | ICD-10-CM | POA: Diagnosis not present

## 2021-11-01 LAB — CBC
HCT: 38.1 % (ref 36.0–46.0)
Hemoglobin: 13 g/dL (ref 12.0–15.0)
MCH: 30.7 pg (ref 26.0–34.0)
MCHC: 34.1 g/dL (ref 30.0–36.0)
MCV: 90.1 fL (ref 80.0–100.0)
Platelets: 311 10*3/uL (ref 150–400)
RBC: 4.23 MIL/uL (ref 3.87–5.11)
RDW: 12.3 % (ref 11.5–15.5)
WBC: 6.7 10*3/uL (ref 4.0–10.5)
nRBC: 0 % (ref 0.0–0.2)

## 2021-11-01 LAB — BASIC METABOLIC PANEL
Anion gap: 6 (ref 5–15)
BUN: 7 mg/dL (ref 6–20)
CO2: 26 mmol/L (ref 22–32)
Calcium: 8.9 mg/dL (ref 8.9–10.3)
Chloride: 106 mmol/L (ref 98–111)
Creatinine, Ser: 0.63 mg/dL (ref 0.44–1.00)
GFR, Estimated: >60 mL/min (ref >60)
Glucose, Bld: 95 mg/dL (ref 70–99)
Potassium: 3.7 mmol/L (ref 3.5–5.1)
Sodium: 138 mmol/L (ref 135–145)

## 2021-11-01 LAB — TROPONIN I (HIGH SENSITIVITY): Troponin I (High Sensitivity): <2 ng/L (ref <18)

## 2021-11-01 LAB — PREGNANCY, URINE: Preg Test, Ur: NEGATIVE

## 2021-11-01 MED ORDER — KETOROLAC TROMETHAMINE 15 MG/ML IJ SOLN
15.0000 mg | Freq: Once | INTRAMUSCULAR | Status: AC
  Administered 2021-11-01: 15 mg via INTRAVENOUS
  Filled 2021-11-01: qty 1

## 2021-11-01 MED ORDER — PROCHLORPERAZINE EDISYLATE 10 MG/2ML IJ SOLN
10.0000 mg | Freq: Once | INTRAMUSCULAR | Status: AC
  Administered 2021-11-01: 10 mg via INTRAVENOUS
  Filled 2021-11-01: qty 2

## 2021-11-01 MED ORDER — LACTATED RINGERS IV BOLUS
1000.0000 mL | Freq: Once | INTRAVENOUS | Status: AC
  Administered 2021-11-01: 1000 mL via INTRAVENOUS

## 2021-11-01 NOTE — ED Triage Notes (Signed)
Pt also c/o room spinning dizziness that is made worse by sudden head movements and palliated by closing eyes.  ?

## 2021-11-01 NOTE — ED Notes (Signed)
Pt. Reports feeling palpitations and no nausea with dizziness with a headache.  Pt. Has had this for 2 days now.  Pt. Gave more in depth explanation to PA C of her history. ?

## 2021-11-01 NOTE — Discharge Instructions (Signed)
Your work-up and exam today was reassuring did not show any concerning etiology of your palpitations or headache.  Your EKG and your heart monitor while in the emergency room did not show any rhythms that were concerning.  I do recommend that you keep your follow-up appointment with your cardiologist tomorrow.  Your headache improved following medications and hydration.  If you have any concerning symptoms such as chest pain, shortness of breath, worsening palpitations, worsening headache associated with weakness please return to the emergency room for evaluation.  Otherwise he can follow-up with PCP as needed. ?

## 2021-11-01 NOTE — ED Notes (Signed)
ED Provider at bedside. 
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
## 2021-11-01 NOTE — ED Provider Notes (Signed)
?[facility] EMERGENCY DEPARTMENT ?Provider Note ? ? ?CSN: [id number] ?Arrival date & time: 11/01/21  1652 ? ?  ? ?History ? ?No chief complaint on file. ? ? ?Michele Turner is a 46 y.o. female. ? ?46 year old female with past medical history of DVT presents today for evaluation of palpitations, and headache of about 2-day duration.  Patient states this all started Tuesday evening after she drove to [place] to hang out with friends and for work meeting.  She states Tuesday evening she had significant amount to drink.  Wednesday she did not feel well and had palpitations.  She states Wednesday evening she called out of mobile IV unit who gave her 1 bag of fluids.  She states today she felt better with less frequent palpitations however she still did not feel well and drove back into town.  She states after driving back and she now has a headache which she rates as a 5 out of 10 on the left side of the head.  Denies previous history of migraines.  She has not taken anything for this prior to arrival.  Denies weakness, difficulty speaking, or other strokelike symptoms. ? ?The history is provided by the patient. No language interpreter was used.  ? ?  ? ?Home Medications ?Prior to Admission medications   ?Medication Sig Start Date End Date Taking? Authorizing Provider  ?ALPRAZolam (XANAX) 0.25 MG tablet Take 1 tablet (0.25 mg total) by mouth at bedtime as needed for anxiety. 04/28/19   Overton Mamirigliano, Mary K, DO  ?aspirin 81 MG chewable tablet Chew 81 mg by mouth daily.     [provider]  ?b complex vitamins capsule Take 1 capsule by mouth daily. methaolated b complex    [provider]  ?cetirizine (ZYRTEC) 10 MG tablet Take 10 mg by mouth daily.    [provider]  ?Cholecalciferol 125 MCG (5000 UT) TABS Take 1 tablet by mouth daily.    [provider]  ?dicyclomine (BENTYL) 20 MG tablet Take 1 tablet (20 mg total) by mouth 4 (four) times daily -  before meals  and at bedtime. 08/04/18   Tressia DanasBeavers, Kimberly, MD  ?famotidine (PEPCID) 20 MG tablet Take 20 mg by mouth daily as needed for heartburn or indigestion.    [provider]  ?hydrocortisone 2.5 % ointment as needed.  01/14/19   [provider]  ?levonorgestrel (MIRENA) 20 MCG/24HR IUD 1 each by Intrauterine route once. September 07, 2019    [provider]  ?lisdexamfetamine (VYVANSE) 70 MG capsule Take 1 capsule (70 mg total) by mouth daily. 05/14/19   Overton Mamirigliano, Mary K, DO  ?OZEMPIC, 1 MG/DOSE, 4 MG/3ML SOPN Inject 1 mg into the skin once a week. 04/28/21   [provider]  ?pantoprazole (PROTONIX) 40 MG tablet Take 1 tablet (40 mg total) by mouth 2 (two) times daily. 08/17/21   Tressia DanasBeavers, Kimberly, MD  ?sucralfate (CARAFATE) 1 g tablet Take 1 tablet (1 g total) by mouth 4 (four) times daily -  with meals and at bedtime. Dissolve 1 tablet in a small amount of water and take qid 06/18/21   Tressia DanasBeavers, Kimberly, MD  ?tizanidine (ZANAFLEX) 2 MG capsule Take 1-2 capsules (2-4 mg total) by mouth 3 (three) times daily as needed for muscle spasms. 09/04/21   Rodolph Bongorey, Evan S, MD  ?valACYclovir (VALTREX) 1000 MG tablet Take 2 tablets at onset of cold sore, and repeat in 12 hours (max 4 pills per cold sore) 01/03/17   [provider]  ?  Vilazodone HCl (VIIBRYD) 10 MG TABS Take 1 tablet (10 mg total) by mouth daily. 11/18/18   Overton Mam, DO  ?   ? ?Allergies    ?Patient has no known allergies.   ? ?Review of Systems   ?Review of Systems  ?Constitutional:  Negative for chills and fever.  ?Eyes:  Negative for visual disturbance.  ?Respiratory:  Negative for shortness of breath.   ?Cardiovascular:  Positive for palpitations. Negative for chest pain and leg swelling.  ?Gastrointestinal:  Negative for abdominal pain, nausea and vomiting.  ?Neurological:  Positive for light-headedness and headaches. Negative for syncope.  ?All other systems reviewed and are negative. ? ?Physical Exam ?Updated Vital  Signs ?BP (!) 123/91 (BP Location: Left Leg)   Pulse 65   Temp 98.4 ?F (36.9 ?C) (Oral)   Resp 20   Ht 5\' 4"  (1.626 m)   Wt 56.7 kg   SpO2 100%   BMI 21.46 kg/m?  ?Physical Exam ?Vitals and nursing note reviewed.  ?Constitutional:   ?   General: She is not in acute distress. ?   Appearance: Normal appearance. She is not ill-appearing.  ?HENT:  ?   Head: Normocephalic and atraumatic.  ?   Nose: Nose normal.  ?Eyes:  ?   General: No scleral icterus. ?   Extraocular Movements: Extraocular movements intact.  ?   Conjunctiva/sclera: Conjunctivae normal.  ?Cardiovascular:  ?   Rate and Rhythm: Normal rate and regular rhythm.  ?   Pulses: Normal pulses.  ?   Heart sounds: Normal heart sounds.  ?Pulmonary:  ?   Effort: Pulmonary effort is normal. No respiratory distress.  ?   Breath sounds: Normal breath sounds. No wheezing or rales.  ?Abdominal:  ?   General: There is no distension.  ?   Palpations: Abdomen is soft.  ?   Tenderness: There is no abdominal tenderness.  ?Musculoskeletal:     ?   General: Normal range of motion.  ?   Cervical back: Normal range of motion.  ?   Right lower leg: No edema.  ?   Left lower leg: No edema.  ?Skin: ?   General: Skin is warm and dry.  ?Neurological:  ?   General: No focal deficit present.  ?   Mental Status: She is alert. Mental status is at baseline.  ? ? ?ED Results / Procedures / Treatments   ?Labs ?(all labs ordered are listed, but only abnormal results are displayed) ?Labs Reviewed  ?BASIC METABOLIC PANEL  ?CBC  ?PREGNANCY, URINE  ?TROPONIN I (HIGH SENSITIVITY)  ? ? ?EKG ?EKG Interpretation ? ?Date/Time:  Thursday November 01 2021 17:00:59 EDT ?Ventricular Rate:  62 ?PR Interval:  130 ?QRS Duration: 82 ?QT Interval:  400 ?QTC Calculation: 406 ?R Axis:   57 ?Text Interpretation: Normal sinus rhythm Nonspecific T wave abnormality Abnormal ECG No previous ECGs available Confirmed by 07-21-2004 [phone]) on 11/01/2021 5:16:41 PM ? ?Radiology ?DG Chest 2 View ? ?Result Date:  11/01/2021 ?CLINICAL DATA:  palpitations EXAM: CHEST - 2 VIEW COMPARISON:  None. FINDINGS: The heart size and mediastinal contours are within normal limits. Both lungs are clear. No visible pleural effusions or pneumothorax. No acute osseous abnormality. Mild scoliosis. IMPRESSION: No active cardiopulmonary disease. Electronically Signed   By: 11/03/2021 M.D.   On: 11/01/2021 17:28   ? ?Procedures ?Procedures  ? ? ?Medications Ordered in ED ?Medications  ?lactated ringers bolus 1,000 mL (has no administration in time range)  ?ketorolac (TORADOL)  15 MG/ML injection 15 mg (has no administration in time range)  ?prochlorperazine (COMPAZINE) injection 10 mg (has no administration in time range)  ? ? ?ED Course/ Medical Decision Making/ A&P ?  ?                        ?Medical Decision Making ?Amount and/or Complexity of Data Reviewed ?Labs: ordered. ?Radiology: ordered. ? ?Risk ?Prescription drug management. ? ? ?Medical Decision Making / ED Course ? ? ?This patient presents to the ED for concern of palpitations, headache, this involves an extensive number of treatment options, and is a complaint that carries with it a high risk of complications and morbidity.  The differential diagnosis includes ACS, A-fib, a flutter, PVCs, CVA, ICH ? ?MDM: ?46 year old female with past medical history of DVT and palpitations presents today for evaluation of palpitations and headache.  Patient previously was evaluated by cardiology and found to have PACs after wearing Zio patch.  No evidence of A-fib or atrial flutter on review of cardiology note.  Patient currently denies chest pain, shortness of breath.  Does report headache that is rated as 5/10.  Neurological exam is nonfocal and reassuring.  This started Tuesday evening after a night out with significant amount of drinking.  She does report improvement in palpitations and low frequency.  Headache is new as of today.  Denies history of migraines.  Will evaluate with CBC, BMP,  troponin, EKG, chest x-ray.  Will provide IV fluids, Toradol and Compazine for the headache.  Will place on cardiac monitor.  EKG shows normal sinus rhythm without arrhythmia.  Without acute ischemic changes either. ?

## 2021-11-01 NOTE — ED Triage Notes (Signed)
Pt presents with a near syncopal episode yesterday. Pt reports since then she has had frequent palpitations that is described as intermittent and last for a couple of seconds each. Pt reports HA. Pt did have a concierge IV hydration last night. Denies ShOB and CP.  ?

## 2021-11-02 ENCOUNTER — Encounter: Payer: Self-pay | Admitting: Adult Health

## 2021-11-02 ENCOUNTER — Other Ambulatory Visit: Payer: Self-pay | Admitting: Adult Health

## 2021-11-02 ENCOUNTER — Ambulatory Visit: Payer: 59 | Admitting: Adult Health

## 2021-11-02 VITALS — BP 110/80 | HR 80 | Ht 64.0 in | Wt 126.4 lb

## 2021-11-02 DIAGNOSIS — R002 Palpitations: Secondary | ICD-10-CM

## 2021-11-02 DIAGNOSIS — F988 Other specified behavioral and emotional disorders with onset usually occurring in childhood and adolescence: Secondary | ICD-10-CM

## 2021-11-02 DIAGNOSIS — R000 Tachycardia, unspecified: Secondary | ICD-10-CM

## 2021-11-02 LAB — TSH: TSH: 0.923 u[IU]/mL (ref 0.450–4.500)

## 2021-11-02 NOTE — Patient Instructions (Addendum)
Medication Instructions:  ?Your Physician recommend you continue on your current medication as directed.   ? ?*If you need a refill on your cardiac medications before your next appointment, please call your pharmacy* ? ? ?Lab Work: ?Today TSH ?If you have labs (blood work) drawn today and your tests are completely normal, you will receive your results only by: ?MyChart Message (if you have MyChart) OR ?A paper copy in the mail ?If you have any lab test that is abnormal or we need to change your treatment, we will call you to review the results. ? ? ?Testing/Procedures: ?Your physician has requested that you have an echocardiogram. Echocardiography is a painless test that uses sound waves to create images of your heart. It provides your doctor with information about the size and shape of your heart and how well your heart?s chambers and valves are working. This procedure takes approximately one hour.  ? ?[employer] Cardiac Event Monitor Instructions ?Your physician has requested you wear your cardiac event monitor for 30 days ?[employer] may call or text to confirm a shipping address. The monitor will be sent to a land address via UPS. ?[employer] will not ship a monitor to a PO BOX. It typically takes 3-5 days to receive your monitor after it has ?been enrolled. [employer] will assist with USPS tracking if your package is delayed. The telephone number for ?[employer] is [phone]. ?Once you have received your monitor, please review the enclosed instructions. Instruction tutorials can also be ?viewed under help and settings on the enclosed cell phone. Your monitor has already been registered assigning ?a specific monitor serial # to you. ? ?Applying the monitor ?Remove cell phone from case and turn it on. The cell phone works as IT consultant and needs to be within ?10 feet of you at all times. The cell phone will need to be charged on a daily basis. We recommend you plug the ?cell phone into the enclosed  charger at your bedside table every night. ? ?Monitor batteries: You will receive two monitor batteries labelled #1 and #2. These are your recorders. Plug ?battery #2 onto the second connection on the enclosed charger. Keep one battery on the charger at all times. ?This will keep the monitor battery deactivated. It will also keep it fully charged for when you need to switch ?your monitor batteries. A small light will be blinking on the battery emblem when it is charging. The light on the ?battery emblem will remain on when the battery is fully charged. ? ?Open package of a Monitor strip. Insert battery #1 into black hood on strip and gently squeeze monitor battery ?onto connection as indicated in instruction booklet. Set aside while preparing skin. ? ?Choose location for your strip, vertical or horizontal, as indicated in the instruction booklet. Shave to remove all ?hair from location. There cannot be any lotions, oils, powders, or colognes on skin where monitor is to be ?applied. Wipe skin clean with enclosed Saline wipe. Dry skin completely. ? ?Peel paper labeled #1 off the back of the Monitor strip exposing the adhesive. Place the monitor on the chest in ?the vertical or horizontal position shown in the instruction booklet. One arrow on the monitor strip must be ?pointing upward. Carefully remove paper labeled #2, attaching remainder of strip to your skin. Try not to ?create any folds or wrinkles in the strip as you apply it. ? ?Firmly press and release the circle in the center of the monitor battery. You will hear a small beep. This  is ?turning the monitor battery on. The heart emblem on the monitor battery will light up every 5 seconds if the ?monitor battery in turned on and connected to the patient securely. Do not push and hold the circle down as ?this turns the monitor battery off. The cell phone will locate the monitor battery. A screen will appear on the ?cell phone checking the connection of your  monitor strip. This may read poor connection initially but change to ?good connection within the next minute. Once your monitor accepts the connection you will hear a series of 3 ?beeps followed by a climbing crescendo of beeps. A screen will appear on the cell phone showing the two ?monitor strip placement options. Touch the picture that demonstrates where you applied the monitor strip. ? ?Your monitor strip and battery are waterproof. You are able to shower, bathe, or swim with the monitor on. ?They just ask you do not submerge deeper than 3 feet underwater. We recommend removing the monitor if ?you are swimming in a lake, river, or ocean. ? ?Your monitor battery will need to be switched to a fully charged monitor battery approximately once a week. ?The cell phone will alert you of an action which needs to be made. ? ?On the cell phone, tap for details to reveal connection status, monitor battery status, and cell phone battery ?status. The green dots indicates your monitor is in good status. A red dot indicates there is something that ?needs your attention. ? ?To record a symptom, click the circle on the monitor battery. In 30-60 seconds a list of symptoms will appear on ?the cell phone. Select your symptom and tap save. Your monitor will record a sustained or significant ?arrhythmia regardless of you clicking the button. Some patients do not feel the heart rhythm irregularities. ?[employer] will notify us of any serious or critical events. ? ?Refer to instruction booklet for instructions on switching batteries, changing strips, the Do not disturb or Pause ?features, or any additional questions. ? ?Call [employer] at [phone], to confirm your monitor is transmitting and record your baseline. They will ?answer any questions you may have regarding the monitor instructions at that time. ? ?Returning the monitor to [employer] ?Place all equipment back into blue box. Peel off strip of paper to expose adhesive and  close box securely. There ?is a prepaid UPS shipping label on this box. Drop in a UPS drop box, or at a UPS facility like [employer]. You may ?also contact [employer] to arrange UPS to pick up monitor package at your home. ? ? ?Follow-Up: ?At [facility], you and your health needs are our priority.  As part of our continuing mission to provide you with exceptional heart care, we have created designated Provider Care Teams.  These Care Teams include your primary Cardiologist (physician) and Advanced Practice Providers (APPs -  Physician Assistants and Nurse Practitioners) who all work together to provide you with the care you need, when you need it. ? ?We recommend signing up for the patient portal called "MyChart".  Sign up information is provided on this After Visit Summary.  MyChart is used to connect with patients for Virtual Visits (Telemedicine).  Patients are able to view lab/test results, encounter notes, upcoming appointments, etc.  Non-urgent messages can be sent to your provider as well.   ?To learn more about what you can do with MyChart, go to [email or web address].   ? ?Your next appointment:   ?6 week(s) ? ?The format for your next appointment:   ?  In Person ? ?Provider:   ?Susann GivensHarding or [place] ? ? ? ? ?

## 2021-11-06 ENCOUNTER — Telehealth: Payer: Self-pay

## 2021-11-06 NOTE — Telephone Encounter (Addendum)
Called patient regarding results. Patient had understanding of results.----- Message from Jodelle Gross, NP sent at 11/03/2021  8:09 AM EDT ----- ?I reviewed labs. TSH is normal. I think the vilazodone may be contributing to heart palpitations. Will wait for heart monitor results.  ? ?Michele Turner ?

## 2021-11-12 ENCOUNTER — Ambulatory Visit (INDEPENDENT_AMBULATORY_CARE_PROVIDER_SITE_OTHER): Payer: 59

## 2021-11-12 DIAGNOSIS — R000 Tachycardia, unspecified: Secondary | ICD-10-CM | POA: Diagnosis not present

## 2021-11-12 DIAGNOSIS — R002 Palpitations: Secondary | ICD-10-CM | POA: Diagnosis not present

## 2021-11-16 ENCOUNTER — Telehealth: Payer: Self-pay

## 2021-11-16 ENCOUNTER — Ambulatory Visit (HOSPITAL_COMMUNITY): Payer: 59 | Attending: Cardiology

## 2021-11-16 DIAGNOSIS — R000 Tachycardia, unspecified: Secondary | ICD-10-CM

## 2021-11-16 LAB — ECHOCARDIOGRAM COMPLETE
Area-P 1/2: 4.41 cm2
S' Lateral: 2.8 cm

## 2021-11-16 NOTE — Telephone Encounter (Signed)
Lmom, to discuss echo results. Waiting on a return call.  ?

## 2021-11-19 ENCOUNTER — Telehealth: Payer: Self-pay

## 2021-11-19 NOTE — Telephone Encounter (Addendum)
Called patient regarding results left detailed message regarding results.----- Message from Jodelle Gross, NP sent at 11/16/2021 12:34 PM EDT ----- ?Echocardiogram is normal. No changes in regimen. Awaiting cardiac monitor results.  ?

## 2021-12-14 ENCOUNTER — Ambulatory Visit: Payer: 59 | Admitting: Adult Health

## 2021-12-28 ENCOUNTER — Ambulatory Visit: Payer: 59 | Admitting: Adult Health

## 2022-02-01 ENCOUNTER — Ambulatory Visit: Payer: 59 | Admitting: Adult Health

## 2022-02-08 ENCOUNTER — Telehealth: Payer: Self-pay

## 2022-02-08 NOTE — Telephone Encounter (Addendum)
Called patient regarding results. Left detailed message for patient regarding results.----- Message from Jodelle Gross, NP sent at 02/07/2022 10:49 AM EDT ----- Essentially normal cardiac monitor findings.Only one, very brief fast heart rate lasting only 4 beats. No planned changes or new testing at this time.  KL

## 2022-02-20 ENCOUNTER — Telehealth: Payer: Self-pay

## 2022-02-20 NOTE — Telephone Encounter (Addendum)
Called patient regarding results. Patient had understanding of results.----- Message from Jodelle Gross, NP sent at 02/07/2022 10:49 AM EDT ----- Essentially normal cardiac monitor findings.Only one, very brief fast heart rate lasting only 4 beats. No planned changes or new testing at this time.  KL

## 2022-04-05 ENCOUNTER — Encounter: Payer: Self-pay | Admitting: Cardiology

## 2022-04-05 ENCOUNTER — Telehealth: Payer: Self-pay

## 2022-04-05 NOTE — Telephone Encounter (Signed)
   Pre-operative Risk Assessment    Patient Name: Michele Turner  DOB: December 14, 1975 MRN: [id number]      Request for Surgical Clearance    Procedure:   Breast Surgical Revision  Date of Surgery:  Clearance 06/18/22                                 Surgeon:  Dr. Audree Bane Surgeon's Group or Practice Name:  [facility] Phone number:  [phone] Fax number:  [phone]   Type of Clearance Requested:   - Medical  - Pharmacy:  Hold Aspirin 10-14 days pre-Op and 7-14 day post-op   Type of Anesthesia:  General    Additional requests/questions:    Claudine Mouton   04/05/2022, 3:39 PM

## 2022-04-05 NOTE — Telephone Encounter (Signed)
Of - course - OK to hold ASA as long as needed.  Michele Turner

## 2022-04-08 NOTE — Telephone Encounter (Signed)
1st attempt to reach pt regarding surgical clearance and the need for a tele visit.  Left a message for pt to call back and ask for the preop team. 

## 2022-04-08 NOTE — Telephone Encounter (Signed)
Primary Cardiologist:David Ellyn Hack, MD   Preoperative team, please contact this patient and set up a phone call appointment after November 1 for further preoperative risk assessment. Please obtain consent and complete medication review. Thank you for your help.   I confirm that guidance regarding antiplatelet and oral anticoagulation therapy has been completed and, if necessary, noted below.   Emmaline Life, NP-C  04/08/2022, 9:35 AM [address], [address] Office [phone] Fax [phone]

## 2022-04-09 ENCOUNTER — Telehealth: Payer: Self-pay | Admitting: *Deleted

## 2022-04-09 NOTE — Telephone Encounter (Signed)
Pt agreeable to plan of care for tele pre op appt 05/20/22 @ 10 am. Med rec and consent are done.     Patient Consent for Virtual Visit        Michele Turner has provided verbal consent on 04/09/2022 for a virtual visit (video or telephone).   CONSENT FOR VIRTUAL VISIT FOR:  Michele Turner  By participating in this virtual visit I agree to the following:  I hereby voluntarily request, consent and authorize [facility] and its employed or contracted physicians, physician assistants, nurse practitioners or other licensed health care professionals (the Practitioner), to provide me with telemedicine health care services (the "Services") as deemed necessary by the treating Practitioner. I acknowledge and consent to receive the Services by the Practitioner via telemedicine. I understand that the telemedicine visit will involve communicating with the Practitioner through live audiovisual communication technology and the disclosure of certain medical information by electronic transmission. I acknowledge that I have been given the opportunity to request an in-person assessment or other available alternative prior to the telemedicine visit and am voluntarily participating in the telemedicine visit.  I understand that I have the right to withhold or withdraw my consent to the use of telemedicine in the course of my care at any time, without affecting my right to future care or treatment, and that the Practitioner or I may terminate the telemedicine visit at any time. I understand that I have the right to inspect all information obtained and/or recorded in the course of the telemedicine visit and may receive copies of available information for a reasonable fee.  I understand that some of the potential risks of receiving the Services via telemedicine include:  Delay or interruption in medical evaluation due to technological equipment failure or disruption; Information  transmitted may not be sufficient (e.g. poor resolution of images) to allow for appropriate medical decision making by the Practitioner; and/or  In rare instances, security protocols could fail, causing a breach of personal health information.  Furthermore, I acknowledge that it is my responsibility to provide information about my medical history, conditions and care that is complete and accurate to the best of my ability. I acknowledge that Practitioner's advice, recommendations, and/or decision may be based on factors not within their control, such as incomplete or inaccurate data provided by me or distortions of diagnostic images or specimens that may result from electronic transmissions. I understand that the practice of medicine is not an exact science and that Practitioner makes no warranties or guarantees regarding treatment outcomes. I acknowledge that a copy of this consent can be made available to me via my patient portal ([facility]), or I can request a printed copy by calling the office of [facility].    I understand that my insurance will be billed for this visit.   I have read or had this consent read to me. I understand the contents of this consent, which adequately explains the benefits and risks of the Services being provided via telemedicine.  I have been provided ample opportunity to ask questions regarding this consent and the Services and have had my questions answered to my satisfaction. I give my informed consent for the services to be provided through the use of telemedicine in my medical care

## 2022-04-09 NOTE — Telephone Encounter (Signed)
Pt agreeable to plan of care for tele pre op appt 05/20/22 @ 10 am. Med rec and consent are done.

## 2022-04-16 ENCOUNTER — Other Ambulatory Visit (HOSPITAL_COMMUNITY): Payer: Self-pay

## 2022-04-16 MED ORDER — OZEMPIC (2 MG/DOSE) 8 MG/3ML ~~LOC~~ SOPN
2.0000 mg | PEN_INJECTOR | SUBCUTANEOUS | 2 refills | Status: AC
  Filled 2022-04-16: qty 9, 84d supply, fill #0
  Filled 2022-08-19: qty 9, 84d supply, fill #1
  Filled 2022-11-13: qty 9, 84d supply, fill #2

## 2022-04-17 ENCOUNTER — Other Ambulatory Visit (HOSPITAL_COMMUNITY): Payer: Self-pay

## 2022-04-18 ENCOUNTER — Other Ambulatory Visit (HOSPITAL_COMMUNITY): Payer: Self-pay

## 2022-05-07 ENCOUNTER — Ambulatory Visit: Payer: 59 | Attending: Cardiology | Admitting: Physician Assistant

## 2022-05-07 DIAGNOSIS — Z01810 Encounter for preprocedural cardiovascular examination: Secondary | ICD-10-CM
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
## 2022-05-13 ENCOUNTER — Other Ambulatory Visit: Payer: Self-pay | Admitting: Family

## 2022-05-13 DIAGNOSIS — I82402 Acute embolism and thrombosis of unspecified deep veins of left lower extremity: Secondary | ICD-10-CM

## 2022-05-14 ENCOUNTER — Encounter: Payer: Self-pay | Admitting: Family

## 2022-05-14 ENCOUNTER — Inpatient Hospital Stay: Payer: 59 | Attending: Hematology & Oncology

## 2022-05-14 ENCOUNTER — Inpatient Hospital Stay: Payer: 59 | Admitting: Family

## 2022-05-14 VITALS — BP 116/55 | HR 65 | Temp 97.9°F | Resp 17

## 2022-05-14 DIAGNOSIS — Z7982 Long term (current) use of aspirin: Secondary | ICD-10-CM | POA: Diagnosis not present

## 2022-05-14 DIAGNOSIS — Z79899 Other long term (current) drug therapy: Secondary | ICD-10-CM | POA: Diagnosis not present

## 2022-05-14 DIAGNOSIS — I82402 Acute embolism and thrombosis of unspecified deep veins of left lower extremity: Secondary | ICD-10-CM

## 2022-05-14 DIAGNOSIS — Z86718 Personal history of other venous thrombosis and embolism: Secondary | ICD-10-CM | POA: Diagnosis not present

## 2022-05-14 DIAGNOSIS — E7212 Methylenetetrahydrofolate reductase deficiency: Secondary | ICD-10-CM | POA: Diagnosis not present

## 2022-05-14 DIAGNOSIS — G5603 Carpal tunnel syndrome, bilateral upper limbs: Secondary | ICD-10-CM | POA: Insufficient documentation

## 2022-05-14 DIAGNOSIS — R002 Palpitations: Secondary | ICD-10-CM | POA: Insufficient documentation

## 2022-05-14 LAB — CMP (CANCER CENTER ONLY)
ALT: 13 U/L (ref 0–44)
AST: 19 U/L (ref 15–41)
Albumin: 4.6 g/dL (ref 3.5–5.0)
Alkaline Phosphatase: 43 U/L (ref 38–126)
Anion gap: 8 (ref 5–15)
BUN: 18 mg/dL (ref 6–20)
CO2: 27 mmol/L (ref 22–32)
Calcium: 9.1 mg/dL (ref 8.9–10.3)
Chloride: 100 mmol/L (ref 98–111)
Creatinine: 0.84 mg/dL (ref 0.44–1.00)
GFR, Estimated: >60 mL/min (ref >60)
Glucose, Bld: 112 mg/dL — ABNORMAL HIGH (ref 70–99)
Potassium: 3.9 mmol/L (ref 3.5–5.1)
Sodium: 135 mmol/L (ref 135–145)
Total Bilirubin: 0.4 mg/dL (ref 0.3–1.2)
Total Protein: 7.4 g/dL (ref 6.5–8.1)

## 2022-05-14 LAB — CBC WITH DIFFERENTIAL (CANCER CENTER ONLY)
Abs Immature Granulocytes: 0.01 10*3/uL (ref 0.00–0.07)
Basophils Absolute: 0.1 10*3/uL (ref 0.0–0.1)
Basophils Relative: 1 %
Eosinophils Absolute: 0.1 10*3/uL (ref 0.0–0.5)
Eosinophils Relative: 1 %
HCT: 39.8 % (ref 36.0–46.0)
Hemoglobin: 13.5 g/dL (ref 12.0–15.0)
Immature Granulocytes: 0 %
Lymphocytes Relative: 21 %
Lymphs Abs: 1.3 10*3/uL (ref 0.7–4.0)
MCH: 30.5 pg (ref 26.0–34.0)
MCHC: 33.9 g/dL (ref 30.0–36.0)
MCV: 90 fL (ref 80.0–100.0)
Monocytes Absolute: 0.7 10*3/uL (ref 0.1–1.0)
Monocytes Relative: 12 %
Neutro Abs: 3.9 10*3/uL (ref 1.7–7.7)
Neutrophils Relative %: 65 %
Platelet Count: 325 10*3/uL (ref 150–400)
RBC: 4.42 MIL/uL (ref 3.87–5.11)
RDW: 12 % (ref 11.5–15.5)
WBC Count: 6 10*3/uL (ref 4.0–10.5)
nRBC: 0 % (ref 0.0–0.2)

## 2022-05-14 MED ORDER — ENOXAPARIN SODIUM 60 MG/0.6ML IJ SOSY: 60.0000 mg | PREFILLED_SYRINGE | INTRAMUSCULAR | 0 refills | Status: DC

## 2022-05-14 NOTE — Progress Notes (Unsigned)
Hematology and Oncology Follow Up Visit  Michele Turner SN:[id number] Oct 14, 1975 46 y.o. 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  She has prior history of left lower extremity DVT diagnosed in 2010. Her hypercoag panel was negative.  She had been on the contraceptive Yaz and travelled by plane to [place] during that time. The birth control was stopped immediately and she was treated with Coumadin She was referred to our office in 2017 to determine if she could come off of Coumadin. She was transitioned to 2 EC baby aspirin PO daily with food.  She is currently on 1 EC baby aspirin PO daily due to bruising on 2 baby aspirin.  In 2021 she was found to be homozygous for the [id number] MTHFR gene mutation.  She has had no new thrombotic events.  Her mother is heterozygous for the MTHFR gene mutation and has history of PE.  She has an IUD in and no cycle.  No blood loss, bruising or petechiae.  She had bilateral carpal tunnel release in 2018 without any complications.  No fever, chills, n/v, cough, rash, dizziness, SOB, chest pain, palpitations, abdominal pain or changes in bowel or bladder habits at this time.  She has occasional palpitations which she associates with dehydration. She states that her work up with cardiology has been negative.  No swelling, tenderness, numbness or tingling in her extremities.  No falls or syncope.  Appetite and hydration are good. Weight is stable at 124 lbs.   ECOG Performance Status: 0 - Asymptomatic  Medications:  Allergies as of 05/14/2022   No Known Allergies      Medication List        Accurate as of May 14, 2022  3:31 PM. If you have any  questions, ask your nurse or doctor.          ALPRAZolam 0.25 MG tablet Commonly known as: XANAX Take 1 tablet (0.25 mg total) by mouth at bedtime as needed for anxiety.   ARIPiprazole 5 MG tablet Commonly known as: ABILIFY Take 5 mg by mouth daily.   aspirin 81 MG chewable tablet Chew 81 mg by mouth daily.   b complex vitamins capsule Take 1 capsule by mouth daily. methaolated b complex   cetirizine 10 MG tablet Commonly known as: ZYRTEC Take 10 mg by mouth daily.   Cholecalciferol 125 MCG (5000 UT) Tabs Take 1 tablet by mouth daily.   dicyclomine 20 MG tablet Commonly known as: BENTYL Take 1 tablet (20 mg total) by mouth 4 (four) times daily -  before meals and at bedtime.   famotidine 20 MG tablet Commonly known as: PEPCID Take 20 mg by mouth daily as needed for heartburn or indigestion.   hydrocortisone 2.5 % ointment as needed.   levonorgestrel 20 MCG/24HR IUD Commonly known as: MIRENA 1 each by Intrauterine route once. September 07, 2019   lisdexamfetamine 70 MG capsule Commonly known as: Vyvanse Take 1 capsule (70 mg total) by mouth daily.   Ozempic (1 MG/DOSE) 4 MG/3ML Sopn Generic drug: Semaglutide (1 MG/DOSE) Inject 1 mg into the skin once a week.   Ozempic (2 MG/DOSE) 8 MG/3ML Sopn Generic drug: Semaglutide (2 MG/DOSE) Inject  2 mg into the skin once a week.   pantoprazole 40 MG tablet Commonly known as: PROTONIX Take 1 tablet (40 mg total) by mouth 2 (two) times daily.   sucralfate 1 g tablet Commonly known as: Carafate Take 1 tablet (1 g total) by mouth 4 (four) times daily -  with meals and at bedtime. Dissolve 1 tablet in a small amount of water and take qid   tizanidine 2 MG capsule Commonly known as: ZANAFLEX Take 1-2 capsules (2-4 mg total) by mouth 3 (three) times daily as needed for muscle spasms.   valACYclovir 1000 MG tablet Commonly known as: VALTREX Take 2 tablets at onset of cold sore, and repeat in 12 hours (max 4 pills per cold  sore)   Vilazodone HCl 10 MG Tabs Commonly known as: Viibryd Take 1 tablet (10 mg total) by mouth daily.        Allergies: No Known Allergies  Past Medical History, Surgical history, Social history, and Family History were reviewed and updated.  Review of Systems: All other 10 point review of systems is negative.   Physical Exam:  oral temperature is 97.9 F (36.6 C). Her blood pressure is 116/55 (abnormal) and her pulse is 65. Her respiration is 17 and oxygen saturation is 100%.   Wt Readings from Last 3 Encounters:  11/02/21 126 lb 6.4 oz (57.3 kg)  11/01/21 125 lb (56.7 kg)  10/22/21 127 lb 6.4 oz (57.8 kg)    Ocular: Sclerae unicteric, pupils equal, round and reactive to light Ear-nose-throat: Oropharynx clear, dentition fair Lymphatic: No cervical or supraclavicular adenopathy Lungs no rales or rhonchi, good excursion bilaterally Heart regular rate and rhythm, no murmur appreciated Abd soft, nontender, positive bowel sounds MSK no focal spinal tenderness, no joint edema Neuro: non-focal, well-oriented, appropriate affect Breasts: Deferred   Lab Results  Component Value Date   WBC 6.0 05/14/2022   HGB 13.5 05/14/2022   HCT 39.8 05/14/2022   MCV 90.0 05/14/2022   PLT 325 05/14/2022   Lab Results  Component Value Date   FERRITIN 58.0 06/08/2014   IRON 89 06/08/2014   Lab Results  Component Value Date   RBC 4.42 05/14/2022   No results found for: "KPAFRELGTCHN", "LAMBDASER", "KAPLAMBRATIO" No results found for: "IGGSERUM", "IGA", "IGMSERUM" No results found for: "TOTALPROTELP", "ALBUMINELP", "A1GS", "A2GS", "BETS", "BETA2SER", "GAMS", "MSPIKE", "SPEI"   Chemistry      Component Value Date/Time   NA 135 05/14/2022 1028   NA 138 11/06/2015 0912   NA 138 10/05/2015 0956   K 3.9 05/14/2022 1028   K 3.9 11/06/2015 0912   K 3.7 10/05/2015 0956   CL 100 05/14/2022 1028   CL 102 11/06/2015 0912   CO2 27 05/14/2022 1028   CO2 29 11/06/2015 0912   CO2 23  10/05/2015 0956   BUN 18 05/14/2022 1028   BUN 22 11/06/2015 0912   BUN 26.5 (H) 10/05/2015 0956   CREATININE 0.84 05/14/2022 1028   CREATININE 0.9 11/06/2015 0912   CREATININE 0.8 10/05/2015 0956      Component Value Date/Time   CALCIUM 9.1 05/14/2022 1028   CALCIUM 8.7 11/06/2015 0912   CALCIUM 8.9 10/05/2015 0956   ALKPHOS 43 05/14/2022 1028   ALKPHOS 51 11/06/2015 0912   ALKPHOS 55 10/05/2015 0956   AST 19 05/14/2022 1028   AST 29 10/05/2015 0956   ALT 13 05/14/2022 1028   ALT 24 11/06/2015 0912   ALT 22 10/05/2015 0956   BILITOT 0.4 05/14/2022 1028  BILITOT <0.30 10/05/2015 [id number]       Impression and Plan:   Lottie Dawson, NP 11/7/20233:31 PM

## 2022-05-20 ENCOUNTER — Telehealth: Payer: 59

## 2022-05-21 ENCOUNTER — Encounter: Payer: Self-pay | Admitting: Family

## 2022-05-24 ENCOUNTER — Other Ambulatory Visit: Payer: Self-pay | Admitting: Family

## 2022-05-24 ENCOUNTER — Telehealth: Payer: Self-pay | Admitting: Family

## 2022-05-24 DIAGNOSIS — Z79899 Other long term (current) drug therapy: Secondary | ICD-10-CM

## 2022-05-24 DIAGNOSIS — I82402 Acute embolism and thrombosis of unspecified deep veins of left lower extremity: Secondary | ICD-10-CM

## 2022-05-24 MED ORDER — ENOXAPARIN SODIUM 40 MG/0.4ML IJ SOSY: 40.0000 mg | PREFILLED_SYRINGE | INTRAMUSCULAR | 0 refills | Status: DC

## 2022-05-24 MED ORDER — FOLIC ACID 1 MG PO TABS: 1.0000 mg | ORAL_TABLET | Freq: Every day | ORAL | 11 refills | Status: DC

## 2022-05-24 NOTE — Telephone Encounter (Signed)
Left message on patient's personal cell phone to let her know prescriptions for both Lovenox and folic acid have been sent to her preferred pharmacy. Call back number given as well for any questions or concerns.

## 2022-07-12 ENCOUNTER — Other Ambulatory Visit: Payer: Self-pay | Admitting: Family

## 2022-07-12 DIAGNOSIS — I82402 Acute embolism and thrombosis of unspecified deep veins of left lower extremity: Secondary | ICD-10-CM

## 2022-07-15 ENCOUNTER — Inpatient Hospital Stay (HOSPITAL_BASED_OUTPATIENT_CLINIC_OR_DEPARTMENT_OTHER): Payer: 59 | Admitting: Family

## 2022-07-15 ENCOUNTER — Encounter: Payer: Self-pay | Admitting: Family

## 2022-07-15 ENCOUNTER — Inpatient Hospital Stay: Payer: 59 | Attending: Hematology & Oncology

## 2022-07-15 VITALS — BP 115/75 | HR 76 | Temp 97.7°F | Resp 17 | Wt 125.4 lb

## 2022-07-15 DIAGNOSIS — Z86718 Personal history of other venous thrombosis and embolism: Secondary | ICD-10-CM | POA: Diagnosis present

## 2022-07-15 DIAGNOSIS — I82402 Acute embolism and thrombosis of unspecified deep veins of left lower extremity: Secondary | ICD-10-CM

## 2022-07-15 DIAGNOSIS — E7212 Methylenetetrahydrofolate reductase deficiency: Secondary | ICD-10-CM | POA: Diagnosis not present

## 2022-07-15 DIAGNOSIS — Z79899 Other long term (current) drug therapy: Secondary | ICD-10-CM | POA: Diagnosis not present

## 2022-07-15 DIAGNOSIS — Z7982 Long term (current) use of aspirin: Secondary | ICD-10-CM | POA: Diagnosis not present

## 2022-07-15 LAB — CBC WITH DIFFERENTIAL (CANCER CENTER ONLY)
Abs Immature Granulocytes: 0.02 10*3/uL (ref 0.00–0.07)
Basophils Absolute: 0 10*3/uL (ref 0.0–0.1)
Basophils Relative: 1 %
Eosinophils Absolute: 0.1 10*3/uL (ref 0.0–0.5)
Eosinophils Relative: 1 %
HCT: 42 % (ref 36.0–46.0)
Hemoglobin: 14.1 g/dL (ref 12.0–15.0)
Immature Granulocytes: 0 %
Lymphocytes Relative: 22 %
Lymphs Abs: 1.3 10*3/uL (ref 0.7–4.0)
MCH: 30.3 pg (ref 26.0–34.0)
MCHC: 33.6 g/dL (ref 30.0–36.0)
MCV: 90.3 fL (ref 80.0–100.0)
Monocytes Absolute: 0.8 10*3/uL (ref 0.1–1.0)
Monocytes Relative: 12 %
Neutro Abs: 3.9 10*3/uL (ref 1.7–7.7)
Neutrophils Relative %: 64 %
Platelet Count: 331 10*3/uL (ref 150–400)
RBC: 4.65 MIL/uL (ref 3.87–5.11)
RDW: 11.9 % (ref 11.5–15.5)
WBC Count: 6.1 10*3/uL (ref 4.0–10.5)
nRBC: 0 % (ref 0.0–0.2)

## 2022-07-15 LAB — CMP (CANCER CENTER ONLY)
ALT: 15 U/L (ref 0–44)
AST: 20 U/L (ref 15–41)
Albumin: 4.8 g/dL (ref 3.5–5.0)
Alkaline Phosphatase: 44 U/L (ref 38–126)
Anion gap: 9 (ref 5–15)
BUN: 15 mg/dL (ref 6–20)
CO2: 29 mmol/L (ref 22–32)
Calcium: 9.4 mg/dL (ref 8.9–10.3)
Chloride: 101 mmol/L (ref 98–111)
Creatinine: 0.87 mg/dL (ref 0.44–1.00)
GFR, Estimated: >60 mL/min (ref >60)
Glucose, Bld: 102 mg/dL — ABNORMAL HIGH (ref 70–99)
Potassium: 4.4 mmol/L (ref 3.5–5.1)
Sodium: 139 mmol/L (ref 135–145)
Total Bilirubin: 0.4 mg/dL (ref 0.3–1.2)
Total Protein: 7.8 g/dL (ref 6.5–8.1)

## 2022-07-15 LAB — LACTATE DEHYDROGENASE: LDH: 141 U/L (ref 98–192)

## 2022-07-15 NOTE — Progress Notes (Signed)
Hematology and Oncology Follow Up Visit  Michele Turner [id number] 01/21/1976 47 y.o. 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 She states that her breast augmentation revision with scar tissue removal in December went well. She is now 4 weeks post.  She denies any issue with bleeding. No bruising or petechiae occurred. She had no issue with infection. She did have a mild red splotchy rash that occurred a few days after surgery along both breasts. This resolved on its own without any intervention.  No fever, chills, n/v, cough, rash, dizziness, SOB, chest pain, palpitations, abdominal pain or changes in bowel or bladder habits.  No swelling, tenderness, numbness or tingling in her extremities.  No falls or syncope.  Appetite and hydration are good. Weight is stable at 125 lbs.   ECOG Performance Status: 0 - Asymptomatic  Medications:  Allergies as of 07/15/2022   No Known Allergies      Medication List        Accurate as of July 15, 2022  2:04 PM. If you have any questions, ask your nurse or doctor.          ALPRAZolam 0.25 MG tablet Commonly known as: XANAX Take 1 tablet (0.25 mg total) by mouth at bedtime as needed for anxiety.   ARIPiprazole 5 MG tablet Commonly known as: ABILIFY Take 5 mg by mouth daily.   aspirin 81 MG chewable tablet Chew 81 mg by mouth daily.   b complex vitamins capsule Take 1 capsule by mouth daily. methaolated b complex   cetirizine 10 MG tablet Commonly known as: ZYRTEC Take 10 mg by mouth daily.   Cholecalciferol 125 MCG (5000 UT) Tabs Take 1 tablet by mouth daily.   dicyclomine 20 MG tablet Commonly known as: BENTYL Take 1 tablet (20 mg total) by mouth 4 (four) times daily -   before meals and at bedtime.   enoxaparin 40 MG/0.4ML injection Commonly known as: LOVENOX Inject 0.4 mLs (40 mg total) into the skin daily. Start 6-8 hours post surgery with Dr. Trevor Iha per her discretion.   famotidine 20 MG tablet Commonly known as: PEPCID Take 20 mg by mouth daily as needed for heartburn or indigestion.   folic acid 1 MG tablet Commonly known as: FOLVITE Take 1 tablet (1 mg total) by mouth daily.   hydrocortisone 2.5 % ointment as needed.   levonorgestrel 20 MCG/24HR IUD Commonly known as: MIRENA 1 each by Intrauterine route once. September 07, 2019   lisdexamfetamine 70 MG capsule Commonly known as: Vyvanse Take 1 capsule (70 mg total) by mouth daily.   Ozempic (1 MG/DOSE) 4 MG/3ML Sopn Generic drug: Semaglutide (1 MG/DOSE) Inject 1 mg into the skin once a week.   Ozempic (2 MG/DOSE) 8 MG/3ML Sopn Generic drug: Semaglutide (2 MG/DOSE) Inject 2 mg into the skin once a week.   pantoprazole 40 MG tablet Commonly known as: PROTONIX Take 1 tablet (40 mg total) by mouth 2 (two) times daily.   sucralfate 1 g tablet Commonly known as: Carafate Take 1 tablet (1 g total) by mouth 4 (four) times daily -  with meals and at bedtime. Dissolve 1 tablet in a small amount of water and take qid  tizanidine 2 MG capsule Commonly known as: ZANAFLEX Take 1-2 capsules (2-4 mg total) by mouth 3 (three) times daily as needed for muscle spasms.   valACYclovir 1000 MG tablet Commonly known as: VALTREX Take 2 tablets at onset of cold sore, and repeat in 12 hours (max 4 pills per cold sore)   Vilazodone HCl 10 MG Tabs Commonly known as: Viibryd Take 1 tablet (10 mg total) by mouth daily.        Allergies: No Known Allergies  Past Medical History, Surgical history, Social history, and Family History were reviewed and updated.  Review of Systems: All other 10 point review of systems is negative.   Physical Exam:  vitals were not taken for this visit.   Wt  Readings from Last 3 Encounters:  11/02/21 126 lb 6.4 oz (57.3 kg)  11/01/21 125 lb (56.7 kg)  10/22/21 127 lb 6.4 oz (57.8 kg)    Ocular: Sclerae unicteric, pupils equal, round and reactive to light Ear-nose-throat: Oropharynx clear, dentition fair Lymphatic: No cervical or supraclavicular adenopathy Lungs no rales or rhonchi, good excursion bilaterally Heart regular rate and rhythm, no murmur appreciated Abd soft, nontender, positive bowel sounds MSK no focal spinal tenderness, no joint edema Neuro: non-focal, well-oriented, appropriate affect Breasts: Deferred   Lab Results  Component Value Date   WBC 6.0 05/14/2022   HGB 13.5 05/14/2022   HCT 39.8 05/14/2022   MCV 90.0 05/14/2022   PLT 325 05/14/2022   Lab Results  Component Value Date   FERRITIN 58.0 06/08/2014   IRON 89 06/08/2014   Lab Results  Component Value Date   RBC 4.42 05/14/2022   No results found for: "KPAFRELGTCHN", "LAMBDASER", "KAPLAMBRATIO" No results found for: "IGGSERUM", "IGA", "IGMSERUM" No results found for: "TOTALPROTELP", "ALBUMINELP", "A1GS", "A2GS", "BETS", "BETA2SER", "GAMS", "MSPIKE", "SPEI"   Chemistry      Component Value Date/Time   NA 135 05/14/2022 1028   NA 138 11/06/2015 0912   NA 138 10/05/2015 0956   K 3.9 05/14/2022 1028   K 3.9 11/06/2015 0912   K 3.7 10/05/2015 0956   CL 100 05/14/2022 1028   CL 102 11/06/2015 0912   CO2 27 05/14/2022 1028   CO2 29 11/06/2015 0912   CO2 23 10/05/2015 0956   BUN 18 05/14/2022 1028   BUN 22 11/06/2015 0912   BUN 26.5 (H) 10/05/2015 0956   CREATININE 0.84 05/14/2022 1028   CREATININE 0.9 11/06/2015 0912   CREATININE 0.8 10/05/2015 0956      Component Value Date/Time   CALCIUM 9.1 05/14/2022 1028   CALCIUM 8.7 11/06/2015 0912   CALCIUM 8.9 10/05/2015 0956   ALKPHOS 43 05/14/2022 1028   ALKPHOS 51 11/06/2015 0912   ALKPHOS 55 10/05/2015 0956   AST 19 05/14/2022 1028   AST 29 10/05/2015 0956   ALT 13 05/14/2022 1028   ALT 24  11/06/2015 0912   ALT 22 10/05/2015 0956   BILITOT 0.4 05/14/2022 1028   BILITOT <0.30 10/05/2015 0956       Impression and Plan: Michele Turner is a very pleasant 47 yo caucasian female with history of chronic left lower extremity DVT diagnosed in 2010. She completed 7 years of coumadin and then transitioned to 2 baby aspirin daily.  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  She will follow up with [place] as needed.   Eileen Stanford, NP 1/8/20242:04 PM

## 2022-07-18 ENCOUNTER — Telehealth: Payer: Self-pay | Admitting: *Deleted

## 2022-07-18 NOTE — Telephone Encounter (Signed)
Per 07/15/22 los - :Follow-up as needed.

## 2022-10-01 IMAGING — DX DG HIP (WITH OR WITHOUT PELVIS) 2-3V*L*
3 series · 3 of 3 positions shown · non-contrast
Comparison: None.

CLINICAL DATA: Left hip and groin pain for 5 days,

EXAM:
DG HIP (WITH OR WITHOUT PELVIS) 2-3V LEFT

[pelvis ap]
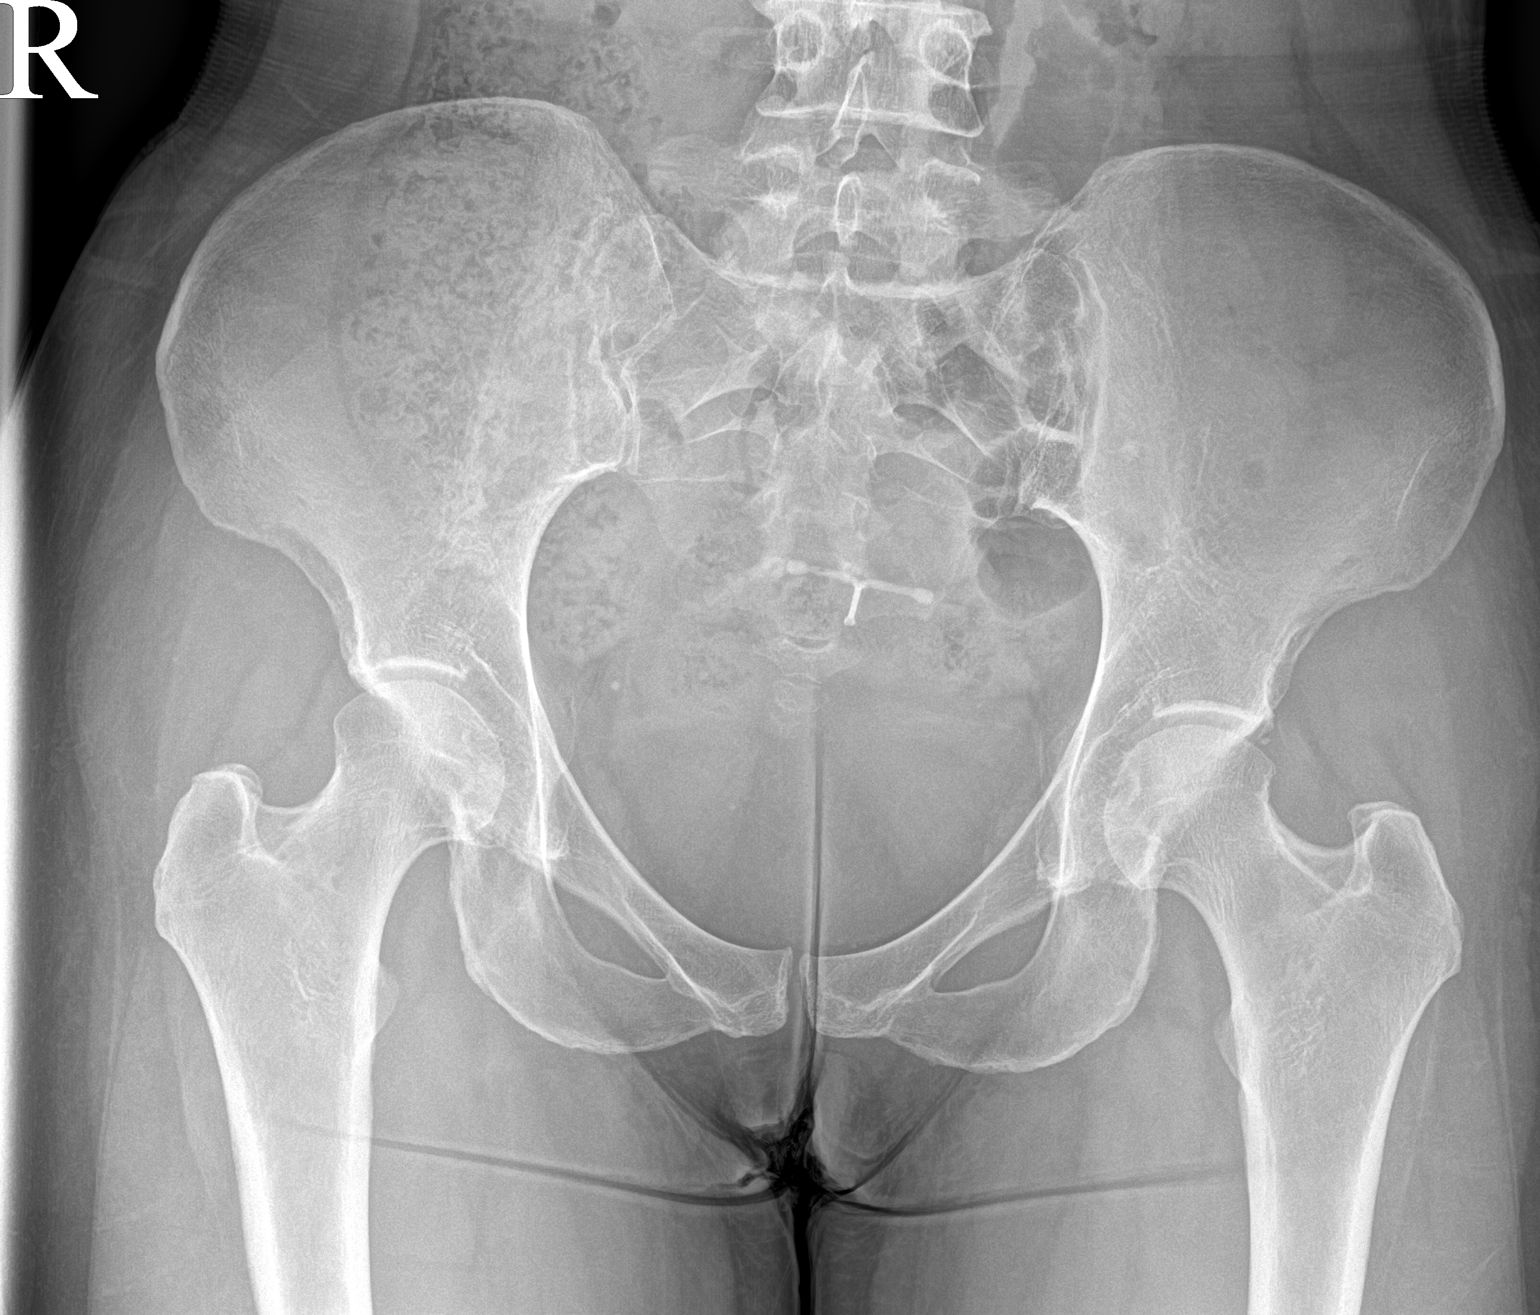

[hip ap]
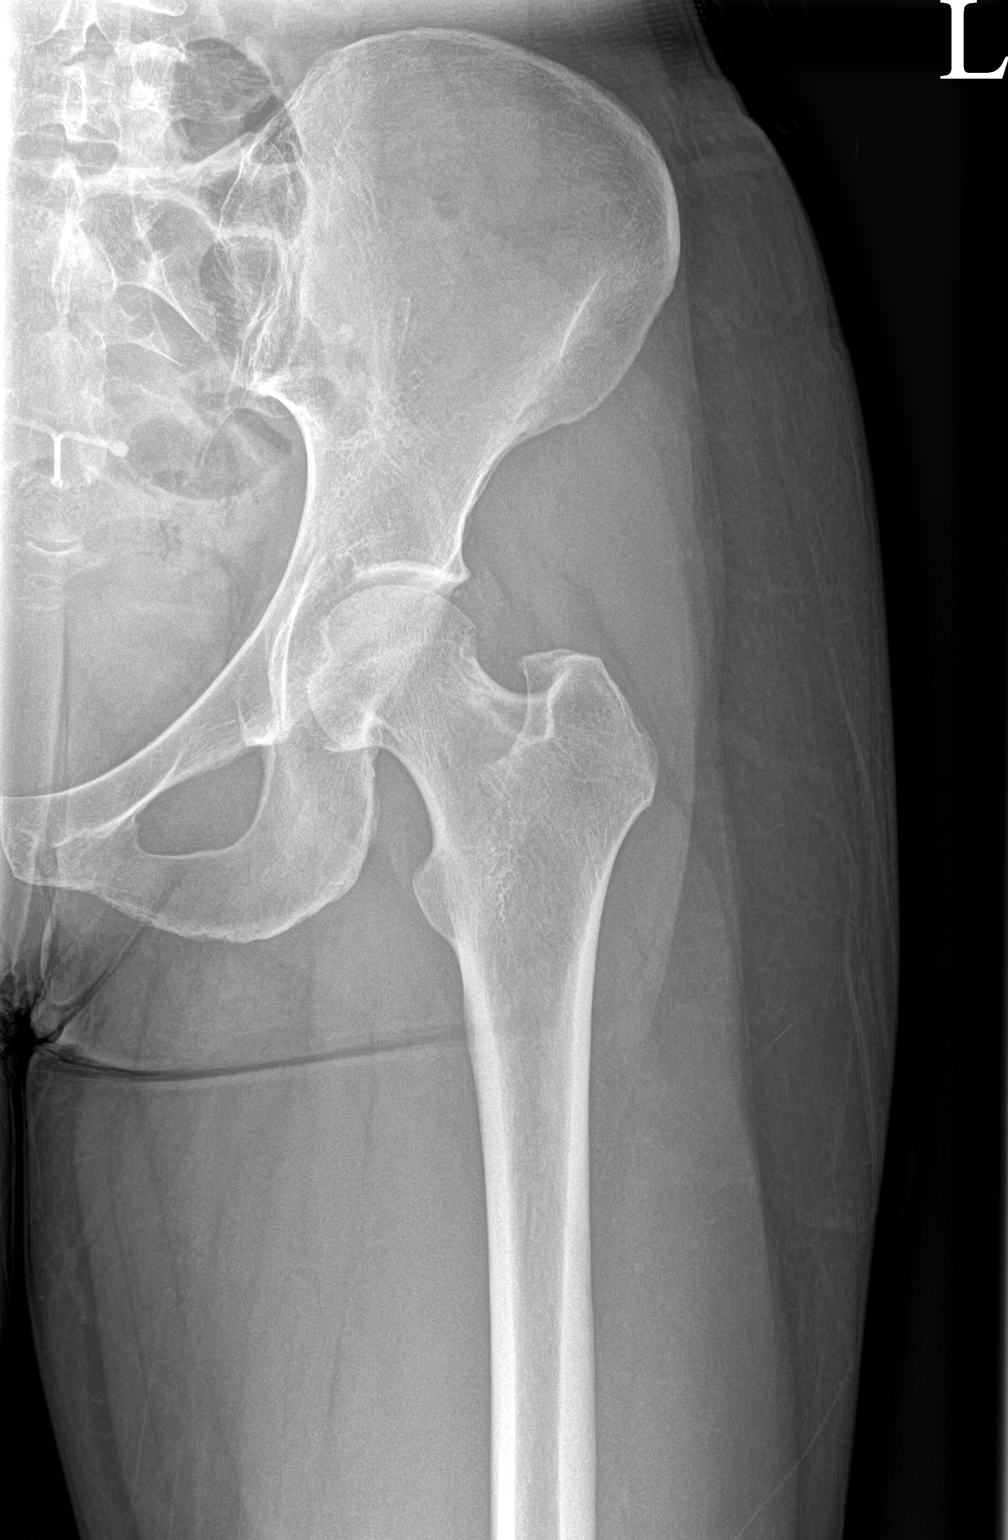

[hip frog leg]
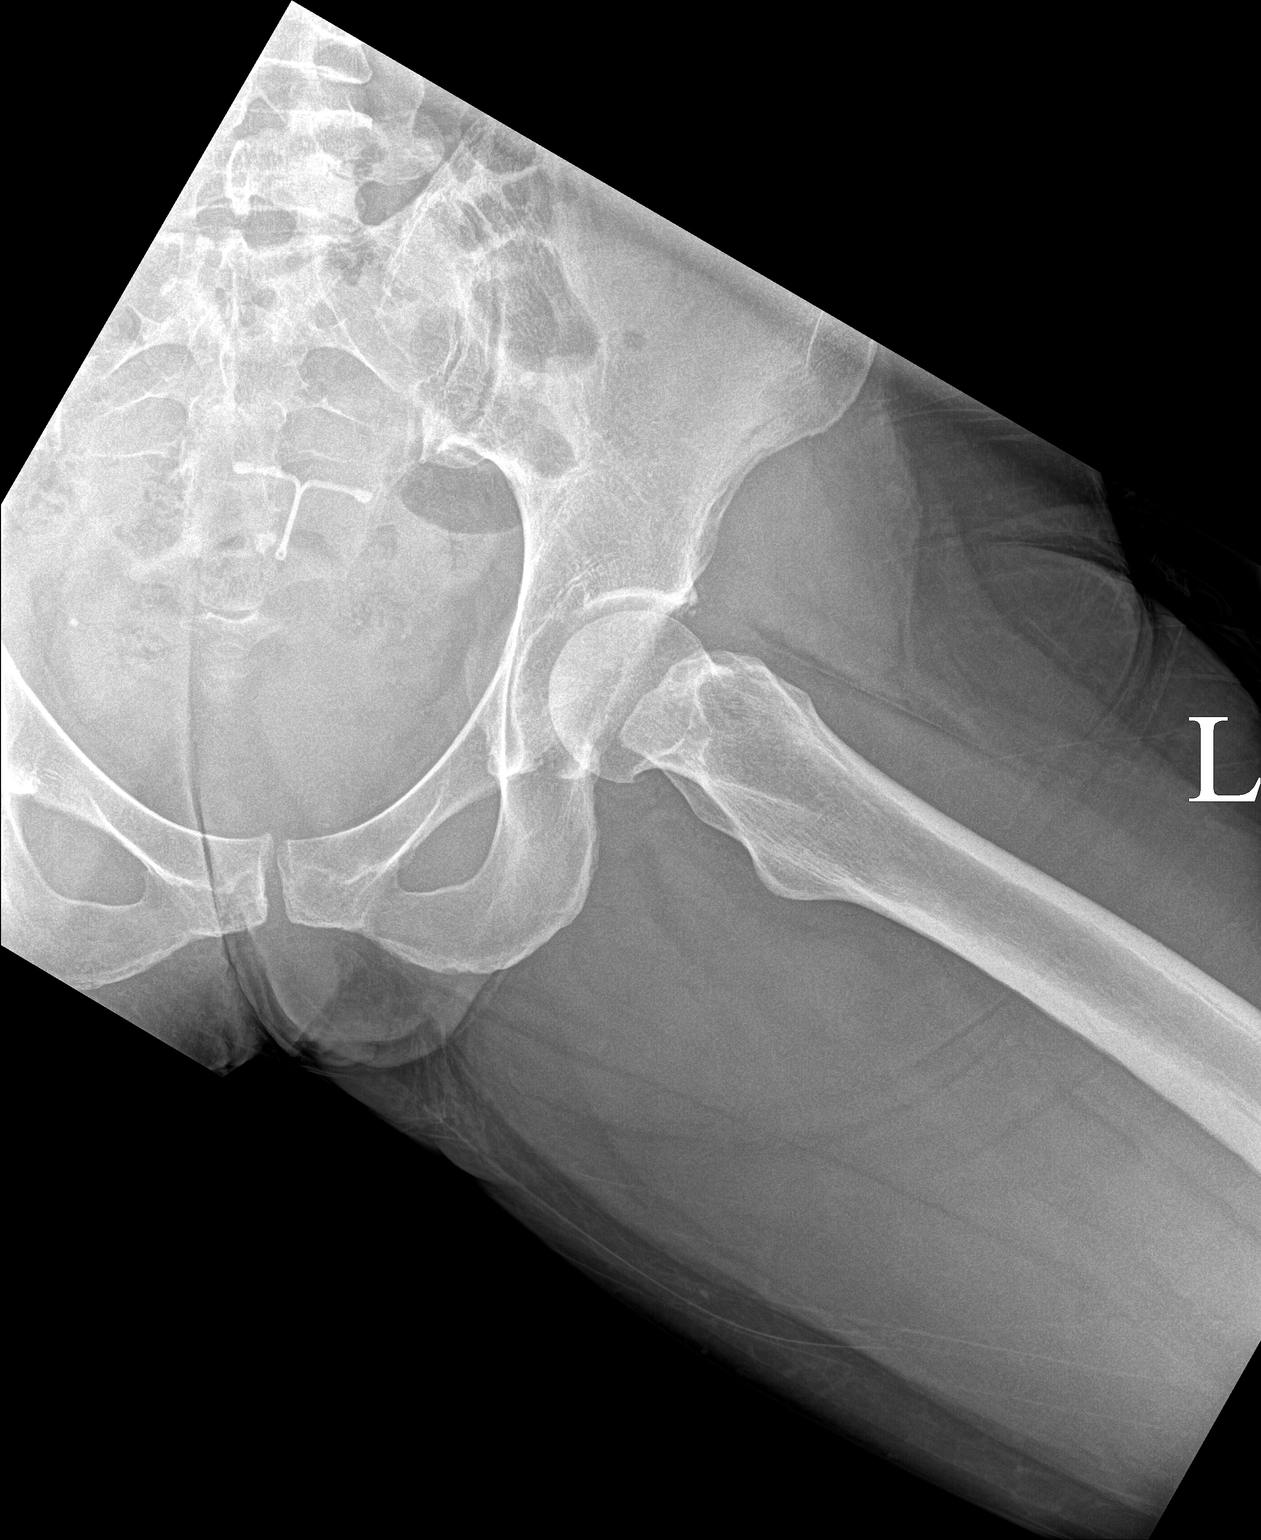

[3 of 3 positions shown; findings below may reference images not displayed]

FINDINGS: There is no evidence of hip fracture or dislocation. Small ossicle
adjacent to the superior acetabular margin. The joint space is
otherwise preserved without significant joint space loss or
osteophytosis. IUD projects in the mid pelvis.
IMPRESSION: No fracture or dislocation of the left hip. Small ossicle adjacent
to the superior acetabular margin, which may be degenerative or
posttraumatic in nature. The joint space is otherwise preserved
without significant joint space loss or osteophytosis.

## 2022-11-13 ENCOUNTER — Other Ambulatory Visit (HOSPITAL_BASED_OUTPATIENT_CLINIC_OR_DEPARTMENT_OTHER): Payer: Self-pay

## 2022-11-14 ENCOUNTER — Other Ambulatory Visit (HOSPITAL_BASED_OUTPATIENT_CLINIC_OR_DEPARTMENT_OTHER): Payer: Self-pay

## 2022-12-06 ENCOUNTER — Other Ambulatory Visit: Payer: Self-pay | Admitting: Obstetrics & Gynecology

## 2022-12-06 DIAGNOSIS — Z0000 Encounter for general adult medical examination without abnormal findings: Secondary | ICD-10-CM

## 2022-12-24 ENCOUNTER — Ambulatory Visit: Payer: 59

## 2023-01-08 ENCOUNTER — Ambulatory Visit: Payer: 59

## 2023-04-23 ENCOUNTER — Ambulatory Visit (INDEPENDENT_AMBULATORY_CARE_PROVIDER_SITE_OTHER): Payer: 59

## 2023-04-23 ENCOUNTER — Ambulatory Visit: Payer: 59 | Admitting: Family Medicine

## 2023-04-23 ENCOUNTER — Encounter: Payer: Self-pay | Admitting: Family Medicine

## 2023-04-23 VITALS — BP 124/78 | HR 82 | Ht 64.0 in | Wt 122.0 lb

## 2023-04-23 DIAGNOSIS — M5412 Radiculopathy, cervical region: Secondary | ICD-10-CM | POA: Diagnosis not present

## 2023-04-23 MED ORDER — PREDNISONE 50 MG PO TABS: 50.0000 mg | ORAL_TABLET | Freq: Every day | ORAL | 0 refills | Status: DC

## 2023-04-23 NOTE — Patient Instructions (Addendum)
Thank you for coming in today.   Continue PT and dry needling.   In about 1 month if you are not improving or sooner if you are worsening we can proceed to MRI [facility]ine for injection planning.   Take the prednisone for 5 days and use gabapentin as needed.

## 2023-04-23 NOTE — Progress Notes (Signed)
   I, Stevenson Clinch, CMA acting as a scribe for Clementeen Graham, MD.  Michele Turner is a 47 y.o. female who presents to [facility] at [facility] today for neck pain. Pt was previously seen by Dr. Denyse Amass on 09/04/21 for bilat shoulder pain.  Today, pt c/o neck pain since  04/06/23. Pt locates pain to right side of the neck. Cutting pain in subscapular region with tilting head back and looking to the right. Supraspinatus and infraspinatus, teres minor, has many trigger points. Some weakness in the biceps and hand. Has started Pred and Gabapentin this past Friday.   Started PT on Sep 29th.  She has done some dry needling which is helpful.  She has more physical therapy scheduled at the end of this month.  She continues to work on home exercise program.  Radiates: yes UE Numbness/tingling: no UE Weakness: decreased grip strength.  Aggravates: tilting head back and looking right Treatments tried: stretching, dry-needling, Aleve  Pertinent review of systems: No fevers or chills  Relevant historical information: History of DVT.   Exam:  BP 124/78   Pulse 82   Ht 5\' 4"  (1.626 m)   Wt 122 lb (55.3 kg)   SpO2 99%   BMI 20.94 kg/m  General: Well Developed, well nourished, and in no acute distress.   MSK: C-spine: Normal appearing Nontender palpation spinal midline. Tender palpation right trapezius and rhomboid area. Normal lumbar motion. Positive right-sided Spurling's test. Upper extremity strength reflexes and sensation are intact.    Lab and Radiology Results  X-ray images C-spine obtained today personally and independently interpreted DDD worse C5-6.  No acute fractures are visible. Await formal radiology review   Assessment and Plan: 47 y.o. female with right arm pain thought to be cervical radiculopathy likely C5 based on location of pain and location of worst degenerative changes C-spine.  She is already starting on the correct treatment with  physical therapy.  Will add prednisone and gabapentin.  If not improving in 1 month would consider MRI.  Will get an MRI sooner if worsening.  She feels some weakness but I cannot feel that in clinic today.  She may be more sensitive than I am able to be.    PDMP not reviewed this encounter. Orders Placed This Encounter  Procedures   DG Cervical Spine 2 or 3 views    Standing Status:   Future    Number of Occurrences:   1    Standing Expiration Date:   04/22/2024    Order Specific Question:   Reason for Exam (SYMPTOM  OR DIAGNOSIS REQUIRED)    Answer:   eval low back pain    Order Specific Question:   Is patient pregnant?    Answer:   No    Order Specific Question:   Preferred imaging location?    Answer:   [facility]   Meds ordered this encounter  Medications   predniSONE (DELTASONE) 50 MG tablet    Sig: Take 1 tablet (50 mg total) by mouth daily.    Dispense:  5 tablet    Refill:  0     Discussed warning signs or symptoms. Please see discharge instructions. Patient expresses understanding.   The above documentation has been reviewed and is accurate and complete Clementeen Graham, M.D.

## 2023-04-26 NOTE — Progress Notes (Unsigned)
Cardiology Office Note:    Date:  04/29/2023   ID:  Michele Turner, DOB Apr 09, 1976, MRN [id number]  PCP:  Michele Quill, PA-C  Cardiologist:  Michele Lemma, MD     Referring MD: Michele Quill, PA-C   Chief Complaint: routine follow-up of palpitations   History of Present Illness:    Michele Turner is a 47 y.o. 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 Herbie Turner and presents today for routine follow-up.  Patient has a history of recurrent left lower extremity DVT as well as phlebitis and thrombophlebitis. She was on anticoagulation with Coumadin for several years but has not had any evidence of DVT since 2014 so anticoagulation was stopped in 2017 and she was started on Aspirin. She was referred to Dr. Herbie Turner in 05/2021 for for further evaluation of rapid palpitations. Monitor showed sinus rhythm with rare PACs/ PVCs but no significant arrhythmias. Palpitations were felt to mostly likely by related to anxiety.   She was last seen by Michele Reining, NP, in 10/2021 at which time she reported recurrent palpations that had been occurring more frequently over the last month. She also described one near syncope episode that occurred after excessive alcohol consumption the night before. She was no longer on Adderall at that time. Echo and repeat monitor were ordered for further evaluation. Echo showed LVEF of 55-60% with normal wall motion, normal RV function, and no significant valvular disease. Monitor showed underlying sinus rhythm with one 4 beat run o NSVT and rare PACs/ PVCs but no significant arrhythmias.   Patient presents today for follow-up. She reports worsening of her palpitations particularly over the last months. She describes her palpitations as a "flutter" or  "wave" where her heart sells and then comes back down. They have been occurring more frequently - on a daily basis rather than once or twice a week. However, they are very brief and resolve by the time that she is able to pull up the EKG function on her Apple Watch. She does admit to drinking a lot of caffeine (she drinks one cup of coffee in the morning and than 3 shorts of expresso in the afternoon. She also admits to be under a lot of stress with her job. She is also currently on a Prednisone taper but states the palpitations started getting worse prior to starting the Prednisone. Otherwise, she is doing well from a cardiac standpoint. She describes some occasional mild lightheadedness/ dizziness with quick position changes but no falls or syncope. No chest pain, shortness of breath, orthopnea, PND, edema.   She is very concerned that these palpitations could have a negative effect on her heart. Palpitations sound like PACs/ PVCs. Reviewed prior monitors and Echo report.  EKGs/Labs/Other Studies Reviewed:    The following studies were reviewed:  Monitor 05/2021: 100% sinus rhythm: Heart rate ranged from 47 to 179 bpm. Average heart rate 83 bpm. (Confirmation that most tachycardic episode was during a vigorous workout session) Very rare <1%, 54) PACs noted with 6 couplets. Extremely rare PVCs noted No arrhythmias either tachycardic or bradycardic.-No atrial tachycardia, atrial fibrillation, atrial flutter, supraventricular tachycardia PACs noted to have different P wave morphology suggesting ectopic beats. _______________  Echocardiogram 11/19/2021: Impressions: 1. Left ventricular ejection fraction, by estimation, is 55 to 60%. The  left ventricle has normal function. The  left ventricle has no regional  wall motion abnormalities. Left ventricular diastolic parameters were  normal.   2. Right ventricular systolic function is normal. The right ventricular  size is normal. Tricuspid  regurgitation signal is inadequate for assessing  PA pressure.   3. The mitral valve is normal in structure. No evidence of mitral valve  regurgitation. No evidence of mitral stenosis.   4. The aortic valve was not well visualized. Aortic valve regurgitation  is not visualized. No aortic stenosis is present.   5. The inferior vena cava is dilated in size with >50% respiratory  variability, suggesting right atrial pressure of 8 mmHg.  _______________  Monitor 11/2021:   Overall heart rate range 40-163 bpm and average 89 bpm.  Heart rate of 40 bpm was 2:30 AM, 163 bpm at 3:56 PM   Rare PACs and PVCs.   1 run of 4 PVC beat -> by convention this is considered ventricular tachycardia, but benign as a single finding.  (This was identified by auto trigger), can   Manually detected events: Sinus rhythm with sinus tachycardia: Symptoms noted fluttering/skipping beats   Overall benign study.  No abnormal findings.  No sustained arrhythmias.  EKG:  EKG ordered today.   EKG Interpretation Date/Time:  Tuesday April 29 2023 15:51:22 EDT Ventricular Rate:  80 PR Interval:  140 QRS Duration:  80 QT Interval:  388 QTC Calculation: 447 R Axis:   61  Text Interpretation: Normal sinus rhythm Possible Left atrial enlargement No acute ST/T changes Confirmed by Marjie Skiff [phone]) on 04/29/2023 3:56:48 PM    Recent Labs: 07/15/2022: ALT 15; BUN 15; Creatinine 0.87; Hemoglobin 14.1; Platelet Count 331; Potassium 4.4; Sodium 139  Recent Lipid Panel    Component Value Date/Time   CHOL 246 (H) 08/13/2018 0839   TRIG 42.0 08/13/2018 0839   HDL 117.70 08/13/2018 0839   CHOLHDL 2 08/13/2018 0839   VLDL 8.4 08/13/2018 0839   LDLCALC 120 (H) 08/13/2018 0839   LDLDIRECT 89.4 09/03/2011 0846    Physical Exam:    Vital Signs: BP 120/70 (BP Location: Left Arm, Patient Position: Sitting, Cuff Size: Normal)   Pulse 80   Ht 5\' 4"  (1.626 m)   Wt 122 lb (55.3 kg)   SpO2 94%   BMI 20.94 kg/m      Wt Readings from Last 3 Encounters:  04/29/23 122 lb (55.3 kg)  04/23/23 122 lb (55.3 kg)  07/15/22 125 lb 6.4 oz (56.9 kg)     General: 47 y.o. Caucasian female in no acute distress. HEENT: Normocephalic and atraumatic. Sclera clear.  Neck: Supple. No carotid bruits. No JVD. Heart: RRR. Distinct S1 and S2. No murmurs, gallops, or rubs.  Lungs: No increased work of breathing. Clear to ausculation bilaterally. No wheezes, rhonchi, or rales.   Extremities: No lower extremity edema.  Skin: Warm and dry. Neuro: No focal deficits. Psych: Normal affect. Responds appropriately.   Assessment:    1. Rapid palpitations   2. Dyslipidemia   3. History of DVT (deep vein thrombosis)   4. Cardiovascular event risk     Plan:    Palpitations  Patient has a history of rapid palpitations. She has worn 2 monitors in the past that have been unremarkable. Last monitor in 11/2021 showed one 4 beat run of NSVT and rare PACs/ PVCs but no significant arrhythmias.  - She reports worsening palpitations over the last month. Symptoms sound like PACs/ PVCs.  - Did discuss ordering another monitor but explained that  I don't think it is going to show anything different based off patient's description of symptoms. Will hold off on this for now. - Recommended trying to limit caffeine to see if that helps. Stress also may be playing a role.  - If symptoms worsen, could consider adding a low dose beta-blocker.  Dyslipidemia NMR lipo profile in 01/2023: Total Cholesterol 248, Triglycerides 61, HDL 117, LDL 121. VLDL size was mildly high at 48.7.  - 10 year ASCVD risk = 0.4% which is considered low risk.  - Patient does not want to be on a statin and given her 10 year ASCVD risk is low, we can hold off on this at this time.   History of Recurrent DVT Patient has a history of recurrent left lower extremity DVT as well as phlebitis and thrombophlebitis. She was on anticoagulation with Coumadin for several years but  has not had any evidence of DVT since 2014 so anticoagulation was stopped in 2017. - Continue Aspirin 81mg  daily.   Risk Stratification Routine Screening Patient would like to have any available preventative screening done to assess for cardiovascular disease. Therefore, will order coronary calcium score vascular screening which includes evaluation for carotid stenosis, AAA, and PAD. Patient is aware that she will have to pay for these out of pocket and is agreeable to this. Of note, coronary calcium score will also help [place] determine how aggressive we need to be in treating her cholesterol.   Disposition: Follow up in 1 year.    Leanne Lovely, PA-C  04/29/2023 9:56 PM     [facility]

## 2023-04-29 ENCOUNTER — Ambulatory Visit: Payer: 59 | Attending: Student | Admitting: Student

## 2023-04-29 ENCOUNTER — Encounter: Payer: Self-pay | Admitting: Student

## 2023-04-29 VITALS — BP 120/70 | HR 80 | Ht 64.0 in | Wt 122.0 lb

## 2023-04-29 DIAGNOSIS — E785 Hyperlipidemia, unspecified: Secondary | ICD-10-CM | POA: Diagnosis not present

## 2023-04-29 DIAGNOSIS — R002 Palpitations: Secondary | ICD-10-CM

## 2023-04-29 DIAGNOSIS — Z9189 Other specified personal risk factors, not elsewhere classified: Secondary | ICD-10-CM | POA: Diagnosis not present

## 2023-04-29 DIAGNOSIS — Z86718 Personal history of other venous thrombosis and embolism: Secondary | ICD-10-CM

## 2023-04-29 NOTE — Patient Instructions (Signed)
Medication Instructions:  The current medical regimen is effective;  continue present plan and medications as directed. Please refer to the Current Medication list given to you today. *If you need a refill on your cardiac medications before your next appointment, please call your pharmacy*   Lab Work: NONE  Testing/Procedures: CALCIUM SCORE-SOMEONE WILL BE CALLING YOU TO SCHEDULE THIS VASCUSCREEN--SOMEONE WILL BE CALLING YOU TO SCHEDULE THIS  Follow-Up: At [facility], you and your health needs are our priority.  As part of our continuing mission to provide you with exceptional heart care, we have created designated Provider Care Teams.  These Care Teams include your primary Cardiologist (physician) and Advanced Practice Providers (APPs -  Physician Assistants and Nurse Practitioners) who all work together to provide you with the care you need, when you need it.  Your next appointment:   12 month(s)  Provider:   [name], MD  or Marjie Skiff, PA-C

## 2023-05-02 ENCOUNTER — Telehealth: Payer: Self-pay | Admitting: Physical Medicine and Rehabilitation

## 2023-05-02 ENCOUNTER — Encounter: Payer: Self-pay | Admitting: Family Medicine

## 2023-05-02 DIAGNOSIS — M5412 Radiculopathy, cervical region: Secondary | ICD-10-CM

## 2023-05-02 NOTE — Telephone Encounter (Signed)
Patient called to schedule an appointment for a pinched nerve. CB#[phone]

## 2023-05-05 ENCOUNTER — Encounter: Payer: Self-pay | Admitting: Family Medicine

## 2023-05-05 ENCOUNTER — Telehealth: Payer: Self-pay | Admitting: Physical Medicine and Rehabilitation

## 2023-05-05 NOTE — Telephone Encounter (Signed)
Spoke with patient and scheduled OV for 05/08/23.

## 2023-05-05 NOTE — Telephone Encounter (Signed)
Patient called needing to schedule an appointment with Dr. Alvester Morin. The number to contact patient is [phone]

## 2023-05-05 NOTE — Telephone Encounter (Signed)
See previous encounter

## 2023-05-05 NOTE — Telephone Encounter (Signed)
Forwarding to Dr. Corey to review and advise.  

## 2023-05-07 ENCOUNTER — Ambulatory Visit (HOSPITAL_BASED_OUTPATIENT_CLINIC_OR_DEPARTMENT_OTHER)
Admission: RE | Admit: 2023-05-07 | Discharge: 2023-05-07 | Disposition: A | Discharge status: Home / Self Care | Payer: 59 | Source: Ambulatory Visit | Attending: Student | Admitting: Student

## 2023-05-07 ENCOUNTER — Inpatient Hospital Stay
Admission: RE | Admit: 2023-05-07 | Discharge: 2023-05-07 | Discharge status: Home / Self Care | Payer: 59 | Source: Ambulatory Visit | Attending: Family Medicine | Admitting: Family Medicine

## 2023-05-07 DIAGNOSIS — M5412 Radiculopathy, cervical region: Secondary | ICD-10-CM

## 2023-05-07 DIAGNOSIS — Z9189 Other specified personal risk factors, not elsewhere classified: Secondary | ICD-10-CM | POA: Insufficient documentation

## 2023-05-08 ENCOUNTER — Ambulatory Visit: Payer: 59 | Admitting: Physical Medicine and Rehabilitation

## 2023-05-08 ENCOUNTER — Encounter: Payer: Self-pay | Admitting: Physical Medicine and Rehabilitation

## 2023-05-08 DIAGNOSIS — M5412 Radiculopathy, cervical region: Secondary | ICD-10-CM

## 2023-05-08 DIAGNOSIS — M546 Pain in thoracic spine: Secondary | ICD-10-CM

## 2023-05-08 DIAGNOSIS — M7918 Myalgia, other site: Secondary | ICD-10-CM | POA: Diagnosis not present

## 2023-05-08 MED ORDER — TRIAZOLAM 0.25 MG PO TABS: ORAL_TABLET | ORAL | 0 refills | Status: DC
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
## 2023-05-08 NOTE — Progress Notes (Signed)
Sept 9th she went bowling using a 6lb ball.  Every time she would turn and look up to the right she would get a shooting pain in her subscapular.   She goes and gets dry needling no relief.  Pain has gotten worse, she feels like she has weakness in her R arm and now has tingling feeling in her R leg.

## 2023-05-09 ENCOUNTER — Telehealth: Payer: Self-pay | Admitting: Physical Medicine and Rehabilitation

## 2023-05-09 ENCOUNTER — Telehealth: Payer: Self-pay | Admitting: Family Medicine

## 2023-05-09 NOTE — Telephone Encounter (Signed)
Reviewed MRI.

## 2023-05-09 NOTE — Progress Notes (Signed)
Cervical spine MRI shows a pinched nerve at C6-7.  Neck step would typically be an epidural steroid injection.  I see that you saw [facility] yesterday for this.  It looks like they are ordering the injection.  If there is a significant delay please let me know.

## 2023-05-09 NOTE — Telephone Encounter (Signed)
Patient called returning your call.  CB#[phone]

## 2023-05-09 NOTE — Progress Notes (Signed)
Cervical spine x-ray shows a little bit of arthritis.

## 2023-05-13 ENCOUNTER — Telehealth: Payer: Self-pay | Admitting: Physical Medicine and Rehabilitation

## 2023-05-13 NOTE — Telephone Encounter (Signed)
If you are starting to get weakness in that arm that would be an indication to have a surgical consultation to relieve the compression of the nerve.  Typically epidural steroid injections do a pretty good job of that and get you feeling better without needing surgery.  When is the injection scheduled?

## 2023-05-13 NOTE — Telephone Encounter (Signed)
Patient called needing to schedule for injection in the neck. CB#[phone]

## 2023-05-19 ENCOUNTER — Telehealth: Payer: Self-pay | Admitting: Physical Medicine and Rehabilitation

## 2023-05-19 ENCOUNTER — Ambulatory Visit (HOSPITAL_COMMUNITY): Admission: RE | Admit: 2023-05-19 | Payer: 59 | Source: Ambulatory Visit | Attending: Student | Admitting: Student

## 2023-05-19 NOTE — Telephone Encounter (Signed)
Patient returned call asked for a call back. The number to contact patient is [phone]

## 2023-05-30 ENCOUNTER — Ambulatory Visit (HOSPITAL_COMMUNITY): Payer: 59

## 2023-06-02 ENCOUNTER — Ambulatory Visit: Payer: 59 | Admitting: Physical Medicine and Rehabilitation

## 2023-06-02 ENCOUNTER — Other Ambulatory Visit: Payer: Self-pay

## 2023-06-02 DIAGNOSIS — M5412 Radiculopathy, cervical region: Secondary | ICD-10-CM

## 2023-06-02 MED ORDER — METHYLPREDNISOLONE ACETATE 40 MG/ML IJ SUSP
40.0000 mg | Freq: Once | INTRAMUSCULAR | Status: AC
  Administered 2023-06-02: 40 mg

## 2023-06-02 NOTE — Progress Notes (Signed)
Functional Pain Scale - descriptive words and definitions  Mild (2)   Noticeable when not distracted/no impact on ADL's/sleep only slightly affected and able to   use both passive and active distraction for comfort. Mild range order  Average Pain 2 108/69   +Driver, -BT, -Dye Allergies.

## 2023-06-02 NOTE — Procedures (Signed)
Cervical Epidural Steroid Injection - Interlaminar Approach with Fluoroscopic Guidance  Patient: Michele Turner      Date of Birth: Jan 08, 1976 MRN: [id number] PCP: [name], PA-C      Visit Date: 06/02/2023   Universal Protocol:    Date/Time: 11/25/248:22 AM  Consent Given By: the patient  Position: PRONE  Additional Comments: Vital signs were monitored before and after the procedure. Patient was prepped and draped in the usual sterile fashion. The correct patient, procedure, and site was verified.   Injection Procedure Details:   Procedure diagnoses: Radiculopathy, cervical region [M54.12]    Meds Administered:  Meds ordered this encounter  Medications   methylPREDNISolone acetate (DEPO-MEDROL) injection 40 mg     Laterality: Right  Location/Site: C7-T1  Needle: 3.5 in., 20 ga. Tuohy  Needle Placement: Paramedian epidural space  Findings:  -Comments: Excellent flow of contrast into the epidural space.  Procedure Details: Using a paramedian approach from the side mentioned above, the region overlying the inferior lamina was localized under fluoroscopic visualization and the soft tissues overlying this structure were infiltrated with 4 ml. of 1% Lidocaine without Epinephrine. A # 20 gauge, Tuohy needle was inserted into the epidural space using a paramedian approach.  The epidural space was localized using loss of resistance along with contralateral oblique bi-planar fluoroscopic views.  After negative aspirate for air, blood, and CSF, a 2 ml. volume of Isovue-250 was injected into the epidural space and the flow of contrast was observed. Radiographs were obtained for documentation purposes.   The injectate was administered into the level noted above.  Additional Comments:  The patient tolerated the procedure well Dressing: 2 x 2 sterile gauze and Band-Aid    Post-procedure details: Patient was observed during the procedure. Post-procedure  instructions were reviewed.  Patient left the clinic in stable condition.

## 2023-06-02 NOTE — Patient Instructions (Signed)

## 2023-06-02 NOTE — Progress Notes (Signed)
Michele Turner - 47 y.o. female MRN [id number]  Date of birth: 04-Jul-1976  Office Visit Note: Visit Date: 06/02/2023 PCP: [name], PA-C Referred by: [name], PA-C  Subjective: Chief Complaint  Patient presents with   Neck - Pain   HPI:  Michele Turner is a 47 y.o. female who comes in today at the request of Ellin Goodie, FNP and Dr. Clementeen Graham for planned Right C7-T1 Cervical Interlaminar epidural steroid injection with fluoroscopic guidance.  The patient has failed conservative care including home exercise, medications, time and activity modification.  This injection will be diagnostic and hopefully therapeutic.  Please see requesting physician notes for further details and justification. Consider right C5-6 and C6-7 facet block.   ROS Otherwise per HPI.  Assessment & Plan: Visit Diagnoses:    ICD-10-CM   1. Radiculopathy, cervical region  M54.12 methylPREDNISolone acetate (DEPO-MEDROL) injection 40 mg    XR C-ARM NO REPORT    Epidural Steroid injection      Plan: No additional findings.   Meds & Orders:  Meds ordered this encounter  Medications   methylPREDNISolone acetate (DEPO-MEDROL) injection 40 mg    Orders Placed This Encounter  Procedures   XR C-ARM NO REPORT   Epidural Steroid injection    Follow-up: Return for visit to requesting provider as needed.   Procedures: No procedures performed  Cervical Epidural Steroid Injection - Interlaminar Approach with Fluoroscopic Guidance  Patient: Michele Turner      Date of Birth: 12-15-1975 MRN: [id number] PCP: [name], PA-C      Visit Date: 06/02/2023   Universal Protocol:    Date/Time: 11/25/248:22 AM  Consent Given By: the patient  Position: PRONE  Additional Comments: Vital signs were monitored before and after the procedure. Patient was prepped and draped in the usual sterile fashion. The correct patient, procedure, and site was  verified.   Injection Procedure Details:   Procedure diagnoses: Radiculopathy, cervical region [M54.12]    Meds Administered:  Meds ordered this encounter  Medications   methylPREDNISolone acetate (DEPO-MEDROL) injection 40 mg     Laterality: Right  Location/Site: C7-T1  Needle: 3.5 in., 20 ga. Tuohy  Needle Placement: Paramedian epidural space  Findings:  -Comments: Excellent flow of contrast into the epidural space.  Procedure Details: Using a paramedian approach from the side mentioned above, the region overlying the inferior lamina was localized under fluoroscopic visualization and the soft tissues overlying this structure were infiltrated with 4 ml. of 1% Lidocaine without Epinephrine. A # 20 gauge, Tuohy needle was inserted into the epidural space using a paramedian approach.  The epidural space was localized using loss of resistance along with contralateral oblique bi-planar fluoroscopic views.  After negative aspirate for air, blood, and CSF, a 2 ml. volume of Isovue-250 was injected into the epidural space and the flow of contrast was observed. Radiographs were obtained for documentation purposes.   The injectate was administered into the level noted above.  Additional Comments:  The patient tolerated the procedure well Dressing: 2 x 2 sterile gauze and Band-Aid    Post-procedure details: Patient was observed during the procedure. Post-procedure instructions were reviewed.  Patient left the clinic in stable condition.   Clinical History: MRI CERVICAL SPINE WITHOUT CONTRAST   TECHNIQUE: Multiplanar, multisequence MR imaging of the cervical spine was performed. No intravenous contrast was administered.   COMPARISON:  Cervical spine radiographs 04/23/2023.   FINDINGS: Alignment: Normal.   Vertebrae: Normal vertebral body heights.  Mild degenerative endplate changes at multiple levels, greatest at C5-6. No suspicious marrow lesions.   Cord: Normal spinal  cord signal and volume.   Posterior Fossa, vertebral arteries, paraspinal tissues: Unremarkable.   Disc levels:   C2-C3:  Normal.   C3-C4: Small disc bulge and mild bilateral facet arthropathy. No spinal canal stenosis or neural foraminal narrowing.   C4-C5: Small, right eccentric disc bulge and mild bilateral facet arthropathy. No spinal canal stenosis or neural foraminal narrowing.   C5-C6: Disc bulge results in mild spinal canal stenosis. Right-greater-than-left facet arthropathy and uncovertebral joint spurring results in moderate right neural foraminal narrowing.   C6-C7: Right eccentric disc bulge results in mild spinal canal stenosis. Right-greater-than-left facet arthropathy and uncovertebral joint spurring results in severe right neural foraminal narrowing.   C7-T1: Disc bulge and right-greater-than-left facet arthropathy results in moderate right neural foraminal narrowing.   IMPRESSION: 1. Multilevel cervical spondylosis, worst at C6-7 where there is mild spinal canal stenosis and severe right neural foraminal narrowing. 2. Mild spinal canal stenosis at C5-6. 3. Moderate right neural foraminal narrowing at C5-6 and C7-T1.     Electronically Signed   By: Orvan Falconer M.D.   On: 05/08/2023 08:29     Objective:  VS:  HT:    WT:   BMI:     BP:   HR: bpm  TEMP: ( )  RESP:  Physical Exam Vitals and nursing note reviewed.  Constitutional:      General: She is not in acute distress.    Appearance: Normal appearance. She is not ill-appearing.  HENT:     Head: Normocephalic and atraumatic.     Right Ear: External ear normal.     Left Ear: External ear normal.  Eyes:     Extraocular Movements: Extraocular movements intact.  Cardiovascular:     Rate and Rhythm: Normal rate.     Pulses: Normal pulses.  Musculoskeletal:     Cervical back: Tenderness present. No rigidity.     Right lower leg: No edema.     Left lower leg: No edema.     Comments:  Patient has good strength in the upper extremities including 5 out of 5 strength in wrist extension long finger flexion and APB.  There is no atrophy of the hands intrinsically.  There is a negative Hoffmann's test.   Lymphadenopathy:     Cervical: No cervical adenopathy.  Skin:    Findings: No erythema, lesion or rash.  Neurological:     General: No focal deficit present.     Mental Status: She is alert and oriented to person, place, and time.     Sensory: No sensory deficit.     Motor: No weakness or abnormal muscle tone.     Coordination: Coordination normal.  Psychiatric:        Mood and Affect: Mood normal.        Behavior: Behavior normal.      Imaging: XR C-ARM NO REPORT  Result Date: 06/02/2023 Please see Notes tab for imaging impression.

## 2023-06-20 ENCOUNTER — Ambulatory Visit (HOSPITAL_COMMUNITY)
Admission: RE | Admit: 2023-06-20 | Discharge: 2023-06-20 | Disposition: A | Discharge status: Home / Self Care | Payer: 59 | Source: Ambulatory Visit | Attending: Student | Admitting: Student

## 2023-06-20 DIAGNOSIS — Z9189 Other specified personal risk factors, not elsewhere classified: Secondary | ICD-10-CM

## 2023-06-22 ENCOUNTER — Encounter: Payer: Self-pay | Admitting: Physical Medicine and Rehabilitation

## 2023-06-23 ENCOUNTER — Other Ambulatory Visit: Payer: Self-pay | Admitting: Physical Medicine and Rehabilitation

## 2023-06-23 DIAGNOSIS — M47812 Spondylosis without myelopathy or radiculopathy, cervical region: Secondary | ICD-10-CM

## 2023-06-23 DIAGNOSIS — M542 Cervicalgia: Secondary | ICD-10-CM

## 2023-07-15 ENCOUNTER — Ambulatory Visit: Payer: 59 | Admitting: Physical Medicine and Rehabilitation

## 2023-07-15 ENCOUNTER — Other Ambulatory Visit: Payer: Self-pay

## 2023-07-15 DIAGNOSIS — M47812 Spondylosis without myelopathy or radiculopathy, cervical region: Secondary | ICD-10-CM

## 2023-07-15 MED ORDER — METHYLPREDNISOLONE ACETATE 40 MG/ML IJ SUSP
40.0000 mg | Freq: Once | INTRAMUSCULAR | Status: AC
  Administered 2023-07-15: 40 mg

## 2023-07-16 NOTE — Progress Notes (Signed)
 Michele Turner - 48 y.o. female MRN [id number]  Date of birth: 1975/09/25  Office Visit Note: Visit Date: 07/15/2023 PCP: Dorise Toribio PARAS, PA-C Referred by: Dorise Toribio PARAS, PA-C  Subjective: Chief Complaint  Patient presents with   Neck - Pain   HPI:  Michele Turner is a 48 y.o. female who comes in today at the request of Duwaine Pouch, FNP for planned Right  C5-6 and C6-7 Cervical facet/medial branch block with fluoroscopic guidance.  The patient has failed conservative care including home exercise, medications, time and activity modification.  This injection will be diagnostic and hopefully therapeutic.  Please see requesting physician notes for further details and justification.  Exam has shown concordant pain with facet joint loading. Continues with comprehensive PT with Norvel Piedra and dry needling.   ROS Otherwise per HPI.  Assessment & Plan: Visit Diagnoses:    ICD-10-CM   1. Facet arthropathy, cervical  M47.812 XR C-ARM NO REPORT    Facet Injection    methylPREDNISolone  acetate (DEPO-MEDROL ) injection 40 mg    2. Cervical spondylosis without myelopathy  M47.812       Plan: No additional findings.   Meds & Orders:  Meds ordered this encounter  Medications   methylPREDNISolone  acetate (DEPO-MEDROL ) injection 40 mg    Orders Placed This Encounter  Procedures   Facet Injection   XR C-ARM NO REPORT    Follow-up: Return for visit to requesting provider as needed.   Procedures: No procedures performed  Cervical Facet Joint Intra-Articular Injection with Fluoroscopic Guidance  Patient: Michele Turner      Date of Birth: March 12, 1976 MRN: [id number] PCP: Dorise Toribio PARAS, PA-C      Visit Date: 07/15/2023   Universal Protocol:    Date/Time: 07/15/2508:24 AM  Consent Given By: the patient  Position: PRONE  Additional Comments: Vital signs were monitored before and after the procedure. Patient was prepped and draped in  the usual sterile fashion. The correct patient, procedure, and site was verified.   Injection Procedure Details:  Procedure Site One Meds Administered:  Meds ordered this encounter  Medications   methylPREDNISolone  acetate (DEPO-MEDROL ) injection 40 mg     Laterality: Right  Location/Site:  C5-6 C6-7  Needle size: 25 G  Needle type: Spinal  Needle Placement: Articular  Findings:  -Contrast Used: 0.5 mL iohexol 180 mg iodine/mL   -Comments: Excellent flow of contrast producing a partial arthrogram.  Procedure Details: The region overlying the facet joints mentioned above were localized under fluoroscopic visualization. The needle was inserted down to the level of the lateral mass of the superior articular process of the facet joint to be injected. Then, the needle was walked off inferiorly into the lateral aspect of the facet joint. Bi-planar images were used for confirming placement and spot radiographs were documented.  A 0.25 ml volume of Omnipaque-240 was injected into the facet joint and a standard partial arthrogram was obtained. Radiographs were obtained of the arthrogram. A 0.5 ml. volume of the steroid/anesthetic solution was injected into the joint. This procedure was repeated for each facet joint injected.   Additional Comments:  No complications occurred Dressing: Band-Aid    Post-procedure details: Patient was observed during the procedure. Post-procedure instructions were reviewed.  Patient left the clinic in stable condition.    Clinical History: MRI CERVICAL SPINE WITHOUT CONTRAST   TECHNIQUE: Multiplanar, multisequence MR imaging of the cervical spine was performed. No intravenous contrast was administered.   COMPARISON:  Cervical spine radiographs  04/23/2023.   FINDINGS: Alignment: Normal.   Vertebrae: Normal vertebral body heights. Mild degenerative endplate changes at multiple levels, greatest at C5-6. No suspicious marrow lesions.    Cord: Normal spinal cord signal and volume.   Posterior Fossa, vertebral arteries, paraspinal tissues: Unremarkable.   Disc levels:   C2-C3:  Normal.   C3-C4: Small disc bulge and mild bilateral facet arthropathy. No spinal canal stenosis or neural foraminal narrowing.   C4-C5: Small, right eccentric disc bulge and mild bilateral facet arthropathy. No spinal canal stenosis or neural foraminal narrowing.   C5-C6: Disc bulge results in mild spinal canal stenosis. Right-greater-than-left facet arthropathy and uncovertebral joint spurring results in moderate right neural foraminal narrowing.   C6-C7: Right eccentric disc bulge results in mild spinal canal stenosis. Right-greater-than-left facet arthropathy and uncovertebral joint spurring results in severe right neural foraminal narrowing.   C7-T1: Disc bulge and right-greater-than-left facet arthropathy results in moderate right neural foraminal narrowing.   IMPRESSION: 1. Multilevel cervical spondylosis, worst at C6-7 where there is mild spinal canal stenosis and severe right neural foraminal narrowing. 2. Mild spinal canal stenosis at C5-6. 3. Moderate right neural foraminal narrowing at C5-6 and C7-T1.     Electronically Signed   By: Ryan Chess M.D.   On: 05/08/2023 08:29     Objective:  VS:  HT:    WT:   BMI:     BP:   HR: bpm  TEMP: ( )  RESP:  Physical Exam Vitals and nursing note reviewed.  Constitutional:      General: She is not in acute distress.    Appearance: Normal appearance. She is not ill-appearing.  HENT:     Head: Normocephalic and atraumatic.     Right Ear: External ear normal.     Left Ear: External ear normal.  Eyes:     Extraocular Movements: Extraocular movements intact.  Cardiovascular:     Rate and Rhythm: Normal rate.     Pulses: Normal pulses.  Musculoskeletal:     Cervical back: Tenderness present. No rigidity.     Right lower leg: No edema.     Left lower leg: No edema.      Comments: Patient has good strength in the upper extremities including 5 out of 5 strength in wrist extension long finger flexion and APB.  There is no atrophy of the hands intrinsically.  There is a negative Hoffmann's test.   Lymphadenopathy:     Cervical: No cervical adenopathy.  Skin:    Findings: No erythema, lesion or rash.  Neurological:     General: No focal deficit present.     Mental Status: She is alert and oriented to person, place, and time.     Sensory: No sensory deficit.     Motor: No weakness or abnormal muscle tone.     Coordination: Coordination normal.  Psychiatric:        Mood and Affect: Mood normal.        Behavior: Behavior normal.      Imaging: XR C-ARM NO REPORT Result Date: 07/15/2023 Please see Notes tab for imaging impression.

## 2023-07-16 NOTE — Procedures (Signed)
 Cervical Facet Joint Intra-Articular Injection with Fluoroscopic Guidance  Patient: Michele Turner      Date of Birth: 04-22-1976 MRN: [id number] PCP: Dorise Toribio PARAS, PA-C      Visit Date: 07/15/2023   Universal Protocol:    Date/Time: 07/15/2508:24 AM  Consent Given By: the patient  Position: PRONE  Additional Comments: Vital signs were monitored before and after the procedure. Patient was prepped and draped in the usual sterile fashion. The correct patient, procedure, and site was verified.   Injection Procedure Details:  Procedure Site One Meds Administered:  Meds ordered this encounter  Medications   methylPREDNISolone  acetate (DEPO-MEDROL ) injection 40 mg     Laterality: Right  Location/Site:  C5-6 C6-7  Needle size: 25 G  Needle type: Spinal  Needle Placement: Articular  Findings:  -Contrast Used: 0.5 mL iohexol 180 mg iodine/mL   -Comments: Excellent flow of contrast producing a partial arthrogram.  Procedure Details: The region overlying the facet joints mentioned above were localized under fluoroscopic visualization. The needle was inserted down to the level of the lateral mass of the superior articular process of the facet joint to be injected. Then, the needle was walked off inferiorly into the lateral aspect of the facet joint. Bi-planar images were used for confirming placement and spot radiographs were documented.  A 0.25 ml volume of Omnipaque-240 was injected into the facet joint and a standard partial arthrogram was obtained. Radiographs were obtained of the arthrogram. A 0.5 ml. volume of the steroid/anesthetic solution was injected into the joint. This procedure was repeated for each facet joint injected.   Additional Comments:  No complications occurred Dressing: Band-Aid    Post-procedure details: Patient was observed during the procedure. Post-procedure instructions were reviewed.  Patient left the clinic in stable  condition.

## 2023-07-19 ENCOUNTER — Encounter: Payer: Self-pay | Admitting: Physical Medicine and Rehabilitation

## 2023-07-23 ENCOUNTER — Encounter: Payer: Self-pay | Admitting: Physical Medicine and Rehabilitation

## 2023-07-23 ENCOUNTER — Ambulatory Visit: Payer: 59 | Admitting: Physical Medicine and Rehabilitation

## 2023-07-23 DIAGNOSIS — M7918 Myalgia, other site: Secondary | ICD-10-CM

## 2023-07-23 DIAGNOSIS — M79621 Pain in right upper arm: Secondary | ICD-10-CM | POA: Diagnosis not present

## 2023-07-23 DIAGNOSIS — M546 Pain in thoracic spine: Secondary | ICD-10-CM

## 2023-07-23 DIAGNOSIS — G8929 Other chronic pain: Secondary | ICD-10-CM | POA: Diagnosis not present
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
## 2023-07-24 ENCOUNTER — Ambulatory Visit
Admission: RE | Admit: 2023-07-24 | Discharge: 2023-07-24 | Disposition: A | Discharge status: Home / Self Care | Payer: 59 | Source: Ambulatory Visit | Attending: Physical Medicine and Rehabilitation | Admitting: Physical Medicine and Rehabilitation

## 2023-07-24 DIAGNOSIS — M79621 Pain in right upper arm: Secondary | ICD-10-CM

## 2023-07-24 DIAGNOSIS — G8929 Other chronic pain: Secondary | ICD-10-CM

## 2023-07-24 DIAGNOSIS — M7918 Myalgia, other site: Secondary | ICD-10-CM

## 2023-08-12 ENCOUNTER — Other Ambulatory Visit: Payer: Self-pay | Admitting: Neurosurgery

## 2023-08-12 DIAGNOSIS — M5412 Radiculopathy, cervical region: Secondary | ICD-10-CM

## 2023-08-12 DIAGNOSIS — M5414 Radiculopathy, thoracic region: Secondary | ICD-10-CM

## 2023-08-18 ENCOUNTER — Encounter: Payer: Self-pay | Admitting: Obstetrics & Gynecology

## 2023-08-21 NOTE — Discharge Instructions (Signed)

## 2023-08-22 ENCOUNTER — Ambulatory Visit
Admission: RE | Admit: 2023-08-22 | Discharge: 2023-08-22 | Disposition: A | Discharge status: Home / Self Care | Payer: 59 | Source: Ambulatory Visit | Attending: Neurosurgery | Admitting: Neurosurgery

## 2023-08-22 DIAGNOSIS — M5412 Radiculopathy, cervical region: Secondary | ICD-10-CM

## 2023-08-22 DIAGNOSIS — M5414 Radiculopathy, thoracic region: Secondary | ICD-10-CM

## 2023-08-22 MED ORDER — ONDANSETRON HCL 4 MG/2ML IJ SOLN: 4.0000 mg | Freq: Once | INTRAMUSCULAR | Status: DC | PRN

## 2023-08-22 MED ORDER — IOPAMIDOL (ISOVUE-M 300) INJECTION 61%
10.0000 mL | Freq: Once | INTRAMUSCULAR | Status: AC
  Administered 2023-08-22: 10 mL via INTRATHECAL

## 2023-08-22 MED ORDER — MEPERIDINE HCL 50 MG/ML IJ SOLN: 50.0000 mg | Freq: Once | INTRAMUSCULAR | Status: DC | PRN

## 2023-08-22 MED ORDER — DIAZEPAM 5 MG PO TABS
10.0000 mg | ORAL_TABLET | Freq: Once | ORAL | Status: AC
Start: 2023-08-22 — End: 2023-08-22
  Administered 2023-08-22: 10 mg via ORAL

## 2023-09-11 ENCOUNTER — Ambulatory Visit: Payer: 59 | Admitting: Dermatology

## 2023-09-11 ENCOUNTER — Encounter: Payer: Self-pay | Admitting: Dermatology

## 2023-09-11 VITALS — BP 133/91

## 2023-09-11 DIAGNOSIS — L719 Rosacea, unspecified: Secondary | ICD-10-CM | POA: Diagnosis not present

## 2023-09-11 DIAGNOSIS — L811 Chloasma: Secondary | ICD-10-CM | POA: Diagnosis not present

## 2023-09-11 DIAGNOSIS — R203 Hyperesthesia: Secondary | ICD-10-CM

## 2023-09-11 DIAGNOSIS — L219 Seborrheic dermatitis, unspecified: Secondary | ICD-10-CM

## 2023-09-11 MED ORDER — ALTRENO 0.05 % EX LOTN: TOPICAL_LOTION | CUTANEOUS | 0 refills | Status: AC

## 2023-09-11 NOTE — Progress Notes (Signed)
   New Patient Visit   Subjective  Michele Turner is a 48 y.o. female who presents for the following: New Pt - Rosacea  Patient states she has redness located at the foreheads and cheeks that she would like to have examined. Patient reports the areas have been there for 2.5 months. She reports the areas are bothersome at times.Patient rates irritation 6 out of 10. She states that the areas have not spread. Patient reports she has not previously been treated for these areas. Patient denied Hx of bx. Patient denied family history of skin cancer(s).   The following portions of the chart were reviewed this encounter and updated as appropriate: medications, allergies, medical history  Review of Systems:  No other skin or systemic complaints except as noted in HPI or Assessment and Plan.  Objective  Well appearing patient in no apparent distress; mood and affect are within normal limits.   A focused examination was performed of the following areas: scalp   Relevant exam findings are noted in the Assessment and Plan.    Assessment & Plan   1. Rosacea - Assessment: Mild rosacea with facial redness, particularly on the chin, and visible broken blood vessels. Condition exacerbated by stress, showering, and possibly driving. Patient experiences flushing, including ear involvement. Using azelaic acid with some improvement. No evidence of skin thickening. - Plan:    Continue use of azelaic acid (Nutrium emulsion)    Consider Altreno (tretinoin lotion) for retinoid treatment, starting with once weekly application and gradually increasing frequency as tolerated    Maintain current skincare regimen: azelaic acid, hyaluronic acid (Skin Quench), dimethicone-based balm, and sunscreen    Avoid known triggers such as saunas and limit coffee consumption if it causes flushing    Consider consultation with a cosmetic dermatologist for laser treatment options (e.g., V-Beam or Q-switch-NDAG  laser) for broken blood vessels if desired    Follow up in 6 months    Schedule a skin cancer screening  2. Melasma - Assessment: History of melasma, currently not actively treated. Small melasma patch observed during examination. Patient is diligent about sun protection, using sunscreen with at least 20% titanium dioxide. - Plan:    Continue strict sun protection measures, including high-SPF sunscreen with titanium dioxide    Consider tinted sunscreens for additional coverage and cosmetic improvement    Educate on the importance of consistent sun protection to prevent melasma progression  3. Seborrheic Dermatitis - Assessment: Seborrheic dermatitis affecting the scalp. Currently using ketoconazole for treatment and finding Dermazin effective for managing symptoms. - Plan:    Continue ketoconazole for scalp treatment    Maintain use of Dermazin as needed for symptom management    Monitor for any changes or worsening of condition  4. Sensitive Skin - Assessment: Sensitivity to retinol and vitamin C products, experiencing red, itchy patches with use. Occasional dry skin reported. - Plan:    Introduce Altreno (tretinoin lotion) cautiously, following the prescribed gradual increase in frequency    Maintain use of gentle skincare products suitable for sensitive skin    Consider hyaluronic acid products for hydration    Avoid known irritants and potential allergens in skincare products       No follow-ups on file.    Documentation: I have reviewed the above documentation for accuracy and completeness, and I agree with the above.  I, Shirron Marcha Solders, CMA, am acting as scribe for [employer], DO.   Langston Reusing, DO

## 2023-09-11 NOTE — Patient Instructions (Signed)
 Hello Foye Clock,  Thank you for visiting my office today.  Below is a summary of our discussion along with the essential instructions for your care:   Rosacea and Melasma Care:   Azelaic Acid: Continue applying azelaic acid (Nutrium emulsion) to address pigment and acne bumps.   Hydration and Protection: Apply Skin Quench (hyaluronic acid), followed by a balm from [employer], and ensure to use sunscreen.   Retinol Use: Incorporate a mild retinol during the night, but be vigilant for any signs of irritation.   Altreno: Consider trying Altreno, a gentle retinoid. Initiate with once a week application for a month, then escalate as tolerated.   Sunscreen: Diligently use sunscreen. For cost management, consider consulting a special pharmacy.  Seborrheic Dermatitis: For flare-ups on your forehead, continue the use of Dermazin.  Lifestyle Adjustments:   Be mindful of rosacea triggers such as stress, coffee, and saunas.   Ensure proper skincare routines post-shower and on makeup-free days.  Follow-Up: Please schedule a follow-up appointment in 6 months. At that time, we may also consider conducting a skin cancer screening.  Should you have any questions or concerns before our next meeting, do not hesitate to reach out to our office.  Warm regards,  Dr. [facility] Reusing [facility]   Important Information  Due to recent changes in healthcare laws, you may see results of your pathology and/or laboratory studies on MyChart before the doctors have had a chance to review them. We understand that in some cases there may be results that are confusing or concerning to you. Please understand that not all results are received at the same time and often the doctors may need to interpret multiple results in order to provide you with the best plan of care or course of treatment. Therefore, we ask that you please give [place] 2 business days to thoroughly review all your results before contacting the office for  clarification. Should we see a critical lab result, you will be contacted sooner.   If You Need Anything After Your Visit  If you have any questions or concerns for your doctor, please call our main line at [phone] If no one answers, please leave a voicemail as directed and we will return your call as soon as possible. Messages left after 4 pm will be answered the following business day.   You may also send [place] a message via MyChart. We typically respond to MyChart messages within 1-2 business days.  For prescription refills, please ask your pharmacy to contact our office. Our fax number is [phone].  If you have an urgent issue when the clinic is closed that cannot wait until the next business day, you can page your doctor at the number below.    Please note that while we do our best to be available for urgent issues outside of office hours, we are not available 24/7.   If you have an urgent issue and are unable to reach [place], you may choose to seek medical care at your doctor's office, retail clinic, urgent care center, or emergency room.  If you have a medical emergency, please immediately call 911 or go to the emergency department. In the event of inclement weather, please call our main line at [phone] for an update on the status of any delays or closures.  Dermatology Medication Tips: Please keep the boxes that topical medications come in in order to help keep track of the instructions about where and how to use these. Pharmacies typically print the medication instructions  only on the boxes and not directly on the medication tubes.   If your medication is too expensive, please contact our office at [phone] or send [place] a message through MyChart.   We are unable to tell what your co-pay for medications will be in advance as this is different depending on your insurance coverage. However, we may be able to find a substitute medication at lower cost or fill out paperwork to  get insurance to cover a needed medication.   If a prior authorization is required to get your medication covered by your insurance company, please allow [place] 1-2 business days to complete this process.  Drug prices often vary depending on where the prescription is filled and some pharmacies may offer cheaper prices.  The website www.goodrx.com contains coupons for medications through different pharmacies. The prices here do not account for what the cost may be with help from insurance (it may be cheaper with your insurance), but the website can give you the price if you did not use any insurance.  - You can print the associated coupon and take it with your prescription to the pharmacy.  - You may also stop by our office during regular business hours and pick up a GoodRx coupon card.  - If you need your prescription sent electronically to a different pharmacy, notify our office through [facility] or by phone at [phone]

## 2023-10-31 ENCOUNTER — Ambulatory Visit: Payer: 59

## 2023-11-17 ENCOUNTER — Encounter (HOSPITAL_BASED_OUTPATIENT_CLINIC_OR_DEPARTMENT_OTHER): Payer: Self-pay | Admitting: Radiology

## 2023-11-17 ENCOUNTER — Other Ambulatory Visit (HOSPITAL_BASED_OUTPATIENT_CLINIC_OR_DEPARTMENT_OTHER): Payer: Self-pay | Admitting: Family Medicine

## 2023-11-17 ENCOUNTER — Ambulatory Visit (HOSPITAL_BASED_OUTPATIENT_CLINIC_OR_DEPARTMENT_OTHER)
Admission: RE | Admit: 2023-11-17 | Discharge: 2023-11-17 | Disposition: A | Discharge status: Home / Self Care | Source: Ambulatory Visit | Attending: Family Medicine | Admitting: Family Medicine

## 2023-11-17 DIAGNOSIS — Z1231 Encounter for screening mammogram for malignant neoplasm of breast: Secondary | ICD-10-CM

## 2024-03-16 ENCOUNTER — Ambulatory Visit: Admitting: Dermatology

## 2024-03-18 ENCOUNTER — Ambulatory Visit (HOSPITAL_BASED_OUTPATIENT_CLINIC_OR_DEPARTMENT_OTHER): Admitting: Obstetrics & Gynecology

## 2024-03-24 ENCOUNTER — Other Ambulatory Visit (HOSPITAL_COMMUNITY)
Admission: RE | Admit: 2024-03-24 | Discharge: 2024-03-24 | Disposition: A | Discharge status: Home / Self Care | Source: Ambulatory Visit | Attending: Obstetrics & Gynecology | Admitting: Obstetrics & Gynecology

## 2024-03-24 ENCOUNTER — Ambulatory Visit (HOSPITAL_BASED_OUTPATIENT_CLINIC_OR_DEPARTMENT_OTHER): Admitting: Obstetrics & Gynecology

## 2024-03-24 ENCOUNTER — Encounter (HOSPITAL_BASED_OUTPATIENT_CLINIC_OR_DEPARTMENT_OTHER): Payer: Self-pay | Admitting: Obstetrics & Gynecology

## 2024-03-24 VITALS — BP 109/73 | HR 89 | Wt 122.0 lb

## 2024-03-24 DIAGNOSIS — Z1151 Encounter for screening for human papillomavirus (HPV): Secondary | ICD-10-CM

## 2024-03-24 DIAGNOSIS — Z30433 Encounter for removal and reinsertion of intrauterine contraceptive device: Secondary | ICD-10-CM | POA: Diagnosis not present

## 2024-03-24 DIAGNOSIS — I82402 Acute embolism and thrombosis of unspecified deep veins of left lower extremity: Secondary | ICD-10-CM | POA: Diagnosis not present

## 2024-03-24 DIAGNOSIS — Z1331 Encounter for screening for depression: Secondary | ICD-10-CM

## 2024-03-24 DIAGNOSIS — Z124 Encounter for screening for malignant neoplasm of cervix: Secondary | ICD-10-CM

## 2024-03-24 DIAGNOSIS — Z01419 Encounter for gynecological examination (general) (routine) without abnormal findings: Secondary | ICD-10-CM

## 2024-03-24 MED ORDER — LEVONORGESTREL 20 MCG/DAY IU IUD
1.0000 | INTRAUTERINE_SYSTEM | Freq: Once | INTRAUTERINE | Status: AC
  Administered 2024-03-24: 1 via INTRAUTERINE

## 2024-03-24 NOTE — Progress Notes (Unsigned)
 ANNUAL EXAM Patient name: Michele Turner MRN [id number]  Date of birth: 1975/11/06 Chief Complaint:   Annual Exam  History of Present Illness:   Michele Turner is a 48 y.o. G0P0 Caucasian female being seen today for a routine annual exam.   Lots of stress with work.    She has a Mirena  IUD and desires removal and replacement today.  Was placed in 2021.  She has been using testosterone pellets.  She and I discussed risks.  She does want IUD removed and replaced today.  She is aware this is earlier than removal is typically done.  She does feel     No LMP recorded. (Menstrual status: IUD).   Upstream - 03/24/24 1424       Pregnancy Intention Screening   Does the patient want to become pregnant in the next year? No    Does the patient's partner want to become pregnant in the next year? No    Would the patient like to discuss contraceptive options today? Yes      Contraception Wrap Up   Current Method IUD or IUS          Last pap 11/22/2019. Results were: NILM w/ HRHPV negative. H/O abnormal pap: no Last mammogram: 11/17/2023. Results were: normal. Family h/o breast cancer: yes paternal aunt Last colonoscopy: 04/18/2021. Results were: normal. Family h/o colorectal cancer: yes paternal grandmother.  Follow up 10 years.       03/24/2024    2:23 PM 10/22/2021   11:35 AM 05/14/2019   11:55 AM 08/07/2018   10:07 AM  Depression screen PHQ 2/9  Decreased Interest 1 0 0 0  Down, Depressed, Hopeless 0 1 0 1  PHQ - 2 Score 1 1 0 1  Altered sleeping   1 1  Tired, decreased energy   1 1  Change in appetite   0 0  Feeling bad or failure about yourself    1 0  Trouble concentrating   1 2  Moving slowly or fidgety/restless   0 0  Suicidal thoughts   0 0  PHQ-9 Score   4 5  Difficult doing work/chores   Somewhat difficult Somewhat difficult        05/14/2019   11:55 AM 08/07/2018   10:08 AM  GAD 7 : Generalized Anxiety Score  Nervous, Anxious, on Edge 1 1   Control/stop worrying 0 1  Worry too much - different things 0 1  Trouble relaxing 2 1  Restless 0 0  Easily annoyed or irritable 1 0  Afraid - awful might happen 0 1  Total GAD 7 Score 4 5  Anxiety Difficulty Somewhat difficult Somewhat difficult     Review of Systems:   Pertinent items are noted in HPI Denies any bladder or bowel changes.  Denies pelvic pain.   Pertinent History Reviewed:  Reviewed past medical,surgical, social and family history.  Reviewed problem list, medications and allergies. Physical Assessment:   Vitals:   03/24/24 1426  BP: 109/73  Pulse: 89  SpO2: 100%  Weight: 122 lb (55.3 kg)  Body mass index is 20.94 kg/m.        Physical Examination:   General appearance - well appearing, and in no distress  Mental status - alert, oriented to person, place, and time  Psych:  She has a normal mood and affect  Skin - warm and dry, normal color, no suspicious lesions noted  Chest - effort normal, all lung fields clear to  auscultation bilaterally  Heart - normal rate and regular rhythm  Neck:  midline trachea, no thyromegaly or nodules  Breasts - breasts appear normal, no suspicious masses, no skin or nipple changes or  axillary nodes  Abdomen - soft, nontender, nondistended, no masses or organomegaly  Pelvic - VULVA: normal appearing vulva with no masses, tenderness or lesions   VAGINA: normal appearing vagina with normal color and discharge, no lesions   CERVIX: normal appearing cervix without discharge or lesions, no CMT  Thin prep pap updated today  UTERUS: uterus is felt to be normal size, shape, consistency and nontender   ADNEXA: No adnexal masses or tenderness noted.  Rectal - normal rectal, good sphincter tone, no masses felt  Extremities:  No swelling or varicosities noted  Chaperone present for exam  No results found for this or any previous visit (from the past 24 hours).  Assessment & Plan:    No orders of the defined types were placed in  this encounter.   Meds: No orders of the defined types were placed in this encounter.   Follow-up: No follow-ups on file.  Ronal GORMAN Pinal, MD 03/24/2024 2:53 PM

## 2024-03-25 ENCOUNTER — Other Ambulatory Visit: Payer: Self-pay

## 2024-03-25 ENCOUNTER — Encounter (HOSPITAL_COMMUNITY): Payer: Self-pay

## 2024-03-25 ENCOUNTER — Encounter (HOSPITAL_BASED_OUTPATIENT_CLINIC_OR_DEPARTMENT_OTHER): Payer: Self-pay | Admitting: Obstetrics & Gynecology

## 2024-03-25 ENCOUNTER — Emergency Department (HOSPITAL_COMMUNITY)
Admission: EM | Admit: 2024-03-25 | Discharge: 2024-03-25 | Discharge status: Against Medical Advice | Attending: Emergency Medicine | Admitting: Emergency Medicine

## 2024-03-25 ENCOUNTER — Telehealth (HOSPITAL_BASED_OUTPATIENT_CLINIC_OR_DEPARTMENT_OTHER): Payer: Self-pay

## 2024-03-25 ENCOUNTER — Emergency Department (HOSPITAL_COMMUNITY)

## 2024-03-25 DIAGNOSIS — Z5321 Procedure and treatment not carried out due to patient leaving prior to being seen by health care provider: Secondary | ICD-10-CM | POA: Insufficient documentation

## 2024-03-25 DIAGNOSIS — R102 Pelvic and perineal pain: Secondary | ICD-10-CM | POA: Insufficient documentation

## 2024-03-25 LAB — COMPREHENSIVE METABOLIC PANEL WITH GFR
ALT: 19 U/L (ref 0–44)
AST: 29 U/L (ref 15–41)
Albumin: 3.7 g/dL (ref 3.5–5.0)
Alkaline Phosphatase: 48 U/L (ref 38–126)
Anion gap: 8 (ref 5–15)
BUN: 11 mg/dL (ref 6–20)
CO2: 27 mmol/L (ref 22–32)
Calcium: 8.6 mg/dL — ABNORMAL LOW (ref 8.9–10.3)
Chloride: 101 mmol/L (ref 98–111)
Creatinine, Ser: 0.87 mg/dL (ref 0.44–1.00)
GFR, Estimated: >60 mL/min (ref >60)
Glucose, Bld: 129 mg/dL — ABNORMAL HIGH (ref 70–99)
Potassium: 4 mmol/L (ref 3.5–5.1)
Sodium: 136 mmol/L (ref 135–145)
Total Bilirubin: 0.5 mg/dL (ref 0.0–1.2)
Total Protein: 6.1 g/dL — ABNORMAL LOW (ref 6.5–8.1)

## 2024-03-25 LAB — CBC WITH DIFFERENTIAL/PLATELET
Abs Immature Granulocytes: 0.01 K/uL (ref 0.00–0.07)
Basophils Absolute: 0.1 K/uL (ref 0.0–0.1)
Basophils Relative: 1 %
Eosinophils Absolute: 0.4 K/uL (ref 0.0–0.5)
Eosinophils Relative: 6 %
HCT: 37.9 % (ref 36.0–46.0)
Hemoglobin: 12.7 g/dL (ref 12.0–15.0)
Immature Granulocytes: 0 %
Lymphocytes Relative: 21 %
Lymphs Abs: 1.4 K/uL (ref 0.7–4.0)
MCH: 30.7 pg (ref 26.0–34.0)
MCHC: 33.5 g/dL (ref 30.0–36.0)
MCV: 91.5 fL (ref 80.0–100.0)
Monocytes Absolute: 0.7 K/uL (ref 0.1–1.0)
Monocytes Relative: 11 %
Neutro Abs: 4.1 K/uL (ref 1.7–7.7)
Neutrophils Relative %: 61 %
Platelets: 305 K/uL (ref 150–400)
RBC: 4.14 MIL/uL (ref 3.87–5.11)
RDW: 12.2 % (ref 11.5–15.5)
WBC: 6.8 K/uL (ref 4.0–10.5)
nRBC: 0 % (ref 0.0–0.2)

## 2024-03-25 LAB — URINALYSIS, ROUTINE W REFLEX MICROSCOPIC
Bacteria, UA: NONE SEEN
Bilirubin Urine: NEGATIVE
Glucose, UA: NEGATIVE mg/dL
Hgb urine dipstick: NEGATIVE
Ketones, ur: NEGATIVE mg/dL
Leukocytes,Ua: NEGATIVE
Nitrite: NEGATIVE
Protein, ur: NEGATIVE mg/dL
Specific Gravity, Urine: 1.02 (ref 1.005–1.030)
pH: 7 (ref 5.0–8.0)

## 2024-03-25 LAB — HCG, SERUM, QUALITATIVE: Preg, Serum: NEGATIVE

## 2024-03-25 MED ORDER — IBUPROFEN 400 MG PO TABS: 400.0000 mg | ORAL_TABLET | Freq: Once | ORAL | Status: DC | PRN

## 2024-03-25 MED ORDER — IBUPROFEN 400 MG PO TABS
ORAL_TABLET | ORAL | Status: AC
  Filled 2024-03-25: qty 1

## 2024-03-25 NOTE — Telephone Encounter (Signed)
 Patient was seen in office 03/24/2024 for annual exam with IUD removal and reinsertion. Patient reports last night around midnight she began having pelvic pain. Got up to use the restroom and had a vasovagal response. Patient was seen in the ED and had a transvaginal PUS completed which showed the IUD positioned in the correct cavity with no myometrial perforation suspected. No evidence of ovarian torsion. Patient states that her pain today is a 1/10. Denies any dizziness or vision changes. Advised I reviewed the ultrasound results with Dr.Miller and these are normal. With patient's current pain level okay to monitor. Motrin  instructions given. Motrin =Advil =Ibuprofen  800 mg (can take four 200 mg pills) every 8 hours as needed.Take with food. Advised cramping may continue for 2 days following IUD insertion. Advised if pain worsens to contact the office for further evaluation. Patient is agreeable.

## 2024-03-25 NOTE — ED Notes (Signed)
 Pt state she is leaving because she has work in the morning.

## 2024-03-25 NOTE — ED Triage Notes (Signed)
 Pt just had her IUD replaced with the same brand as she has had before, she states at midnight she woke up with extreme cramping pain, no bleeding. She is coming in with concerns of potential uterine perforation. Pt is otherwise stable at this time

## 2024-03-25 NOTE — Telephone Encounter (Signed)
 F/u  Asking for call back to discuss ultrasound results.

## 2024-03-29 ENCOUNTER — Ambulatory Visit (HOSPITAL_BASED_OUTPATIENT_CLINIC_OR_DEPARTMENT_OTHER): Payer: Self-pay | Admitting: Obstetrics & Gynecology

## 2024-03-29 LAB — CYTOLOGY - PAP
Comment: NEGATIVE
Diagnosis: NEGATIVE
High risk HPV: NEGATIVE

## 2024-05-05 ENCOUNTER — Encounter (HOSPITAL_BASED_OUTPATIENT_CLINIC_OR_DEPARTMENT_OTHER): Payer: Self-pay | Admitting: Obstetrics & Gynecology

## 2024-05-05 ENCOUNTER — Ambulatory Visit (HOSPITAL_BASED_OUTPATIENT_CLINIC_OR_DEPARTMENT_OTHER): Admitting: Obstetrics & Gynecology

## 2024-05-05 VITALS — BP 125/74 | HR 75 | Ht 64.0 in | Wt 122.4 lb

## 2024-05-05 DIAGNOSIS — Z30431 Encounter for routine checking of intrauterine contraceptive device: Secondary | ICD-10-CM
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
## 2024-05-10 ENCOUNTER — Encounter: Payer: Self-pay | Admitting: Radiology
